# Patient Record
Sex: Female | Born: 1950 | Race: White | Hispanic: No | Smoking: Former smoker
Health system: Southern US, Community
[De-identification: ages and names within clinical notes are randomized; demographics above are authoritative.]

## PROBLEM LIST (undated history)

## (undated) DIAGNOSIS — IMO0002 Reserved for concepts with insufficient information to code with codable children: Secondary | ICD-10-CM

## (undated) DIAGNOSIS — N952 Postmenopausal atrophic vaginitis: Secondary | ICD-10-CM

## (undated) DIAGNOSIS — K7581 Nonalcoholic steatohepatitis (NASH): Secondary | ICD-10-CM

## (undated) DIAGNOSIS — I7300 Raynaud's syndrome without gangrene: Secondary | ICD-10-CM

## (undated) DIAGNOSIS — N814 Uterovaginal prolapse, unspecified: Secondary | ICD-10-CM

## (undated) DIAGNOSIS — I10 Essential (primary) hypertension: Secondary | ICD-10-CM

## (undated) DIAGNOSIS — G629 Polyneuropathy, unspecified: Secondary | ICD-10-CM

## (undated) DIAGNOSIS — R7303 Prediabetes: Secondary | ICD-10-CM

## (undated) DIAGNOSIS — F419 Anxiety disorder, unspecified: Secondary | ICD-10-CM

## (undated) DIAGNOSIS — J45909 Unspecified asthma, uncomplicated: Secondary | ICD-10-CM

## (undated) DIAGNOSIS — N816 Rectocele: Secondary | ICD-10-CM

## (undated) DIAGNOSIS — F32A Depression, unspecified: Secondary | ICD-10-CM

## (undated) DIAGNOSIS — N950 Postmenopausal bleeding: Secondary | ICD-10-CM

## (undated) DIAGNOSIS — L309 Dermatitis, unspecified: Secondary | ICD-10-CM

## (undated) DIAGNOSIS — M329 Systemic lupus erythematosus, unspecified: Secondary | ICD-10-CM

## (undated) DIAGNOSIS — N390 Urinary tract infection, site not specified: Secondary | ICD-10-CM

## (undated) DIAGNOSIS — I219 Acute myocardial infarction, unspecified: Secondary | ICD-10-CM

## (undated) DIAGNOSIS — N898 Other specified noninflammatory disorders of vagina: Secondary | ICD-10-CM

## (undated) DIAGNOSIS — I2510 Atherosclerotic heart disease of native coronary artery without angina pectoris: Secondary | ICD-10-CM

## (undated) DIAGNOSIS — E785 Hyperlipidemia, unspecified: Secondary | ICD-10-CM

## (undated) DIAGNOSIS — R350 Frequency of micturition: Secondary | ICD-10-CM

## (undated) DIAGNOSIS — M359 Systemic involvement of connective tissue, unspecified: Secondary | ICD-10-CM

## (undated) DIAGNOSIS — N3289 Other specified disorders of bladder: Secondary | ICD-10-CM

## (undated) DIAGNOSIS — M1990 Unspecified osteoarthritis, unspecified site: Secondary | ICD-10-CM

## (undated) DIAGNOSIS — K219 Gastro-esophageal reflux disease without esophagitis: Secondary | ICD-10-CM

## (undated) DIAGNOSIS — N393 Stress incontinence (female) (male): Secondary | ICD-10-CM

## (undated) HISTORY — DX: Rectocele: N81.6

## (undated) HISTORY — DX: Anxiety disorder, unspecified: F41.9

## (undated) HISTORY — DX: Postmenopausal bleeding: N95.0

## (undated) HISTORY — PX: CARDIAC CATHETERIZATION: SHX172

## (undated) HISTORY — DX: Unspecified asthma, uncomplicated: J45.909

## (undated) HISTORY — DX: Hyperlipidemia, unspecified: E78.5

## (undated) HISTORY — DX: Depression, unspecified: F32.A

## (undated) HISTORY — PX: OTHER SURGICAL HISTORY: SHX169

## (undated) HISTORY — DX: Essential (primary) hypertension: I10

## (undated) HISTORY — DX: Stress incontinence (female) (male): N39.3

## (undated) HISTORY — PX: CORONARY ANGIOPLASTY: SHX604

## (undated) HISTORY — DX: Other specified disorders of bladder: N32.89

## (undated) HISTORY — DX: Reserved for concepts with insufficient information to code with codable children: IMO0002

## (undated) HISTORY — DX: Uterovaginal prolapse, unspecified: N81.4

## (undated) HISTORY — DX: Other specified noninflammatory disorders of vagina: N89.8

## (undated) HISTORY — DX: Postmenopausal atrophic vaginitis: N95.2

## (undated) HISTORY — DX: Frequency of micturition: R35.0

---
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
## 1984-02-13 HISTORY — PX: TUBAL LIGATION: SHX77

## 2004-08-28 ENCOUNTER — Emergency Department: Payer: Self-pay | Admitting: Emergency Medicine

## 2005-08-22 ENCOUNTER — Ambulatory Visit: Payer: Self-pay | Admitting: Internal Medicine

## 2006-02-14 ENCOUNTER — Ambulatory Visit: Payer: Self-pay | Admitting: Nurse Practitioner

## 2009-03-07 ENCOUNTER — Ambulatory Visit: Payer: Self-pay | Admitting: Urology

## 2009-09-21 ENCOUNTER — Ambulatory Visit: Payer: Self-pay | Admitting: Nurse Practitioner

## 2011-01-10 ENCOUNTER — Ambulatory Visit: Payer: Self-pay | Admitting: Nurse Practitioner

## 2011-09-15 ENCOUNTER — Emergency Department: Payer: Self-pay | Admitting: Emergency Medicine

## 2011-09-15 LAB — CBC
HCT: 37.1 % (ref 35.0–47.0)
MCH: 30.9 pg (ref 26.0–34.0)
MCHC: 35 g/dL (ref 32.0–36.0)
MCV: 88 fL (ref 80–100)
RDW: 14.1 % (ref 11.5–14.5)

## 2011-09-15 LAB — COMPREHENSIVE METABOLIC PANEL
Albumin: 3.5 g/dL (ref 3.4–5.0)
Alkaline Phosphatase: 74 U/L (ref 50–136)
Anion Gap: 11 (ref 7–16)
BUN: 14 mg/dL (ref 7–18)
Bilirubin,Total: 0.3 mg/dL (ref 0.2–1.0)
Calcium, Total: 8.9 mg/dL (ref 8.5–10.1)
Chloride: 108 mmol/L — ABNORMAL HIGH (ref 98–107)
Co2: 26 mmol/L (ref 21–32)
Creatinine: 0.96 mg/dL (ref 0.60–1.30)
EGFR (African American): >60
EGFR (Non-African Amer.): >60
Osmolality: 292 (ref 275–301)
Potassium: 3.5 mmol/L (ref 3.5–5.1)
Sodium: 145 mmol/L (ref 136–145)

## 2011-09-15 LAB — TROPONIN I
Troponin-I: <0.02 ng/mL
Troponin-I: <0.02 ng/mL

## 2011-09-15 LAB — LIPASE, BLOOD: Lipase: 198 U/L (ref 73–393)

## 2011-09-18 ENCOUNTER — Ambulatory Visit: Payer: Self-pay | Admitting: Cardiology

## 2011-09-18 DIAGNOSIS — I2510 Atherosclerotic heart disease of native coronary artery without angina pectoris: Secondary | ICD-10-CM

## 2011-09-18 HISTORY — PX: LEFT HEART CATH AND CORONARY ANGIOGRAPHY: CATH118249

## 2011-09-18 HISTORY — DX: Atherosclerotic heart disease of native coronary artery without angina pectoris: I25.10

## 2011-10-16 ENCOUNTER — Ambulatory Visit: Payer: Self-pay | Admitting: Cardiology

## 2011-10-16 HISTORY — PX: CORONARY STENT INTERVENTION: CATH118234

## 2011-10-16 LAB — CK TOTAL AND CKMB (NOT AT ARMC)
CK, Total: 97 U/L (ref 21–215)
CK-MB: 1.1 ng/mL (ref 0.5–3.6)

## 2011-10-17 LAB — BASIC METABOLIC PANEL
Anion Gap: 4 — ABNORMAL LOW (ref 7–16)
Calcium, Total: 8.7 mg/dL (ref 8.5–10.1)
Chloride: 110 mmol/L — ABNORMAL HIGH (ref 98–107)
Co2: 29 mmol/L (ref 21–32)
Osmolality: 284 (ref 275–301)

## 2012-03-06 ENCOUNTER — Ambulatory Visit: Payer: Self-pay | Admitting: Family Medicine

## 2013-08-31 DIAGNOSIS — I2510 Atherosclerotic heart disease of native coronary artery without angina pectoris: Secondary | ICD-10-CM | POA: Insufficient documentation

## 2013-08-31 DIAGNOSIS — M359 Systemic involvement of connective tissue, unspecified: Secondary | ICD-10-CM | POA: Insufficient documentation

## 2014-01-14 ENCOUNTER — Ambulatory Visit: Payer: Self-pay | Admitting: Obstetrics and Gynecology

## 2014-01-14 LAB — BASIC METABOLIC PANEL
Anion Gap: 8 (ref 7–16)
BUN: 11 mg/dL (ref 7–18)
CO2: 29 mmol/L (ref 21–32)
Calcium, Total: 9.3 mg/dL (ref 8.5–10.1)
Chloride: 106 mmol/L (ref 98–107)
Creatinine: 0.82 mg/dL (ref 0.60–1.30)
EGFR (Non-African Amer.): >60
Glucose: 91 mg/dL (ref 65–99)
Osmolality: 284 (ref 275–301)
POTASSIUM: 4.2 mmol/L (ref 3.5–5.1)
Sodium: 143 mmol/L (ref 136–145)

## 2014-01-14 LAB — CBC
HCT: 42.1 % (ref 35.0–47.0)
HGB: 14.1 g/dL (ref 12.0–16.0)
MCH: 30.9 pg (ref 26.0–34.0)
MCHC: 33.4 g/dL (ref 32.0–36.0)
MCV: 93 fL (ref 80–100)
Platelet: 256 10*3/uL (ref 150–440)
RBC: 4.55 10*6/uL (ref 3.80–5.20)
RDW: 13.5 % (ref 11.5–14.5)
WBC: 8.1 10*3/uL (ref 3.6–11.0)

## 2014-01-18 ENCOUNTER — Inpatient Hospital Stay: Payer: Self-pay | Admitting: Obstetrics and Gynecology

## 2014-01-18 HISTORY — PX: OTHER SURGICAL HISTORY: SHX169

## 2014-01-18 LAB — PROTIME-INR
INR: 1.1
PROTHROMBIN TIME: 13.6 s (ref 11.5–14.7)

## 2014-01-19 HISTORY — PX: TOTAL ABDOMINAL HYSTERECTOMY W/ BILATERAL SALPINGOOPHORECTOMY: SHX83

## 2014-01-19 HISTORY — PX: ABDOMINAL HYSTERECTOMY: SHX81

## 2014-01-19 LAB — BASIC METABOLIC PANEL WITH GFR
Anion Gap: 5 — ABNORMAL LOW
BUN: 11 mg/dL
Calcium, Total: 8.1 mg/dL — ABNORMAL LOW
Chloride: 105 mmol/L
Co2: 28 mmol/L
Creatinine: 0.99 mg/dL
EGFR (African American): >60
EGFR (Non-African Amer.): >60
Glucose: 110 mg/dL — ABNORMAL HIGH
Osmolality: 276
Potassium: 3.7 mmol/L
Sodium: 138 mmol/L

## 2014-01-19 LAB — CBC WITH DIFFERENTIAL/PLATELET
Basophil #: 0 x10 3/mm 3
Basophil %: 0.1 %
Eosinophil #: 0 x10 3/mm 3
Eosinophil %: 0 %
HCT: 29.1 % — ABNORMAL LOW
HGB: 9.3 g/dL — ABNORMAL LOW
Lymphocyte %: 9.1 %
Lymphs Abs: 1.6 x10 3/mm 3
MCH: 30.2 pg
MCHC: 31.9 g/dL — ABNORMAL LOW
MCV: 95 fL
Monocyte #: 1.3 "x10 3/mm " — ABNORMAL HIGH
Monocyte %: 7.3 %
Neutrophil #: 14.9 x10 3/mm 3 — ABNORMAL HIGH
Neutrophil %: 83.5 %
Platelet: 181 x10 3/mm 3
RBC: 3.08 X10 6/mm 3 — ABNORMAL LOW
RDW: 13.4 %
WBC: 17.8 x10 3/mm 3 — ABNORMAL HIGH

## 2014-01-20 LAB — HEMOGLOBIN: HGB: 9.5 g/dL — AB (ref 12.0–16.0)

## 2014-01-20 LAB — CREATININE, SERUM
Creatinine: 0.94 mg/dL (ref 0.60–1.30)
EGFR (Non-African Amer.): >60

## 2014-01-22 LAB — CREATININE, SERUM
CREATININE: 0.77 mg/dL (ref 0.60–1.30)
EGFR (African American): >60
EGFR (Non-African Amer.): >60

## 2014-01-24 LAB — CREATININE, SERUM
Creatinine: 0.91 mg/dL (ref 0.60–1.30)
EGFR (African American): >60

## 2014-01-24 LAB — VANCOMYCIN, TROUGH: Vancomycin, Trough: 11 ug/mL (ref 10–20)

## 2014-01-25 LAB — CREATININE, SERUM
Creatinine: 1 mg/dL (ref 0.60–1.30)
EGFR (African American): >60
EGFR (Non-African Amer.): 60 — ABNORMAL LOW

## 2014-01-26 LAB — CBC WITH DIFFERENTIAL/PLATELET
BANDS NEUTROPHIL: 3 %
Eosinophil: 4 %
HCT: 28.1 % — AB (ref 35.0–47.0)
HGB: 9.4 g/dL — ABNORMAL LOW (ref 12.0–16.0)
Lymphocytes: 19 %
MCH: 31.1 pg (ref 26.0–34.0)
MCHC: 33.6 g/dL (ref 32.0–36.0)
MCV: 92 fL (ref 80–100)
METAMYELOCYTE: 1 %
MONOS PCT: 2 %
Myelocyte: 1 %
Platelet: 385 10*3/uL (ref 150–440)
RBC: 3.04 10*6/uL — ABNORMAL LOW (ref 3.80–5.20)
RDW: 13.6 % (ref 11.5–14.5)
Segmented Neutrophils: 70 %
WBC: 6.4 10*3/uL (ref 3.6–11.0)

## 2014-01-26 LAB — CREATININE, SERUM
Creatinine: 1.08 mg/dL (ref 0.60–1.30)
GFR CALC NON AF AMER: 55 — AB

## 2014-01-26 LAB — VANCOMYCIN, TROUGH
VANCOMYCIN, TROUGH: 17 ug/mL (ref 10–20)
Vancomycin, Trough: 26 ug/mL (ref 10–20)
Vancomycin, Trough: 30 ug/mL (ref 10–20)

## 2014-01-27 LAB — URINALYSIS, COMPLETE
BILIRUBIN, UR: NEGATIVE
Glucose,UR: NEGATIVE mg/dL (ref 0–75)
Hyaline Cast: 2
Ketone: NEGATIVE
NITRITE: NEGATIVE
PH: 6 (ref 4.5–8.0)
RBC,UR: 245 /HPF (ref 0–5)
Specific Gravity: 1.018 (ref 1.003–1.030)
Squamous Epithelial: NONE SEEN

## 2014-01-27 LAB — CBC WITH DIFFERENTIAL/PLATELET
Bands: 11 %
Comment - H1-Com2: NORMAL
Eosinophil: 3 %
HCT: 26.4 % — ABNORMAL LOW (ref 35.0–47.0)
HGB: 8.6 g/dL — ABNORMAL LOW (ref 12.0–16.0)
Lymphocytes: 12 %
MCH: 30.3 pg (ref 26.0–34.0)
MCHC: 32.5 g/dL (ref 32.0–36.0)
MCV: 93 fL (ref 80–100)
Metamyelocyte: 5 %
Monocytes: 6 %
Myelocyte: 5 %
Platelet: 305 10*3/uL (ref 150–440)
RBC: 2.83 10*6/uL — AB (ref 3.80–5.20)
RDW: 13.1 % (ref 11.5–14.5)
SEGMENTED NEUTROPHILS: 58 %
WBC: 6.3 10*3/uL (ref 3.6–11.0)

## 2014-01-27 LAB — COMPREHENSIVE METABOLIC PANEL
ALK PHOS: 60 U/L
ALT: 31 U/L
Albumin: 2.4 g/dL — ABNORMAL LOW (ref 3.4–5.0)
Anion Gap: 5 — ABNORMAL LOW (ref 7–16)
BUN: 12 mg/dL (ref 7–18)
Bilirubin,Total: 0.2 mg/dL (ref 0.2–1.0)
CALCIUM: 8.3 mg/dL — AB (ref 8.5–10.1)
CHLORIDE: 103 mmol/L (ref 98–107)
CREATININE: 0.98 mg/dL (ref 0.60–1.30)
Co2: 29 mmol/L (ref 21–32)
EGFR (African American): >60
EGFR (Non-African Amer.): >60
GLUCOSE: 94 mg/dL (ref 65–99)
Osmolality: 273 (ref 275–301)
Potassium: 4.1 mmol/L (ref 3.5–5.1)
SGOT(AST): 36 U/L (ref 15–37)
Sodium: 137 mmol/L (ref 136–145)
Total Protein: 6.8 g/dL (ref 6.4–8.2)

## 2014-01-28 LAB — URINE CULTURE

## 2014-01-29 LAB — COMPREHENSIVE METABOLIC PANEL
ALBUMIN: 2.4 g/dL — AB (ref 3.4–5.0)
ALT: 39 U/L
Alkaline Phosphatase: 55 U/L
Anion Gap: 8 (ref 7–16)
BILIRUBIN TOTAL: 0.2 mg/dL (ref 0.2–1.0)
BUN: 14 mg/dL (ref 7–18)
CHLORIDE: 103 mmol/L (ref 98–107)
CREATININE: 1.15 mg/dL (ref 0.60–1.30)
Calcium, Total: 8.1 mg/dL — ABNORMAL LOW (ref 8.5–10.1)
Co2: 24 mmol/L (ref 21–32)
EGFR (African American): >60
EGFR (Non-African Amer.): 51 — ABNORMAL LOW
GLUCOSE: 93 mg/dL (ref 65–99)
OSMOLALITY: 270 (ref 275–301)
Potassium: 3.9 mmol/L (ref 3.5–5.1)
SGOT(AST): 52 U/L — ABNORMAL HIGH (ref 15–37)
Sodium: 135 mmol/L — ABNORMAL LOW (ref 136–145)
TOTAL PROTEIN: 6.6 g/dL (ref 6.4–8.2)

## 2014-01-29 LAB — CBC WITH DIFFERENTIAL/PLATELET
Bands: 7 %
Comment - H1-Com1: NORMAL
Comment - H1-Com2: NORMAL
Eosinophil: 1 %
HCT: 24.3 % — ABNORMAL LOW (ref 35.0–47.0)
HGB: 8 g/dL — ABNORMAL LOW (ref 12.0–16.0)
LYMPHS PCT: 18 %
MCH: 30.6 pg (ref 26.0–34.0)
MCHC: 33 g/dL (ref 32.0–36.0)
MCV: 93 fL (ref 80–100)
MYELOCYTE: 2 %
Metamyelocyte: 7 %
Monocytes: 5 %
PLATELETS: 279 10*3/uL (ref 150–440)
RBC: 2.62 10*6/uL — ABNORMAL LOW (ref 3.80–5.20)
RDW: 13.4 % (ref 11.5–14.5)
Segmented Neutrophils: 60 %
WBC: 4.1 10*3/uL (ref 3.6–11.0)

## 2014-01-29 LAB — VANCOMYCIN, TROUGH: VANCOMYCIN, TROUGH: 22 ug/mL — AB (ref 10–20)

## 2014-01-30 LAB — SEDIMENTATION RATE: Erythrocyte Sed Rate: 128 mm/hr — ABNORMAL HIGH (ref 0–30)

## 2014-01-30 LAB — CBC WITH DIFFERENTIAL/PLATELET
Basophil #: 0 10*3/uL (ref 0.0–0.1)
Basophil %: 0.5 %
EOS ABS: 0 10*3/uL (ref 0.0–0.7)
Eosinophil %: 0.3 %
HCT: 24 % — ABNORMAL LOW (ref 35.0–47.0)
HGB: 8.2 g/dL — AB (ref 12.0–16.0)
LYMPHS PCT: 12.9 %
Lymphocyte #: 0.8 10*3/uL — ABNORMAL LOW (ref 1.0–3.6)
MCH: 30.8 pg (ref 26.0–34.0)
MCHC: 34.1 g/dL (ref 32.0–36.0)
MCV: 90 fL (ref 80–100)
MONOS PCT: 3.1 %
Monocyte #: 0.2 x10 3/mm (ref 0.2–0.9)
Neutrophil #: 4.9 10*3/uL (ref 1.4–6.5)
Neutrophil %: 83.2 %
PLATELETS: 323 10*3/uL (ref 150–440)
RBC: 2.66 10*6/uL — ABNORMAL LOW (ref 3.80–5.20)
RDW: 13.7 % (ref 11.5–14.5)
WBC: 5.9 10*3/uL (ref 3.6–11.0)

## 2014-01-30 LAB — BASIC METABOLIC PANEL
Anion Gap: 9 (ref 7–16)
BUN: 18 mg/dL (ref 7–18)
CHLORIDE: 100 mmol/L (ref 98–107)
Calcium, Total: 8 mg/dL — ABNORMAL LOW (ref 8.5–10.1)
Co2: 22 mmol/L (ref 21–32)
Creatinine: 1.29 mg/dL (ref 0.60–1.30)
GFR CALC AF AMER: 54 — AB
GFR CALC NON AF AMER: 45 — AB
Glucose: 100 mg/dL — ABNORMAL HIGH (ref 65–99)
OSMOLALITY: 265 (ref 275–301)
POTASSIUM: 3.7 mmol/L (ref 3.5–5.1)
Sodium: 131 mmol/L — ABNORMAL LOW (ref 136–145)

## 2014-01-30 LAB — LACTATE DEHYDROGENASE: LDH: 466 U/L — AB (ref 81–246)

## 2014-01-31 LAB — VANCOMYCIN, TROUGH: VANCOMYCIN, TROUGH: 11 ug/mL (ref 10–20)

## 2014-01-31 LAB — CBC WITH DIFFERENTIAL/PLATELET
Bands: 22 %
COMMENT - H1-COM3: NORMAL
HCT: 25.2 % — ABNORMAL LOW (ref 35.0–47.0)
HGB: 8.5 g/dL — ABNORMAL LOW (ref 12.0–16.0)
Lymphocytes: 7 %
MCH: 30.7 pg (ref 26.0–34.0)
MCHC: 33.7 g/dL (ref 32.0–36.0)
MCV: 91 fL (ref 80–100)
Metamyelocyte: 8 %
Monocytes: 4 %
Myelocyte: 1 %
PLATELETS: 332 10*3/uL (ref 150–440)
RBC: 2.76 10*6/uL — ABNORMAL LOW (ref 3.80–5.20)
RDW: 13.8 % (ref 11.5–14.5)
Segmented Neutrophils: 58 %
WBC: 5 10*3/uL (ref 3.6–11.0)

## 2014-01-31 LAB — INFLUENZA A,B,H1N1 - PCR (ARMC)
H1N1 flu by pcr: NOT DETECTED
Influenza A By PCR: NEGATIVE
Influenza B By PCR: NEGATIVE

## 2014-01-31 LAB — CULTURE, BLOOD (SINGLE)

## 2014-01-31 LAB — CREATININE, SERUM
CREATININE: 1.2 mg/dL (ref 0.60–1.30)
Creatinine: 0.78 mg/dL (ref 0.60–1.30)
EGFR (African American): >60
EGFR (Non-African Amer.): >60
GFR CALC AF AMER: 59 — AB
GFR CALC NON AF AMER: 48 — AB

## 2014-02-01 LAB — CULTURE, BLOOD (SINGLE)

## 2014-02-01 LAB — CK: CK, TOTAL: 79 U/L (ref 26–192)

## 2014-02-02 LAB — CBC WITH DIFFERENTIAL/PLATELET
BANDS NEUTROPHIL: 4 %
Basophil: 2 %
COMMENT - H1-COM1: NORMAL
COMMENT - H1-COM2: NORMAL
Eosinophil: 3 %
HCT: 23.7 % — AB (ref 35.0–47.0)
HGB: 7.7 g/dL — AB (ref 12.0–16.0)
Lymphocytes: 22 %
MCH: 29.8 pg (ref 26.0–34.0)
MCHC: 32.3 g/dL (ref 32.0–36.0)
MCV: 92 fL (ref 80–100)
METAMYELOCYTE: 3 %
Monocytes: 6 %
Myelocyte: 2 %
Platelet: 405 10*3/uL (ref 150–440)
RBC: 2.57 10*6/uL — ABNORMAL LOW (ref 3.80–5.20)
RDW: 13.8 % (ref 11.5–14.5)
Segmented Neutrophils: 58 %
WBC: 5.5 10*3/uL (ref 3.6–11.0)

## 2014-02-02 LAB — CLOSTRIDIUM DIFFICILE(ARMC)

## 2014-02-02 LAB — RETICULOCYTES
Absolute Retic Count: 0.0566 10*6/uL (ref 0.019–0.186)
RETICULOCYTE: 2.22 % (ref 0.4–3.1)

## 2014-02-02 LAB — BASIC METABOLIC PANEL
Anion Gap: 6 — ABNORMAL LOW (ref 7–16)
BUN: 11 mg/dL (ref 7–18)
Calcium, Total: 8.6 mg/dL (ref 8.5–10.1)
Chloride: 110 mmol/L — ABNORMAL HIGH (ref 98–107)
Co2: 26 mmol/L (ref 21–32)
Creatinine: 0.89 mg/dL (ref 0.60–1.30)
EGFR (Non-African Amer.): >60
Glucose: 86 mg/dL (ref 65–99)
Osmolality: 282 (ref 275–301)
Potassium: 4.3 mmol/L (ref 3.5–5.1)
Sodium: 142 mmol/L (ref 136–145)

## 2014-02-02 LAB — FERRITIN: Ferritin (ARMC): 1554 ng/mL — ABNORMAL HIGH (ref 8–388)

## 2014-02-02 LAB — OCCULT BLOOD X 1 CARD TO LAB, STOOL: Occult Blood, Feces: NEGATIVE

## 2014-02-03 LAB — CBC WITH DIFFERENTIAL/PLATELET
Bands: 4 %
Comment - H1-Com2: NORMAL
Eosinophil: 3 %
HCT: 25.8 % — ABNORMAL LOW (ref 35.0–47.0)
HGB: 8.4 g/dL — ABNORMAL LOW (ref 12.0–16.0)
Lymphocytes: 20 %
MCH: 30.1 pg (ref 26.0–34.0)
MCHC: 32.7 g/dL (ref 32.0–36.0)
MCV: 92 fL (ref 80–100)
Metamyelocyte: 6 %
Monocytes: 11 %
Myelocyte: 2 %
Platelet: 493 10*3/uL — ABNORMAL HIGH (ref 150–440)
RBC: 2.8 10*6/uL — ABNORMAL LOW (ref 3.80–5.20)
RDW: 13.9 % (ref 11.5–14.5)
Segmented Neutrophils: 54 %
WBC: 9.6 10*3/uL (ref 3.6–11.0)

## 2014-02-03 LAB — CULTURE, BLOOD (SINGLE)

## 2014-03-26 ENCOUNTER — Ambulatory Visit: Payer: Self-pay | Admitting: Urology

## 2014-06-01 ENCOUNTER — Ambulatory Visit
Admit: 2014-06-01 | Disposition: A | Discharge status: Home / Self Care | Payer: Self-pay | Attending: Family Medicine | Admitting: Family Medicine

## 2014-06-01 NOTE — Discharge Summary (Signed)
PATIENT NAME:  [name], [name] MR#:  [id number] DATE OF BIRTH:  13-Aug-1950  DATE OF ADMISSION:  10/16/2011 DATE OF DISCHARGE:  10/17/2011  PRIMARY CARE PHYSICIAN:  Dr. Kary Kos CARDIOLOGIST:  Dr. Ubaldo Glassing   FINAL DIAGNOSES: 1. Coronary artery disease  2. Connective tissue disease. 3. Raynaud's.  DISCHARGE MEDICATIONS:  1. Aspirin 325 mg daily.  2. Clopidogrel 75 mg daily.  3. Effexor 37.5 mg daily.  4. Multivitamin 1 daily.  5. Estrace vaginal cream 0.1 mg/g.  6. Vitamin D3 2000 international units daily.  7. Fish oil caps 500 mg daily.  8. Glucosamine 750 mg daily.  9. Prednisone 5 mg daily.  10. Amlodipine 2.5 mg daily.  11. Hydroxychloroquine 200 mg daily.  12. Detrol 2 mg b.i.d.  13. Venlafaxine 2 tabs daily.  PROCEDURES: Percutaneous coronary intervention with coronary stent on 10/16/2011.   HISTORY OF PRESENT ILLNESS: Please see admission History and Physical.   HOSPITAL COURSE: The patient underwent elective PCI on 10/16/2011. The patient received a 2.25 x 12-mm Xience coronary stent in the mid left anterior descending coronary artery with an excellent angiographic result. Postprocedural CPK and MB were 97 and 1.1, respectively.   On the morning of 10/17/2011 the patient was ambulating without difficulty and was discharged home in stable condition. She is scheduled to follow up with Dr. Ubaldo Glassing in one week.     ____________________________ Isaias Cowman, MD ap:bjt D: 10/17/2011 08:32:55 ET T: 10/18/2011 14:05:13 ET JOB#: [id number]  cc: Isaias Cowman, MD, <Dictator> Isaias Cowman MD ELECTRONICALLY SIGNED 11/04/2011 13:52

## 2014-06-05 NOTE — Consult Note (Signed)
Chief Complaint:  Subjective/Chief Complaint Spiked fever to 103 shortly after removing abd JP drain and nephrostomy tube.  Continues to spike tepts up to 104.6.  Normotensive but borderline tachycardia associated wtih fevers, currently afebrile.  Aztrezonam added to Vanc per ID, blood cultures and urine culture pending.  No flank pain.   VITAL SIGNS/ANCILLARY NOTES: **Vital Signs.:   16-Dec-15 07:33  Vital Signs Type Routine  Temperature Temperature (F) 98.9  Celsius 37.1  Temperature Source oral  Pulse Pulse 79  Respirations Respirations 18  Systolic BP Systolic BP 98  Diastolic BP (mmHg) Diastolic BP (mmHg) 58  Mean BP 71  Pulse Ox % Pulse Ox % 96  Pulse Ox Activity Level  At rest  Oxygen Delivery Room Air/ 21 %  *Intake and Output.:   Daily 16-Dec-15 07:00  Grand Totals Intake:  775 Output:  3500    Net:  -[id number] 24 Hr.:  -2725  IV (Primary)      In:  775  Urine ml     Out:  3500  Length of Stay Totals Intake:  13866 Output:  31885    Net:  -[id number]   Brief Assessment:  GEN well developed, well nourished   Cardiac Regular   Respiratory normal resp effort   Gastrointestinal Normal  soft, NT, ND. JP site and nephrostomy site c/d/i   Gastrointestinal details normal Soft  Nontender  No masses palpable  No rebound tenderness   EXTR negative cyanosis/clubbing   Additional Physical Exam Foley catheter in place draining clear yellow urine   Lab Results: Routine Micro:  15-Dec-15 10:41   Micro Text Report BLOOD CULTURE   COMMENT                   NO GROWTH IN 8-12 HOURS   ANTIBIOTIC                       Specimen Source l-arm  Culture Comment NO GROWTH IN 8-12 HOURS  Result(s) reported on 26 Jan 2014 at 06:00PM.    10:44   Micro Text Report BLOOD CULTURE   COMMENT                   NO GROWTH IN 8-12 HOURS   ANTIBIOTIC                       Specimen Source l-hand  Culture Comment NO GROWTH IN 8-12 HOURS  Result(s) reported on 26 Jan 2014 at 06:00PM.  Routine  Chem:  15-Dec-15 10:41   Result Comment PLATELETS - SLIGHT PLATELET CLUMPING IN SPECIMEN. ACTUAL  - NUMERICAL COUNT MAY BE SOMEWHAT HIGHER THAN  - THE REPORTED VALUE.  Result(s) reported on 26 Jan 2014 at 12:01PM.  Routine Hem:  15-Dec-15 10:41   WBC (CBC) 6.4  RBC (CBC)  3.04  Hemoglobin (CBC)  9.4  Hematocrit (CBC)  28.1  Platelet Count (CBC) 385  MCV 92  MCH 31.1  MCHC 33.6  RDW 13.6  Bands 3  Segmented Neutrophils 70  Lymphocytes 19  Monocytes 2  Eosinophil 4  Metamyelocyte 1  Myelocyte 1  Diff Comment 2 ANISOCYTOSIS  Diff Comment 3 HYPOCHROMIA  Diff Comment 4 PLTS VARIED IN SIZE  Result(s) reported on 26 Jan 2014 at 12:01PM.   Assessment/Plan:  Invasive Device Daily Assessment of Necessity:  Does the patient currently have any of the following indwelling devices? foley   Indwelling Urinary Catheter continued, requirement due to  Reason to continue Indwelling Urinary Catheter bladder injury   Assessment/Plan:  Assessment 64 year old female postop day 9 status post abdominal hysterectomy with intraoperative bladder injury near the level of the trigone.  Antegrade right double-J ureteral stent placed with distal ureteral perforation, unable to extract in antegrade fashion.  She is now POD2 s/p right ureteroscopy with extraction of malpositioned double-J, replacement.  Febrile yesterday after nephrostomy/ JP removed.  ID following, continues to spike fevers.   Plan -f/u cultures/ ID recs -Right double-J ureteral stent remains in good position -Foley catheter in place -Will arrange for cystogram and Foley catheter removal next week in urology clinic -Plan to maintain right ureteral stent for 4-6 weeks postop   Electronic Signatures: Sherlynn Stalls (MD)  (Signed 16-Dec-15 10:01)  Authored: Chief Complaint, VITAL SIGNS/ANCILLARY NOTES, Brief Assessment, Lab Results, Assessment/Plan   Last Updated: 16-Dec-15 10:01 by Sherlynn Stalls (MD)

## 2014-06-05 NOTE — Op Note (Signed)
PATIENT NAME:  [name], [name] MR#:  [id number] DATE OF BIRTH:  03/17/1950  DATE OF PROCEDURE:  01/18/2014  PREOPERATIVE DIAGNOSES: 1.  Leiomyoma uteri.  2.  Postmenopausal bleeding.   POSTOPERATIVE DIAGNOSES:  1.  Leiomyoma uteri.  2.  Postmenopausal bleeding.  3.  Pelvic adhesions.  4.  Incidental cystotomy.   OPERATIVE PROCEDURES:  1.  Total abdominal hysterectomy, bilateral salpingo-oophorectomy.  2.  Repair of cystotomy; cystoscopy with attempt at ureteral catheter placement.   ANESTHESIA: General endotracheal.   SURGEON: Alanda Slim. Kyrillos Adams, MD  FIRST ASSISTANT: Anika S. Marcelline Mates, MD  INTRAOPERATIVE CONSULTANT: Sherlynn Stalls, MD, urology.   INDICATIONS: The patient is a 64 year old white female, para 1,  0-0-1, who presents for definitive surgery regarding symptomatic fibroid uterus. The patient had episode of postmenopausal bleeding with subsequent endometrial biopsy being benign. Preoperative ultrasound demonstrated a multifibroid uterus.   FINDINGS AT SURGERY: 1.  Multifibroid uterus.  2.  Mild scarring at the bladder flap.  3.  Incidental cystotomy identified intraoperatively at the vaginal cuff.  4.  Grossly normal tubes and ovaries bilaterally.  5.  Bladder intact with sterile milk application verifying integrity of repair.  6.  Ureteral orifices demonstrated methylene blue efflux on cystoscopy. However, cannulation could not be completed through a cystoscope.   DESCRIPTION OF PROCEDURE: The patient was brought to the operating room, where she was placed in the supine position. General endotracheal anesthesia was induced without difficulty. A ChloraPrep and Betadine abdominal, perineal, intravaginal prep and drape was performed in standard fashion. A Foley catheter was placed and was draining clear yellow urine. The previous Pfannenstiel incision scar was excised. The subcutaneous tissues were incised down to the fascia. The fascia was incised transversely with  extension with Mayo scissors. Moderate scarring was encountered. The fascia was then dissected off of the rectus muscle through sharp and blunt dissection both superiorly and inferiorly. The midline raphe was identified, separated, and the peritoneum was entered. The intra-abdominal findings were noted. An O'Connor-O'Sullivan retractor was placed to facilitate exposure. The bowel was packed off with 5 wet laps. The uterine fundus was grasped with a double-tooth tenaculum. The round ligaments were doubly clamped, cut, and stick tied using 0 Vicryl suture. The anterior and posterior leafs of the broad ligament were opened. The infundibulopelvic ligament was isolated and undermined, and the ligaments were doubly clamped and cut. Care was to avoid the ureter. The proximal pedicle was doubly ligated using 0 Vicryl suture. The first stitch was a free tie with the second stitch being a suture ligature. The distal aspect of the ligament was tied off with a free tie which was then connected to the double-tooth tenaculum. This was done bilaterally. The bladder flap was created through sharp dissection. Mild to moderate scarring was encountered centrally in the bladder flap. The cardinal broad ligament complexes were then skeletonized to expose the uterine vessels. The uterine arteries were doubly clamped, cut and then stick tied using 0 Vicryl suture. The remainder of the cardinal broad ligament complexes were then clamped, cut, and stick tied in routine fashion down to the level of the cervicovaginal junction. At this point, curved Heaney clamps were placed at the angles and the specimen was then removed from the operative field. Please note that initially a supracervical excision was performed with the remainder of the stump of the cervix being worked on in a much easier fashion due to the bulkiness of the fibroids. The angles of the vagina were closed using a Richardson stitch  of 0 Vicryl. The intervening cuff was closed  with figure-of-eight sutures of 0 Vicryl. Upon removing the final Kocher clamp which was along the left vaginal margin, an expression of urine was noted which suggested cystotomy. Exploration of the vaginal cuff then did reveal a cystotomy. The intervening vaginal cuff closure sutures had to be removed. The bladder mucosa was identified. It was very finely attached to the anterior vaginal mucosa with minimal tissue separating the vagina and bladder. Intraoperative consultation was made to urology, with Dr. Hollice Espy responding. Please see her note for details of the bladder closure then. Basically, the bladder was closed in 2 layers using 3-0 Vicryl suture in a simple running manner followed by a 2-0 Vicryl suture in an imbricating closure. Sterile milk was placed in the bladder and leaking was noted. Further oversewing of the bladder had to be performed in order to optimize integrity of the bladder. Following additional closure sutures, the bladder did demonstrate an intact state with 150 mL of sterile milk. Following completion of the cystotomy closure, the remainder of the vaginal mucosa was closed using 2-0 Vicryl sutures in a figure-of-eight interrupted manner. This was followed by copious irrigation of the pelvis. Closure of the abdomen then ensued. All instruments, retractors, laps were removed. A Jackson-Pratt #15 drain was placed intra-abdominally in standard fashion. The fascia was closed with 0 Maxon in a simple running manner. The subcutaneous tissues were reapproximated using 2-0 Vicryl simple interrupted sutures. The skin was closed with a running subcuticular stitch of 4-0 Vicryl. Dermabond glue was placed over the incision. The drain was attached to the abdominal wall with 3-0 nylon.   Following completion of the abdominal procedure, cystoscopy was performed. The bladder was filled with 200 mL of lactated Ringer. The 30-degree cystoscope was used, followed by the 70-degree cystoscope to assess  the closure and assess for integrity of the ureteral orifices. Methylene blue was infused intravenously. Jets of urine were seen coming from what appeared to be the ureteral orifices on the left and right aspects of the closure. However, the cystotomy repair did involve the trigone. Dr. Erlene Quan was called in to try to place ureteral stents. Please see her note for details. The stents could not be placed intraoperatively and then the decision was made to possibly use interventional radiology to pass percutaneous stents. The patient was then awakened, extubated and taken to the recovery room.   ESTIMATED BLOOD LOSS: Approximately 250 mL.   INTRAVENOUS FLUIDS: Not quantified.   URINE OUTPUT: Not able to be quantified at the time of this dictation.  COUNTS: All instruments, needle, and sponge counts were verified as correct.  ANTIBIOTIC PROPHYLAXIS: Intraoperatively, the patient did receive Flagyl antibiotic prophylaxis with a second dose being given intraoperatively due to the length of the procedure. In addition, she did receive vancomycin 1 g IV for gram-positive coverage.  URINE OUTPUT: Please see Dr. Caryl Pina Brandon's note for the details of the cystotomy closure as well as her cystoscopy assessment.   ____________________________ Alanda Slim. Simran Bomkamp, MD mad:ST D: 01/18/2014 17:49:59 ET T: 01/18/2014 22:37:41 ET JOB#: [id number]  cc: Hassell Done A. Necole Minassian, MD, <Dictator> [facility] Sherlynn Stalls, MD Alanda Slim Kelia Gibbon MD ELECTRONICALLY SIGNED 01/29/2014 10:59

## 2014-06-05 NOTE — Consult Note (Signed)
Brief Consult Note: Patient was seen by consultant.   Consult note dictated.   Comments: 1 hx of Sjogrens-lupus (Raynauds, prior inflammatory arthritis) but no evidence of flare 2. elevated esr/crp most likely related to metamyelocytes/haptoglogulin/LDH/fever. could she have perinephric abscess? less likely PE or Drug fever as a cause.  Electronic Signatures: Leeanne Mannan., Eugene Gavia (MD)  (Signed 21-Dec-15 19:03)  Authored: Brief Consult Note   Last Updated: 21-Dec-15 19:03 by Leeanne Mannan., Eugene Gavia (MD)

## 2014-06-05 NOTE — Consult Note (Signed)
Chief Complaint:  Subjective/Chief Complaint no issues or flank pain overnight with nephrostomy tube capped.  No fevers or chills.   Excellent urine output via Foley catheter.   VITAL SIGNS/ANCILLARY NOTES: **Vital Signs.:   15-Dec-15 04:32  Vital Signs Type Routine  Temperature Temperature (F) 98.2  Celsius 36.7  Pulse Pulse 77  Respirations Respirations 18  Systolic BP Systolic BP 568  Diastolic BP (mmHg) Diastolic BP (mmHg) 61  Mean BP 74  Pulse Ox % Pulse Ox % 97  Pulse Ox Activity Level  At rest  Oxygen Delivery Room Air/ 21 %   Brief Assessment:  GEN well developed, well nourished   Cardiac Regular   Respiratory normal resp effort   Gastrointestinal Normal  soft, NT, ND.  JP drain scant, nephrostomy tube capped   Gastrointestinal details normal Soft  Nontender  No masses palpable  No rebound tenderness   EXTR negative cyanosis/clubbing   Additional Physical Exam Foley catheter in place draining clear yellow urine   Lab Results: TDMs:  15-Dec-15 05:30   Vancomycin, Trough LAB  26  Routine Chem:  15-Dec-15 05:30   Creatinine (comp) 1.08  eGFR (African American) >60  eGFR (Non-African American)  55 (eGFR values <84m/min/1.73 m2 may be an indication of chronic kidney disease (CKD). Calculated eGFR, using the MRDR Study equation, is useful in  patients with stable renal function. The eGFR calculation will not be reliable in acutely ill patients when serum creatinine is changing rapidly. It is not useful in patients on dialysis. The eGFR calculation may not be applicable to patients at the low and high extremes of body sizes, pregnant women, and vegetarians.)  Result Comment VANCOMYCIN - RESULTS VERIFIED BY REPEAT TESTING.  - NOTIFIED OF CRITICAL VALUE  - C/ MATT MCBANE _0  01-26-14..Marland KitchenJO  - READ-BACK PROCESS PERFORMED.  Result(s) reported on 26 Jan 2014 at 06:30AM.   Assessment/Plan:  Invasive Device Daily Assessment of Necessity:  Does the patient  currently have any of the following indwelling devices? foley   Indwelling Urinary Catheter continued, requirement due to   Reason to continue Indwelling Urinary Catheter bladder injury   Assessment/Plan:  Assessment 64year old female postop day 8 status post abdominal hysterectomy with intraoperative bladder injury near the level of the trigone.  Antegrade right double-J ureteral stent placed with distal ureteral perforation, unable to extract in antegrade fashion.  She returned yesterday to the operating room for right ureteroscopy with extraction of malpositioned double-J, replacement.  Nephrostomy Overnight with no flank pain, excellent urine output from Foley catheter.  Right nephrostomy tube and JP drain removed this morning on rounds.   Plan -Right nephrostomy and JP drain removed -Right double-J ureteral stent remains in good position -Foley catheter in place -okay for discharge from GU perspective -Will arrange for cystogram and Foley catheter removal next week in urology clinic -Plan to maintain right ureteral stent for 4-6 weeks postop -All questions were answered today   Electronic Signatures: BSherlynn Stalls(MD)  (Signed 15-Dec-15 08:59)  Authored: Chief Complaint, VITAL SIGNS/ANCILLARY NOTES, Brief Assessment, Lab Results, Assessment/Plan   Last Updated: 15-Dec-15 08:59 by BSherlynn Stalls(MD)

## 2014-06-05 NOTE — Consult Note (Signed)
PATIENT NAME:  [name], [name] MR#:  [id number] DATE OF BIRTH:  September 11, 1950  DATE OF CONSULTATION:  02/01/2014  REFERRING PHYSICIAN:   CONSULTING PHYSICIAN:  Meda Klinefelter., MD  REASON FOR CONSULTATION: Elevated sedimentation rate.   HISTORY OF PRESENT ILLNESS: A 64 year old white female. She has a history of connective tissue disease. She has had positive ANA and Sjogren's antibodies. She had prior inflammatory arthritis. She had onset after aq pregnancy in 1986. She has been on Plaquenil with no major recurrence. She gets occasional Raynaud's. Prior echocardiogram in 2013 showed mild elevation of right ventricular systolic pressure. She had recent recurrent bladder infections. She had pelvic pain and was found to have fibroids. She had an abdominal hysterectomy. She developed bladder infection with a component of hydronephrosis, had to have a stent placed. She said she has had 4 procedures since then.   She has had recurrent fever. White count has been normal, but she has had elevated bands and metamyelocytes. Creatinine most recently was 1.2. Abdominal CT showed some perinephric inflammation thought to be hematoma or seroma. She has not had significant abdominal pain. Catheter has been out. She had elevated laboratories with LDH increased, haptoglobin increased, CRP 72, sedimentation rate 128. She has had night time fevers up to 102, but last night she did not have a fever. Hemoglobin has dropped to 8.5. She gets some diarrhea, but no blood in her stool. She has not had any abdominal pain.   She has not had any recent Raynaud's. No photosensitive skin rash. Does have some cough. CT shows infiltrate right lower lobe versus atelectasis. She has not had significant dry eyes. Does have dry mouth which she relates to medicines. There has been no alopecia. No oral ulcers   PAST MEDICAL HISTORY:  Connective tissue disease, eczema, coronary disease.   PAST SURGICAL HISTORY:  Carpal tunnel release,  tonsillectomy, tubal ligation, C-section.   FAMILY HISTORY: Negative for connective tissue disease.   SOCIAL HISTORY: No cigarettes or alcohol.   PHYSICAL EXAMINATION:  GENERAL: Pleasant female in no acute distress.  VITAL SIGNS:  Temperature 98, blood pressure 96/56, pulse 76, oxygen 97 on room air.  HEENT: Sclerae clear. Oropharynx clear.  NECK: No thyromegaly.  LUNGS:  She has crackles both bases, which did not clear with cough.  HEART:  No significant murmur.  ABDOMEN:  Mild right CVA tenderness. Left CVA nontender. Abdomen nontender, has bowel sounds.  EXTREMITIES:  Good distal pulses. No significant edema. Calves are nontender. Thighs are nontender.  MUSCULOSKELETAL: Good range of motion neck and shoulders. Hands without synovitis or telangiectasias.  No discoid lesions on the face. Knees without effusions. Hips move well. Symmetric reflexes and power.   IMPRESSION:  1.  Elevated sedimentation rate and CRP. Most likely associated with elevated metamyelocytes, fever, elevated LDH and haptoglobin, says inflammatory response. Raises the question of whether she still has a nidus of infection in her perinephric area. Less likely to have pulmonary embolism with lack of tachycardia or hypoxemia. Consider drug fever, but would not explain her other inflammatory markers.  2.  History of connective tissue disease which does not seem to be active. Sedimentation rate and CRP probably reflect other process.  3.  Anemia.  4.  Recent hysterectomy complicated by bladder injury and stent requirement for hydronephrosis.   RECOMMENDATIONS:  No need for further evaluation for connective tissue disease. No indication for empiric treatment with steroids or other immunosuppressants.     ____________________________ Meda Klinefelter., MD gwk:bu  D: 02/01/2014 19:13:00 ET T: 02/01/2014 19:36:06 ET JOB#: [id number]  cc: Hassell Done A. DeFrancesco, MD Irven Easterly. Kary Kos, MD Cheral Marker. Ola Spurr,  MD Meda Klinefelter., MD, <Dictator>    Ovidio Hanger MD ELECTRONICALLY SIGNED 02/02/2014 17:10

## 2014-06-05 NOTE — Consult Note (Signed)
Chief Complaint:  Subjective/Chief Complaint No issues this AM other than hot flashes.  UOP from Foley 300 ml overnight, 500 ml from right nephroureteral stent.  Not able to place left sided tube.  Drain output 150 ml.   VITAL SIGNS/ANCILLARY NOTES: **Vital Signs.:   08-Dec-15 11:01  Vital Signs Type Routine  Temperature Temperature (F) 98.3  Celsius 36.8  Temperature Source oral  Pulse Pulse 85  Respirations Respirations 18  Systolic BP Systolic BP 99  Diastolic BP (mmHg) Diastolic BP (mmHg) 60  Mean BP 73  Pulse Ox % Pulse Ox % 99  Pulse Ox Activity Level  At rest  Oxygen Delivery 1L  *Intake and Output.:   Shift 08-Dec-15 07:00  IV (Primary)      In:  1014  Total Intake per OR/Specials/[facility]      In:  2500  Urine ml     Out:  300  JP Drain ml     Out:  130  EBL ml     Out:  250  Total Output per OR/Specials/[facility] ml     Out:  125  Other Output ml     Out:  500  Length of Stay Totals Intake:  3514 Output:  1305    Net:  2209    Daily 07:00  Grand Totals Intake:  3514 Output:  1305    Net:  2209 24 Hr.:  2209  IV (Primary)      In:  1014  Total Intake per OR/Specials/[facility]      In:  2500  Urine ml     Out:  300  JP Drain ml     Out:  130  EBL ml     Out:  250  Total Output per OR/Specials/[facility] ml     Out:  125  Other Output ml     Out:  500  Length of Stay Totals Intake:  3514 Output:  1305    Net:  2209   Brief Assessment:  GEN well developed, well nourished, no acute distress   Cardiac -- LE edema   Respiratory clear BS   Gastrointestinal Normal   Gastrointestinal details normal Soft  Appropriately tender, wound c/d/i.  Drain with   EXTR negative cyanosis/clubbing   Additional Physical Exam right nephrourral tube in place draining light pink urine..  Foley catheter draining clear yellow urine.  JP with serous sanguinous drainage.   Lab Results: Routine Chem:  08-Dec-15 06:29   Glucose, Serum  110  BUN 11  Creatinine  (comp) 0.99  Sodium, Serum 138  Potassium, Serum 3.7  Chloride, Serum 105  CO2, Serum 28  Calcium (Total), Serum  8.1  Anion Gap  5  Osmolality (calc) 276  eGFR (African American) >60  eGFR (Non-African American) >60 (eGFR values <51m/min/1.73 m2 may be an indication of chronic kidney disease (CKD). Calculated eGFR, using the MRDR Study equation, is useful in  patients with stable renal function. The eGFR calculation will not be reliable in acutely ill patients when serum creatinine is changing rapidly. It is not useful in patients on dialysis. The eGFR calculation may not be applicable to patients at the low and high extremes of body sizes, pregnant women, and vegetarians.)  Routine Hem:  08-Dec-15 06:29   WBC (CBC)  17.8  RBC (CBC)  3.08  Hemoglobin (CBC)  9.3  Hematocrit (CBC)  29.1  Platelet Count (CBC) 181  MCV 95  MCH 30.2  MCHC  31.9  RDW 13.4  Neutrophil %  83.5  Lymphocyte % 9.1  Monocyte % 7.3  Eosinophil % 0.0  Basophil % 0.1  Neutrophil #  14.9  Lymphocyte # 1.6  Monocyte #  1.3  Eosinophil # 0.0  Basophil # 0.0 (Result(s) reported on 19 Jan 2014 at 07:16AM.)   Radiology Results: Vascular:    07-Dec-15 20:26, Tube Placement - Nephrostomy  Tube Placement - Nephrostomy   REASON FOR EXAM:    Insert RM OR  COMMENTS:       PROCEDURE: VAS - NEPHROSTOMY TUBE PLACEMENT  - Jan 18 2014  8:26PM     CLINICAL DATA:  64 year old female, status post abdominal  hysterectomy with bladder injury.    She has been referred for bilateral percutaneous nephrostomy tube  after failure of retrograde ureteral stent placement in the  operating room.    Indication is for stenting the ureteral orifice to assure patency,  as well as diverting urine from the bladder injury.  EXAM:  1. ULTRASOUND GUIDANCE FOR PUNCTURE OF THE BILATERAL RENAL  COLLECTING SYSTEM.  2. BILATERAL PERCUTANEOUS NEPHROSTOMY TUBE PLACEMENT ATTEMPT.  3. RIGHT SIDED ANTEGRADE URETERAL STENT  PLACEMENT.    COMPARISON:  None.    ANESTHESIA/SEDATION:  GENERAL ENDOTRACHEAL TUBE ANESTHESIA PROVIDED BY ANESTHESIA TEAM.    CONTRAST:  One hundred sixty ml Omnipaque 300 IV    MEDICATIONS:  Vancomycin. Antibiotic was administered in an appropriate time frame  prior to skin puncture. The vancomycin was administered in the  perioperative time frame prior to the attempt at percutaneous  nephrostomy.    FLUOROSCOPY TIME:  Forty-two minutes.    PROCEDURE:  The procedure, risks, benefits, and alternatives were explained to  the patient family. The patient was a recovering from general  anesthesia from of operation on the same day, and the os the consent  form was completed with the assistance of the family member.  Questions regarding the procedure were encouraged and answered. The  patient's family understands and consents to the procedure.    The bilateral flank region was prepped with Betadine in a sterile  fashion, and a sterile drape was applied covering the operative  field. A sterile gown and sterile gloves were used for the  procedure. Local anesthesia was provided with 1% Lidocaine.    Initial attempt to access the right-sided collecting system was with  ultrasound and fluoroscopy. With difficulty again establish the  needle tip within the collecting system, an IV contrast dose of 80  cc of Omnipaque was administered for a pyelogram. The excretion of  the contrast was observed under fluoroscopy, and a direct stick  technique to the collecting system was performed with a [place] needle under fluoroscopy. Once we establish position of the  needle tip within the collecting system and confirmed with a small  amount of injection of contrast, air was instilled into the  collecting system to identify a posterior superior calyx. This calyx  was then punctured with a second stick using a [place] needle  under fluoroscopy.    An Accustick was then used to gain  access in the collecting system  and ureter. A 0.035 Amplatz wire was advanced. The Accustick sheath  was then removed. A 4 [place] Kumpe the catheter was then advanced  over the Amplatz wire to the urinary bladder. A measurement was then  completed to estimate the length of the ureteral stent. The estimate  was 22 cm. The Amplatz wire was then replaced through the Kumpe the  catheter and the Kumpe the catheter removed.    A 7 French sheath was then placed over the Amplatz wire. A coaxial,  safety wire (0.035 Bentson wire) was then placed in a coaxial manner  adjacent to the first Amplatz wire. The sheath was removed.    Antegrade ureteral stent placement was then achieved onthe Amplatz  wire, forming the loop distally within the urinary bladder and the  loop proximally within the collecting system.    We then placed a 12 French percutaneous nephrostomy tube over the  Bentson wire, forming the loop within the collecting system. We  confirmed the position of the catheter by injecting a small amount  of contrast.    We then turned our attention to the left kidney.    Similar techniques were performed in an attempt to access the left  collecting system. This included ultrasound guidance, fluoroscopic  guidance, and infusion of a second IV contrast bolus of 80 cc of  Omnipaque for pyelogram. Access into the left collecting system was  not achieved.  Before concluding the procedure, there was a person to person  conversation with Dr. Enzo Bi about potential options. Because  there was previous confirmation of patency of the distal left ureter  at the ureteral orifice in the operating room, we decided to forego  further attempts to access the will left collecting system, with the  possibility of a second attempt at a later time.    The patient tolerated the procedure well and remained  hemodynamically stable throughout.    No complications were encountered and no significant blood  loss was  encountered.    By the completion of the case, the urine draining from the right  collecting system was beginning to clear with a pink tinge.  The catheter was secured at the skin with a Prolene retention suture  and Stat-Lock device. A gravity bag was placed.    COMPLICATIONS:  None.    FINDINGS:  Decompressed bilateral collecting system with no evidence of hydro  or caliectasis.    After placement of the percutaneous nephrostomy into the right  collecting system, limited antegrade ureterogram demonstrates  patency of the distal right ureter.    Antegrade right-sided ureteral stent via percutaneous access with  placement of a 22 cm double-J stent.    Placement of a percutaneous nephrostomy into the right collecting  system with a 12 French pigtail catheter. Contrast confirms the  pigtail within the collecting system of the right kidney, with  incompletely formed loop secondary to the decompressed system.     IMPRESSION:  Status post ultrasound and fluoroscopic guided right percutaneous  nephrostomy tube utilizing IV pyelogram and double stick technique.  An antegrade 22 cm double-J ureteral stent was placed as well as a  12 French pigtail percutaneous nephrostomy catheter.    Unsuccessful placement of left percutaneous nephrostomy new, given  that the collecting system is decompressed with no evidence of  caliectasis or hydronephrosis.    PLAN:  The patient's right percutaneous nephrostomy should be followed for  clearing of the urine.    Options were directly discussed with the patient's physician Dr.  Enzo Bi. We could potentially attempt again at left collecting  system access at a later date if there is further need to stent the  left ureter. Alternatively, urology may find it useful to exchange  the right-sided ureteral stent, and atthat time attempt a  retrograde placement of the left ureteral stent.    Once we have confirmed  that the right  percutaneous nephrostomy tube  urine has cleared, and that urine no longer needs to be diverted  from the urinary bladder, the tube could be capped. This will be a  trial of the stent patency for a future removal of the right  percutaneous nephrostomy tube.    Signed,    Dulcy Fanny. Earleen Newport, DO    [facility]    [facility]      Electronically Signed    By: Corrie Mckusick D.O.    On: 01/19/2014 07:49         Verified By: Johny Shears, M.D.,   Assessment/Plan:  Invasive Device Daily Assessment of Necessity:  Does the patient currently have any of the following indwelling devices? foley   Indwelling Urinary Catheter continued, requirement due to   Assessment/Plan:  Assessment 64 year old postop day 1 status post abdominal hysterectomy, intraoperative bladder injury near the level of the trigone, unsuccessful retrograde ureteral stent placement and unable to visualize UO's cystoscopically, however efflux of methylene blue was identified.  She was taken by interventional radiology for placement of a right nephroureteral stent, however, unable to place a left-sided tube.  She is making urine via the Foley catheter is reassuring that her left side is at least partially draining this morning.  Creatinine 0.99. - recommend renal ultrasound this afternoon to evaluate for left hydronephrosis -Leave Foley catheter in place ??2 weeks with a cystogram prior to removal -Monitor JP output, once minimal will check drain creatinine -Will likely internalization of right nephroureteral stent in the next few days prior to discharge -I will continue to follow this patient closely   Electronic Signatures: Sherlynn Stalls (MD)  (Signed 08-Dec-15 11:29)  Authored: Chief Complaint, VITAL SIGNS/ANCILLARY NOTES, Brief Assessment, Lab Results, Radiology Results, Assessment/Plan   Last Updated: 08-Dec-15 11:29 by Sherlynn Stalls (MD)

## 2014-06-05 NOTE — Consult Note (Signed)
Chief Complaint:  Subjective/Chief Complaint Taken to the OR for purpose of internalization of double-J stent (  from imaging, it appeared  that a nephroureteral stent had been placed).  Upon cystoscopy, distal coil of the stent could not be seen within the bladder  concerning for malposition.  Nephrostomy clamped at the time of procedure the patient developed flank pain, renal ultrasound indicative of mild right hydroureter therefore nephrostomy was opened overnight.  No nausea or vomiting this morning.  No further flank pain, fevers or chills.  Low-grade temp to 100.2 overnight.   VITAL SIGNS/ANCILLARY NOTES: **Vital Signs.:   11-Dec-15 08:12  Vital Signs Type Routine  Temperature Temperature (F) 99.5  Celsius 37.5  Pulse Pulse 88  Respirations Respirations 16  Systolic BP Systolic BP 941  Diastolic BP (mmHg) Diastolic BP (mmHg) 69  Mean BP 92  Pulse Ox % Pulse Ox % 100  Pulse Ox Activity Level  At rest  Oxygen Delivery Room Air/ 21 %  *Intake and Output.:   Daily 11-Dec-15 07:00  Grand Totals Intake:  4134 Output:  3525    Net:  609 24 Hr.:  609  Oral Intake      In:  360  IV (Primary)      In:  3524  IV (Secondary)      In:  250  Urine ml     Out:  2265  JP Drain ml     Out:  60  Other Output ml     Out:  1200  Length of Stay Totals Intake:  11741 Output:  13390    Net:  -1649   Brief Assessment:  GEN well developed, well nourished, no acute distress   Cardiac Regular  -- LE edema   Respiratory normal resp effort  clear BS  no use of accessory muscles   Gastrointestinal Normal   Gastrointestinal details normal Soft  Nontender  Nondistended  Incision healing well, scant serous drainage from JP, Foley draining clear yellow urine, right nephrostomy light pink   EXTR negative cyanosis/clubbing   Lab Results: LabObservation:  07-Dec-15 20:26   OBSERVATION PACS Image [id number]  11-Dec-15 12:37   OBSERVATION Reason for Test ureteral stent  attempted retrieval  Routine Chem:  11-Dec-15 09:37   Creatinine (comp) 0.77  eGFR (African American) >60  eGFR (Non-African American) >60 (eGFR values <32m/min/1.73 m2 may be an indication of chronic kidney disease (CKD). Calculated eGFR, using the MRDR Study equation, is useful in  patients with stable renal function. The eGFR calculation will not be reliable in acutely ill patients when serum creatinine is changing rapidly. It is not useful in patients on dialysis. The eGFR calculation may not be applicable to patients at the low and high extremes of body sizes, pregnant women, and vegetarians.)   Assessment/Plan:  Invasive Device Daily Assessment of Necessity:  Does the patient currently have any of the following indwelling devices? foley   Indwelling Urinary Catheter continued, requirement due to   Reason to continue Indwelling Urinary Catheter bladder injury   Assessment/Plan:  Assessment 64year old POD4 s/p abdominal hysterectomy, intraoperative bladder injury near the level of the trigone, unsuccessful retrograde ureteral stent placement and unable to visualize UO's cystoscopically, however efflux of methylene blue was identified.  She was taken by interventional radiology for placement of a right nephroureteral stent (actually nephrostomy and antegrade JJ, overlapping on imaging), however, unable to place a left-sided tube.  She returned to the OR on postoperative day 2 for the purpose of internalization of  the stent, however, the distal coil of the stent was  not located in the bladder concerning for malposition.  Not able to tolerate right nephrostomy tube clamping overnight with mild right hydronephrsosis noted/ no left hydronephrosis.  Urine output and renal function has remained stable with minimal drain output which is reassuring.   Plan -case was discussed with the patient and her fianc??, Dr. Cherry, and Dr. Henn (interventional radiology) in detail early this  morning -we discussed with proceeding with removal of malpositioned  ureteral stent  and replacement of correctly positioned antegrade  ureteral stent  into the bladder in interventional radiology -I was called by Dr. Henn during the procedure at which time he was able to place a wire/ open ended ureteral catheter from the kidney down  into the bladder through the UVJ, however, was unable to snare and remove the right malpositioned  double-J ureteral stent.  Her nephrostomy was replaced.  Based on  review of imaging during the procedure, I do not suspect a right  distal ureteral perforation, rather I suspect that the malpositioned right distal coil traverses the UVJ but then likely perforates through the bladder repair itself (not visible cystoscopically) due to heaped up mucosa from the repair. -Following the procedure, I discussed options with the patient's fianc?? and Dr. Henn.  We discussed returning to the OR for a second attempt at cystoscopic/ ureteroscopic removal of the malpositioned stent using the open-ended ureteral catheter which is in good position as a guide.  I feel that this would be best performed in a few days to allow for some of the  bladder mucosa edema to resolve  for better visualization  of the UVJ/ UO.  We will plan to attempt for this on  Monday morning. -Nothing by mouth at midnight on Sunday night, abx on call to OR -recommend leaving all catheters and drains in place and open over the weekend to decompress the system   Electronic Signatures: Brandon, Ashley J (MD)  (Signed 11-Dec-15 13:50)  Authored: Chief Complaint, VITAL SIGNS/ANCILLARY NOTES, Brief Assessment, Lab Results, Assessment/Plan   Last Updated: 11-Dec-15 13:50 by Brandon, Ashley J (MD) 

## 2014-06-05 NOTE — Consult Note (Signed)
Chief Complaint:  Subjective/Chief Complaint RUS negative for left hydronephrosis.  UOP per Foley approximately equivalent from right nephroureteral stent.  JP output minimal.  Cr stable.  Tolerating regular diet.  Hemodynamically stable.   VITAL SIGNS/ANCILLARY NOTES: **Vital Signs.:   09-Dec-15 07:15  Vital Signs Type Routine  Temperature Temperature (F) 99.2  Celsius 37.3  Temperature Source oral  Pulse Pulse 97  Respirations Respirations 13  Systolic BP Systolic BP 824  Diastolic BP (mmHg) Diastolic BP (mmHg) 39  Mean BP 61  Pulse Ox % Pulse Ox % 97  Pulse Ox Activity Level  At rest  Oxygen Delivery Room Air/ 21 %  *Intake and Output.:   Daily 09-Dec-15 07:00  Grand Totals Intake:  3233 Output:  2353    Net:  -827 24 Hr.:  -827  Oral Intake      In:  240  IV (Primary)      In:  2193  IV (Secondary)      In:  800  Urine ml     Out:  2100  JP Drain ml     Out:  70  Other Output ml     Out:  1890  Length of Stay Totals Intake:  6747 Output:  5365    Net:  6144   Brief Assessment:  GEN well developed, well nourished, no acute distress   Cardiac Regular  -- LE edema   Respiratory normal resp effort  clear BS  no use of accessory muscles   Gastrointestinal Normal   Gastrointestinal details normal Soft  Nontender  Nondistended  Incision healing well, scant serous drainage from JP, Foley draining clear yellow urine, right nephrostomy light pink   EXTR negative cyanosis/clubbing   Lab Results: Routine Chem:  09-Dec-15 06:53   Creatinine (comp) 0.94  eGFR (African American) >60  eGFR (Non-African American) >60 (eGFR values <70m/min/1.73 m2 may be an indication of chronic kidney disease (CKD). Calculated eGFR, using the MRDR Study equation, is useful in  patients with stable renal function. The eGFR calculation will not be reliable in acutely ill patients when serum creatinine is changing rapidly. It is not useful in patients on dialysis. The eGFR calculation  may not be applicable to patients at the low and high extremes of body sizes, pregnant women, and vegetarians.)  Routine Hem:  09-Dec-15 06:53   Hemoglobin (CBC)  9.5 (Result(s) reported on 20 Jan 2014 at 07:34AM.)   Assessment/Plan:  Invasive Device Daily Assessment of Necessity:  Does the patient currently have any of the following indwelling devices? foley   Indwelling Urinary Catheter continued, requirement due to   Reason to continue Indwelling Urinary Catheter bladder injury   Assessment/Plan:  Assessment 64year old POD2 s/p abdominal hysterectomy, intraoperative bladder injury near the level of the trigone, unsuccessful retrograde ureteral stent placement and unable to visualize UO's cystoscopically, however efflux of methylene blue was identified.  She was taken by interventional radiology for placement of a right nephroureteral stent, however, unable to place a left-sided tube.  UOP excellent from both Foley/ right nephrostomy with minimal drain output.  Creatinine 0.94 this AM.  No evidence of hydronephrosis on renal ultrasound yesterday afternoon.   Plan -send drain creatinine today, d/c drain if consistent with serum -leave Foley in place x 2 weeks with cystogram prior to removal -no indication for left nephroureteral stent at this time as there does not appear to be obstruction despite nonvisualization of UO -discussed internization of left nephroureteral stent to indwelling JJ for patient comfort  prior to discharge vs. home with right nephroureteral stent vs removal  -plan for OR tomorrow (MAC) for internalization of stent -NPO at MN -prefer abx continue though procedure tomorrow   Electronic Signatures: Sherlynn Stalls (MD)  (Signed 09-Dec-15 11:38)  Authored: Chief Complaint, VITAL SIGNS/ANCILLARY NOTES, Brief Assessment, Lab Results, Assessment/Plan   Last Updated: 09-Dec-15 11:38 by Sherlynn Stalls (MD)

## 2014-06-05 NOTE — Consult Note (Signed)
Chief Complaint:  Subjective/Chief Complaint afebrile for 24 hours.  Cystogram performed yesterday, no evidence of leak with bladder filled to 310 mL's.  Reflux of right ureteral stent seen without any leak from distal ureter at the site of perforation.   VITAL SIGNS/ANCILLARY NOTES: **Vital Signs.:   22-Dec-15 11:59  Vital Signs Type Routine  Celsius 37.1  Temperature Source oral  Pulse Pulse 81  Respirations Respirations 18  Systolic BP Systolic BP 209  Diastolic BP (mmHg) Diastolic BP (mmHg) 72  Mean BP 87  BP Source  if not from Vital Sign Device non-invasive  Pulse Ox % Pulse Ox % 98  *Intake and Output.:   Daily 22-Dec-15 07:00  Grand Totals Intake:  1169 Output:  2625    Net:  -1456 24 Hr.:  -1456  IV (Primary)      In:  1169  Urine ml     Out:  2625  Length of Stay Totals Intake:  18763 Output:  47096    Net:  -[id number]   Brief Assessment:  GEN well developed, well nourished   Cardiac Regular   Respiratory normal resp effort   Gastrointestinal Normal  soft, NT, ND.   Gastrointestinal details normal Soft  Nontender  No masses palpable  No rebound tenderness   EXTR negative cyanosis/clubbing   Lab Results: Routine Chem:  22-Dec-15 05:09   Glucose, Serum 86  BUN 11  Creatinine (comp) 0.89  Sodium, Serum 142  Potassium, Serum 4.3  Chloride, Serum  110  CO2, Serum 26  Calcium (Total), Serum 8.6  Anion Gap  6  Osmolality (calc) 282  eGFR (African American) >60  eGFR (Non-African American) >60 (eGFR values <66m/min/1.73 m2 may be an indication of chronic kidney disease (CKD). Calculated eGFR, using the MRDR Study equation, is useful in  patients with stable renal function. The eGFR calculation will not be reliable in acutely ill patients when serum creatinine is changing rapidly. It is not useful in patients on dialysis. The eGFR calculation may not be applicable to patients at the low and high extremes of body sizes, pregnant women, and vegetarians.)   Routine Hem:  22-Dec-15 05:09   WBC (CBC) 5.5  RBC (CBC)  2.57  Hemoglobin (CBC)  7.7  Hematocrit (CBC)  23.7  Platelet Count (CBC) 405 (Result(s) reported on 02 Feb 2014 at 08:45AM.)  MCV 92  MCH 29.8  MCHC 32.3  RDW 13.8  Bands 4  Segmented Neutrophils 58  Lymphocytes 22  Monocytes 6  Eosinophil 3  Basophil 2  Metamyelocyte 3  Myelocyte 2  Diff Comment 1 RBCs APPEAR NORMAL  Diff Comment 2 NORMAL PLT MORPHOLGY  Result(s) reported on 02 Feb 2014 at 08:45AM.   Assessment/Plan:  Invasive Device Daily Assessment of Necessity:  Does the patient currently have any of the following indwelling devices? foley   Indwelling Urinary Catheter continued, requirement due to   Reason to continue Indwelling Urinary Catheter bladder injury   Assessment/Plan:  Assessment 64year old female two weeks post abdominal hysterectomy with intraoperative bladder injury near the level of the trigone.  Antegrade right double-J ureteral stent placed with distal ureteral perforation, unable to extract in antegrade fashion, eventual right ureteroscopy with extraction of malpositioned double-J, replacement.  She has remained hospitalized due to persistent recurrent fevers  but has been afebrile ??24 hours.  CT imaging did show a small perinephric collection consistent with a small hematoma which does appear to be resolving.  This hematoma does not appear infected and is not large  enough to drain.  Cystogram yesterday negative for leak, catheter removed and voiding well.  Creatinine is stable this morning.   Plan -antibiotics per ID -No indication for drainage of perinephric hematoma -plan for outpatient urology follow-up for cystoscopy, right ureteral stent removal in approximately 3-4 weeks   Electronic Signatures: Sherlynn Stalls (MD)  (Signed 22-Dec-15 13:38)  Authored: Chief Complaint, VITAL SIGNS/ANCILLARY NOTES, Brief Assessment, Lab Results, Assessment/Plan   Last Updated: 22-Dec-15 13:38 by  Sherlynn Stalls (MD)

## 2014-06-07 LAB — SURGICAL PATHOLOGY

## 2014-06-09 NOTE — Op Note (Signed)
PATIENT NAME:  [name], [name] MR#:  [id number] DATE OF BIRTH:  [birth year]  DATE OF PROCEDURE:  01/21/2014  PREOPERATIVE DIAGNOSES: Intraoperative bladder injury, fibroids, concern for possible ureteral obstruction.   POSTOPERATIVE DIAGNOSES: Intraoperative bladder injury, fibroids, concern for possible ureteral obstruction.  PROCEDURES PERFORMED: Cystoscopy, right antegrade nephrostogram.   ATTENDING SURGEON: Sherlynn Stalls, MD   ANESTHESIA: General anesthesia.   ESTIMATED BLOOD LOSS: Minimal.   DRAINS: No new drains.  COMPLICATIONS: Unable to identify and remove right double-J ureteral stent.   INDICATION: This is a patient of Dr. Enzo Bi who underwent open abdominal hysterectomy with an intraoperative bladder injury at the level of the trigone. Cystoscopy after bladder closure showed efflux of methylene blue from the bilateral ureteral orifices; however, the trigone and UOs themselves could not be identified nor could retrograde ureteral stents be placed at the time of the procedure. Given the concern for a possible partial ureteral obstruction and postoperative edema, the decision was made to go ahead and attempt to proceed with bilateral nephroureteral stent placement to achieve a stent across the UVJ bilaterally to keep the distal ureter patent and prevent any ureteral obstruction or renal failure. She was taken to interventional radiology where she underwent placement of a right nephroureteral stent; however, the left was unable to be placed. Postoperatively, she has had excellent output from her right nephroureteral stent and from the Foley with stable renal function and no evidence of hydronephrosis. She returns today to the operating room for internalization of right nephroureteral stent. Risks and benefits of the procedure were explained in detail to the patient who agreed to proceed as planned.   PROCEDURE: The patient was correctly identified in the preoperative holding area and  informed consent was confirmed. She was brought to the operating suite and placed on the table in the supine position. At this time, universal timeout protocol was performed. All team members were identified. Venodyne boots were placed. She head already been on vancomycin and Flagyl since the time of her surgery, therefore no additional antibiotics were given. She was then placed under general anesthesia, repositioned in the dorsal lithotomy position and prepped and draped in standard surgical fashion. Her Foley catheter was then removed and she was reprepped. At this point in time, a 27 French rigid cystoscope was advanced per urethra into the bladder. The mucosal defect from the previous cystotomy repair was identified at the level of the trigone. It appeared relatively unchanged from 2 days ago with, perhaps, slightly less edema. I was still unable to identify either ureteral orifice, nor could the distal coil from the nephroureteral stent be visualized which was unexpected. Fluoroscopy confirmed that the stent appeared to be in good condition with a coil noted at the level of the bladder, but this was certainly not within the lumen of the bladder under direct visualization. At this point in time, it was felt the distal coil either  had possibly perforated the distal ureter and was located within the pelvis or potentially did traverse the UVJ but was located behind the defect with one portion through and through the defect with the remainder of the distal coil in the pelvis. At this point in time, an antegrade nephrostogram was performed and copious amount of contrast was then seen effluxing from the right aspect of the intraluminal repair site. There was minimal to no contrast extravasating outside of the bladder although this was difficult to assess, as I was not able to oblique the patient. At this point in time,  it was deemed not possible to remove this stent in a retrograde fashion. Her Foley catheter was  replaced with a 16 [place] and 10 mL in the balloon. Her nephroureteral stent was left in place but capped to assess whether the right kidney drained effectively around the stent overnight with plans to possibly remove if the kidney drained. The patient was then repositioned in the supine position, reversed from anesthesia, and taken to the PACU in stable condition. Postoperatively, the case was discussed with Dr. Marcelline Mates as well as the patient and her fiance.    ____________________________ Sherlynn Stalls, MD ajb:bm D: 01/22/2014 19:02:01 ET T: 01/23/2014 00:08:22 ET JOB#: [id number]  cc: Sherlynn Stalls, MD, <Dictator> Sherlynn Stalls MD ELECTRONICALLY SIGNED 02/24/2014 15:42

## 2014-06-09 NOTE — Consult Note (Signed)
PATIENT NAME:  [name], [name] MR#:  [id number] DATE OF BIRTH:  1950/11/22  DATE OF CONSULTATION:  01/26/2014  REFERRING PHYSICIAN:  Alanda Slim. DeFrancesco, MD  CONSULTING PHYSICIAN:  Cheral Marker. Ola Spurr, MD  REASON FOR CONSULTATION: Fever to 103 and multiple drug allergies.   HISTORY OF PRESENT ILLNESS: This is a very pleasant 64 year old female who is postop day 7 from a total abdominal hysterectomy and BSO complicated by repair of cystotomy as well as placement of nephrostomy tube and placement and removal of a urethral stent on the right. She was improving clinically and had been on vancomycin for some low-grade temperatures. However, this morning after removal of the JP drain in her left lower quadrant and a nephrostomy tube in her right kidney, she developed high spiking fevers to 103 and some mild chills. She has since defervesced. She has multiple drug allergies. Aztreonam has been added. Blood cultures have been done.   The patient denies any dysuria, although she has a Foley catheter in place and will go home with that. She denies any marked abdominal pain, nausea, vomiting, or diarrhea. She has had a mild cough for the last several days. She has had no calf pain or tenderness or swelling. She has had no sore throat or lesions. She has had an infected tooth pulled several weeks prior to admission.   PAST MEDICAL HISTORY: 1. Lupus.  2. Hypertension.  3. Depression.   PAST SURGICAL HISTORY: As above.   SOCIAL HISTORY: She is engaged. She lives at home. Does not smoke or drink.   FAMILY HISTORY: Noncontributory.   REVIEW OF SYSTEMS:   Eleven systems reviewed and negative except as per HPI.   ALLERGIES:  She is listed as having multiple allergies TO ANTIBIOTICS although she has not very clear on their reactions. She does state that PENICILLIN CAUSES HIVES, CIPROFLOXACIN AND LEVOFLOXACIN CAUSES JITTERINESS, DOXYCYCLINE CAUSED ITCHINESS, CLINDAMYCIN CAUSES HEADACHE. SHE IS ALSO LISTED AS  BEING ALLERGIC TO " "MYCINS", CEFTIN, AMOXICILLIN AND OMNICEF.   PHYSICAL EXAMINATION: VITAL SIGNS: Temperature max today is 103.1, currently 98.3, pulse 86, blood pressure 95/65, respirations 18, saturation 97% on room air. Since admission, she had had some low-grade temperatures of 100.1 on December 12, 100.6 on December 10, but otherwise has been afebrile.  GENERAL: She is pleasant, interactive, in no acute distress.  HEENT: Pupils equal, round, and reactive to light and accommodation. Extraocular movements are intact. Sclerae are anicteric. Oropharynx clear. No thrush. Dentition is normal.  NECK: Supple. No anterior cervical, posterior cervical or supraclavicular lymphadenopathy.  HEART: Regular.  LUNGS: Clear bilaterally.  ABDOMEN: Soft, nontender. She has in her left lower quadrant, a JP drain site that is just a small hole with minimal surrounding erythema. She has a healing hysterectomy incision that has no erythema, tenderness or drainage. On her right flank, she has a site of a prior nephrostomy tube, which does have some soaking urine on the bandage, but has no tenderness.  EXTREMITIES: No clubbing, cyanosis or edema. Negative Homans sign, no calf tenderness.  NEUROLOGIC: She is alert and oriented x3. Grossly nonfocal neuro exam.   DATA: White blood count is 6.4, hemoglobin 9.4, platelets 385,000. Renal function today showed a creatinine of 1.08 up from 0.94. Blood cultures x2 are pending. Urinalysis is pending.   IMAGING: Nephrostomy loopogram done December 11, showed a right nephrostogram with the distal tip of the right urethral stent may be malpositioned. There is a right nephroureteral catheter in place. Ultrasound of the kidneys December 10  showed mild right hydro.   IMPRESSION: A 64 year old female with a history of lupus on Plaquenil as well as multiple drug allergies who has had a complicated postoperative course following an abdominal hysterectomy and bilateral  salpingo-oophorectomy. This involves a cystotomy that was repaired as well as placement of urethral stents and nephrostomy tubes. Clinically, she was doing well but  spiked a high fever to 103 after her nephrostomy tube and JP drain were pulled this morning. White blood count is normal. She has defervesced. Aztreonam was added to the vancomycin. Blood cultures, urinalysis and urine cultures are pending.   RECOMMENDATIONS: 1. Continue vancomycin and aztreonam overnight.  2. Further recommendations based on results of cultures, UA and urine cultures. Her incision sites appear normal with no evidence of infection. Her abdomen is benign. There is no evidence of DVT.   Thank you for the consult. I will be glad to follow with you.    ____________________________ Cheral Marker. Ola Spurr, MD dpf:jp D: 01/26/2014 14:55:00 ET T: 01/26/2014 15:20:27 ET JOB#: [id number]  cc: Cheral Marker. Ola Spurr, MD, <Dictator> Riyad Keena Ola Spurr MD ELECTRONICALLY SIGNED 02/14/2014 21:26

## 2014-06-09 NOTE — Op Note (Signed)
PATIENT NAME:  [name], ARCHULETTA MR#:  [id number] DATE OF BIRTH:  [birth year]  DATE OF PROCEDURE:  01/25/2014  PREOPERATIVE DIAGNOSIS: Malpositioned right ureteral stent.  POSTOPERATIVE DIAGNOSIS: Malpositioned right ureteral stent, right ureteral perforation.   PROCEDURES PERFORMED: Cystoscopy, right ureteroscopy, extraction of malpositioned right ureteral stent, replacement of right double-J ureteral stent, in good position.   ATTENDING SURGEON: Sherlynn Stalls, M.D.   ANESTHESIA: General.  COMPLICATIONS: None.   SPECIMENS: None.   INDICATION: This is a 64 year old female who underwent abdominal hysterectomy and found to have a  bladder injury at the level of the trigone. At the end of the case, cystoscopy revealed that the repair was very close to the trigone. Due to concern for possible partial ureteral obstruction, she underwent right antegrade ureteral stent placement with nephrostomy tube. She returns to the OR at which time it was determined that the right distal coil was not within the bladder, concerning for possible distal ureteral perforation versus the stent behind the repair. She returned to interventional radiology on Friday, 01/22/2014, for attempted antegrade narrowing of the malpositioned right ureteral stent, which was unsuccessful. He was able to place a wire and then a 4-French open-ended ureteral catheter down to the bladder in good position. She returns today to the operating room for attempted ureteroscopy and extraction of the mal-positioned right ureteral double-J stent. Risks and benefits of the procedure were explained in detail. The patient agreed to proceed as planned.   DESCRIPTION OF PROCEDURE: The patient was correctly identified in the preoperative holding area and informed consent was confirmed. She was brought to the operating suite and placed on the table in the supine position. At this time, universal timeout protocol was performed. All team members were  identified. Venodyne boots were already in place. She was then placed under general anesthesia with mask LMA airway and repositioned in the dorsal lithotomy position and prepped and draped in standard surgical fashion. A rigid cystoscope was then introduced per urethra into the bladder and the defect was surveilled. This appeared to be healing nicely with significantly less edema. The 4-French open-ended ureteral catheter could be seen emanating from the UO, which was now some distance away from the repair, which is very reassuring. This was grasped using stent graspers and brought to the level of the urethral meatus. This was then cannulated using a sensor wire up to the level of the kidney. The 4-French open-ended ureteral catheter was then extracted through the flank by an assistant leaving only the wire in place. I then attempted to use a rigid ureteroscope through the UL alongside the wire, but it was unsuccessful. I attempted to place a second wire alongside with some difficulty. I then used a dual-lumen introducer just within the UO, which went without difficulty, and a second wire was able to be advanced up to the level of the kidney. This wire was then back-loaded over the rigid ureteroscope, which was then able to be passed into the distal ureter and advanced up the ureter alongside the safety wire. The stent malposition was then able to be identified and a distal ureteral perforation was noted with the distal coil of this stent not able to be seen. I attempted to use a Segura basket to extract this without success. I then tried an Aredia grasper and luckily was able to get it into one of the holes of the stent for good traction. I then extracted this stent through the UO without difficulty. The safety wire remained in place.  A 6 x 26 French double-J ureteral stent was then advanced over this wire up to the level of the kidney. The wire was partially withdrawn under fluoroscopic guidance and a coil was  noted within the renal pelvis. The wire was then fully withdrawn and coil was noted within the bladder. The string was not left on the stent. I then reintroduced the cystoscope to ensure that an adequate coil was noted within the bladder and it appeared in good position. A 16-French Foley catheter was then replaced using 10 mL of sterile water. The patient was then repositioned in the supine position and taken to the PACU in stable condition. Her nephrostomy tube was clamped prior to being taken to the PACU.   ____________________________ Sherlynn Stalls, MD ajb:sb D: 01/25/2014 08:57:00 ET T: 01/25/2014 09:09:16 ET JOB#: [id number]  cc: Sherlynn Stalls, MD, <Dictator> Sherlynn Stalls MD ELECTRONICALLY SIGNED 02/24/2014 15:43

## 2014-06-09 NOTE — Op Note (Signed)
PATIENT NAME:  [name], [name] MR#:  [id number] DATE OF BIRTH:  Sep 10, 1950  DATE OF PROCEDURE:  01/18/2014  PREOPERATIVE DIAGNOSIS:  Intraoperative bladder injury.   POSTOPERATIVE DIAGNOSIS:  Intraoperative bladder injury.   PROCEDURE PERFORMED:  Complex repair of intraoperative cystotomy, cystoscopy, attempted bilateral retrograde placement of ureteral stents (not successful).   ANESTHESIA: General anesthesia.   CONSULTING SURGEON:  Dr. Erlene Quan.   PRIMARY SURGEON:   Dr. Enzo Bi.    ASSISTANT SURGEON:  Dr. Marcelline Mates.    INDICATION: This is a 64 year old female who is undergoing open total abdominal hysterectomy for postmenopausal bleeding and uterine fibroids. Intraoperatively she was found to have significant adhesions of her bladder to her anterior vaginal wall.  During this dissection a transverse cystotomy was made on the posterior bladder wall somewhat near the level of the trigone extending across a large portion of the bladder. I was called in to assist with repair of this cystotomy.  Once scrubbed the cystotomy was identified. At this point in the procedure the uterus was out and the vaginal cuff was remaining which had not yet been closed. There was a very ill-defined adherent delicate plane between the anterior vaginal wall and the posterior wall of the bladder. This was dissected off a little more to further define these two structures. At this point in time the cystotomy was carefully repaired in 2 layers first using a running 3-0 Vicryl suture and a second layer using a 2-0 Vicryl suture. The first layer was mucosal and the second layer was seromuscular. At this time the bladder was tested for leaks using sterile milk and there was still a small cystotomy noted at the right margin of the repair. Additional layers with figure-of-eight sutures were performed until there was no further leak identified. The vaginal cuff was then closed by Dr. Enzo Bi. A JP drain was placed overlying this  repair to assess for any evidence of ongoing leak. The remainder of the procedure was then completed.  I asked Dr. Enzo Bi and Dr. Marcelline Mates to scope the patient at the end of the procedure to identify the UOs given the proximity of this repair to the trigone. I was called back into the room at which time the repair could be seen at the level of the trigone and appeared that the UOs were effluxing methylene blue, however could not be easily seen as they were drawn up into the repair itself. I attempted for approximately 45 minutes using various techniques including fluoroscopy to cannulate these UOs, but was ultimately unsuccessful. Given the concern for at least partial bilateral ureteral obstruction the decision was made to bring the patient down to interventional radiology for antegrade nephroureteral stent placement in the event that there was some degree of partial obstruction either from the repair itself or edema at the level of the repair in the bladder. I will follow this patient closely and monitor her urine output.    ____________________________ Sherlynn Stalls, MD ajb:bu D: 01/20/2014 11:51:09 ET T: 01/20/2014 15:50:05 ET JOB#: [id number]  cc: Sherlynn Stalls, MD, <Dictator> Sherlynn Stalls MD ELECTRONICALLY SIGNED 02/24/2014 15:42

## 2014-07-07 DIAGNOSIS — N390 Urinary tract infection, site not specified: Secondary | ICD-10-CM | POA: Insufficient documentation

## 2014-07-07 DIAGNOSIS — I7300 Raynaud's syndrome without gangrene: Secondary | ICD-10-CM | POA: Insufficient documentation

## 2014-08-02 ENCOUNTER — Other Ambulatory Visit: Payer: Self-pay | Admitting: Family Medicine

## 2014-08-02 DIAGNOSIS — N1339 Other hydronephrosis: Secondary | ICD-10-CM

## 2014-09-08 ENCOUNTER — Ambulatory Visit: Payer: BC Managed Care – PPO | Admitting: Obstetrics and Gynecology

## 2014-11-15 ENCOUNTER — Encounter: Payer: Self-pay | Admitting: Emergency Medicine

## 2014-11-15 ENCOUNTER — Emergency Department
Admission: EM | Admit: 2014-11-15 | Discharge: 2014-11-15 | Disposition: A | Discharge status: Home / Self Care | Payer: BC Managed Care – PPO | Attending: Emergency Medicine | Admitting: Emergency Medicine

## 2014-11-15 DIAGNOSIS — Y92009 Unspecified place in unspecified non-institutional (private) residence as the place of occurrence of the external cause: Secondary | ICD-10-CM | POA: Diagnosis not present

## 2014-11-15 DIAGNOSIS — Z880 Allergy status to penicillin: Secondary | ICD-10-CM | POA: Diagnosis not present

## 2014-11-15 DIAGNOSIS — Y288XXA Contact with other sharp object, undetermined intent, initial encounter: Secondary | ICD-10-CM | POA: Insufficient documentation

## 2014-11-15 DIAGNOSIS — Z23 Encounter for immunization: Secondary | ICD-10-CM | POA: Insufficient documentation

## 2014-11-15 DIAGNOSIS — I10 Essential (primary) hypertension: Secondary | ICD-10-CM | POA: Diagnosis not present

## 2014-11-15 DIAGNOSIS — S81811A Laceration without foreign body, right lower leg, initial encounter: Secondary | ICD-10-CM | POA: Insufficient documentation

## 2014-11-15 DIAGNOSIS — Z79899 Other long term (current) drug therapy: Secondary | ICD-10-CM | POA: Insufficient documentation

## 2014-11-15 DIAGNOSIS — T148XXA Other injury of unspecified body region, initial encounter: Secondary | ICD-10-CM

## 2014-11-15 DIAGNOSIS — Y9389 Activity, other specified: Secondary | ICD-10-CM | POA: Insufficient documentation

## 2014-11-15 DIAGNOSIS — Y998 Other external cause status: Secondary | ICD-10-CM | POA: Diagnosis not present

## 2014-11-15 DIAGNOSIS — Z87891 Personal history of nicotine dependence: Secondary | ICD-10-CM | POA: Insufficient documentation

## 2014-11-15 MED ORDER — LIDOCAINE-EPINEPHRINE (PF) 1 %-1:200000 IJ SOLN
30.0000 mL | Freq: Once | INTRAMUSCULAR | Status: AC
  Administered 2014-11-15: 30 mL

## 2014-11-15 MED ORDER — CYCLOBENZAPRINE HCL 10 MG PO TABS: 10.0000 mg | ORAL_TABLET | Freq: Three times a day (TID) | ORAL | Status: DC | PRN

## 2014-11-15 MED ORDER — TRAMADOL HCL 50 MG PO TABS
50.0000 mg | ORAL_TABLET | Freq: Four times a day (QID) | ORAL | Status: DC | PRN
Start: 2014-11-15 — End: 2016-06-29

## 2014-11-15 MED ORDER — TETANUS-DIPHTHERIA TOXOIDS TD 5-2 LFU IM INJ: 0.5000 mL | INJECTION | Freq: Once | INTRAMUSCULAR | Status: DC

## 2014-11-15 MED ORDER — TRAMADOL HCL 50 MG PO TABS
50.0000 mg | ORAL_TABLET | Freq: Once | ORAL | Status: AC
  Administered 2014-11-15: 50 mg via ORAL
  Filled 2014-11-15: qty 1

## 2014-11-15 MED ORDER — LIDOCAINE-EPINEPHRINE (PF) 1 %-1:200000 IJ SOLN
INTRAMUSCULAR | Status: AC
  Filled 2014-11-15: qty 30

## 2014-11-15 MED ORDER — TETANUS-DIPHTH-ACELL PERTUSSIS 5-2.5-18.5 LF-MCG/0.5 IM SUSP
INTRAMUSCULAR | Status: AC
  Administered 2014-11-15: 0.5 mL via INTRAMUSCULAR
  Filled 2014-11-15: qty 0.5

## 2014-11-15 MED ORDER — BACITRACIN ZINC 500 UNIT/GM EX OINT
1.0000 "application " | TOPICAL_OINTMENT | Freq: Two times a day (BID) | CUTANEOUS | Status: DC
  Administered 2014-11-15: 1 via TOPICAL
  Filled 2014-11-15: qty 0.9

## 2014-11-15 NOTE — ED Notes (Signed)
Pt to ed with c/o laceration to right lower leg.   Pt states she was cleaning an old house and cut her leg on a piece of plastic.

## 2014-11-15 NOTE — ED Provider Notes (Signed)
[facility] Emergency Department Provider Note ____________________________________________  Time seen: Approximately 10:22 PM  I have reviewed the triage vital signs and the nursing notes.   HISTORY  Chief Complaint Laceration   HPI [name] is a 64 y.o. female who presents to the emergency department for evaluation of laceration to her right lateral lower leg. She was cleaning out her stepmother's house, lifted a trash bag that had a sharp object sticking out of the side and sliced her leg. Bleeding is well controlled.   Past Medical History  Diagnosis Date  . Hypertension   . Anxiety   . Unstable bladder   . Vaginal atrophy   . Rectocele   . Urination frequency   . SUI (stress urinary incontinence, female)   . 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 Vaginal itching   . PMB (postmenopausal bleeding)   . Cystocele     There are no active problems to display for this patient.   Past Surgical History  Procedure Laterality Date  . Abdominal hysterectomy  01/19/2014    [place]  . Cystotomy repair  01/18/2014  . Cesarean section  1986  . Tubal ligation  1986  . Tonsillectomy  1969    Current Outpatient Rx  Name  Route  Sig  Dispense  Refill  . amlodipine-benazepril (LOTREL) 2.5-10 MG per capsule   Oral   Take 1 capsule by mouth daily.         . clopidogrel (PLAVIX) 75 MG tablet   Oral   Take 75 mg by mouth daily.         . cyclobenzaprine (FLEXERIL) 10 MG tablet   Oral   Take 1 tablet (10 mg total) by mouth 3 (three) times daily as needed for muscle spasms.   30 tablet   0   . EPINEPHrine 1 MG/ML SOLN   Injection   Inject as directed.         Marland Kitchen estradiol (ESTRACE) 0.1 MG/GM vaginal cream   Vaginal   Place 1 Applicatorful vaginally 3 (three) times a week.         . hydroxychloroquine (PLAQUENIL) 200 MG tablet   Oral   Take by mouth daily.         Marland Kitchen METOPROLOL TARTRATE PO   Oral   Take 25 mg by  mouth.         . oxybutynin (DITROPAN) 5 MG tablet   Oral   Take 5 mg by mouth 3 (three) times daily.         . traMADol (ULTRAM) 50 MG tablet   Oral   Take 1 tablet (50 mg total) by mouth every 6 (six) hours as needed.   9 tablet   0   . venlafaxine (EFFEXOR) 75 MG tablet   Oral   Take 75 mg by mouth 2 (two) times daily.           Allergies Amoxicillin; Biaxin; Cefuroxime; Ciprofloxacin; Clindamycin/lincomycin; Doxycycline; Levaquin; Motrin; and Sulfa antibiotics  History reviewed. No pertinent family history.  Social History Social History  Substance Use Topics  . Smoking status: Former Smoker    Quit date: 02/13/1983  . Smokeless tobacco: None  . Alcohol Use: Yes    Review of Systems   Constitutional: No fever/chills Eyes: No visual changes. ENT: No congestion or rhinorrhea Cardiovascular: Denies chest pain. Respiratory: Denies shortness of breath. Gastrointestinal: No abdominal pain.  No nausea, no vomiting.  No diarrhea.  No constipation. Genitourinary: Negative for dysuria. Musculoskeletal: Negative for back pain. Skin: Laceration to the right lower extremity Neurological: Negative for headaches, focal weakness or numbness.  10-point ROS otherwise negative.  ____________________________________________   PHYSICAL EXAM:  VITAL SIGNS: ED Triage Vitals  Enc Vitals Group     BP 11/15/14 0914 131/83 mmHg     Pulse Rate 11/15/14 0914 102     Resp 11/15/14 0914 20     Temp 11/15/14 0914 97.7 F (36.5 C)     Temp Source 11/15/14 0914 Oral     SpO2 11/15/14 0914 97 %     Weight 11/15/14 0914 148 lb (67.132 kg)     Height 11/15/14 0914 5\' 2"  (1.575 m)     Head Cir --      Peak Flow --      Pain Score 11/15/14 0914 4     Pain Loc --      Pain Edu? --      Excl. in [place]? --     Constitutional: Alert and oriented. Well appearing and in no acute distress. Eyes: Conjunctivae are normal. PERRL. EOMI. Head: Atraumatic. Nose: No  congestion/rhinnorhea. Mouth/Throat: Mucous membranes are moist.  Oropharynx non-erythematous. No oral lesions. Neck: No stridor. Cardiovascular: Normal rate, regular rhythm.  Good peripheral circulation. Respiratory: Normal respiratory effort.  No retractions. Lungs CTAB. Gastrointestinal: Soft and nontender. No distention. No abdominal bruits.  Musculoskeletal: No lower extremity tenderness nor edema.  No joint effusions. Neurologic:  Normal speech and language. No gross focal neurologic deficits are appreciated. Speech is normal. No gait instability. Skin:  Multilayer laceration to the right lower extremity; Negative for petechiae.  Psychiatric: Mood and affect are normal. Speech and behavior are normal.  ____________________________________________   LABS (all labs ordered are listed, but only abnormal results are displayed)  Labs Reviewed - No data to display ____________________________________________  EKG   ____________________________________________  RADIOLOGY  Not indicated ____________________________________________   PROCEDURES  Procedure(s) performed:   LACERATION REPAIR Performed by: Sherrie George Authorized by: Sherrie George Consent: Verbal consent obtained. Risks and benefits: risks, benefits and alternatives were discussed Consent given by: patient Patient identity confirmed: provided demographic data Prepped and Draped in normal sterile fashion Wound explored  Laceration Location: Right lateral lower extremity  Laceration Length: 6cm  No Foreign Bodies seen or palpated  Anesthesia: local infiltration  Local anesthetic: lidocaine 1% with epinephrine  Anesthetic total: 6 ml  Irrigation method: syringe  Amount of cleaning: Copious  Skin closure: 4-0 Vicryl, 4-0 Nylon  Number of sutures: 12  Technique:  Multi layer closure: Muscle laceration fairly superficial, however reapproximated with 4-0 vicryl--3 sutures Fascia layer closed with  4-0 vicryl-3 sutures Subcutaneous layer closed with a running 4-0 vicryl Dermal layer closed with 4-0 nylon in horizontal mattress sutures-- 5 sutures  Patient tolerance: Patient tolerated the procedure well with no immediate complications.  ____________________________________________   INITIAL IMPRESSION / ASSESSMENT AND PLAN / ED COURSE  Pertinent labs & imaging results that were available during my care of the patient were reviewed by me and considered in my medical decision making (see chart for details).  Patient was advised to elevate the leg for the next 24 hours. She was advised to follow up with orthopedics. Wound care was discussed. She was advised to have the sutures removed in 10 days. She was advised to return to the emergency room for symptoms that change or worsen if she is unable to see her primary care provider or the orthopedic  specialist. ____________________________________________   FINAL CLINICAL IMPRESSION(S) / ED DIAGNOSES  Final diagnoses:  Laceration of lower extremity, right, initial encounter  Laceration of muscle       Victorino Dike, FNP 11/15/14 1558  Daymon Larsen, MD 11/15/14 [phone]

## 2015-04-27 ENCOUNTER — Other Ambulatory Visit: Payer: Self-pay | Admitting: Family Medicine

## 2015-04-27 DIAGNOSIS — Z1231 Encounter for screening mammogram for malignant neoplasm of breast: Secondary | ICD-10-CM

## 2015-06-02 ENCOUNTER — Ambulatory Visit: Payer: BC Managed Care – PPO

## 2015-06-08 ENCOUNTER — Ambulatory Visit
Admission: RE | Admit: 2015-06-08 | Discharge: 2015-06-08 | Disposition: A | Discharge status: Home / Self Care | Payer: BC Managed Care – PPO | Source: Ambulatory Visit | Attending: Family Medicine | Admitting: Family Medicine

## 2015-06-08 DIAGNOSIS — Z1231 Encounter for screening mammogram for malignant neoplasm of breast: Secondary | ICD-10-CM | POA: Diagnosis present

## 2015-12-14 DIAGNOSIS — I2119 ST elevation (STEMI) myocardial infarction involving other coronary artery of inferior wall: Secondary | ICD-10-CM

## 2015-12-14 HISTORY — DX: ST elevation (STEMI) myocardial infarction involving other coronary artery of inferior wall: I21.19

## 2015-12-14 HISTORY — PX: CORONARY ANGIOPLASTY WITH STENT PLACEMENT: SHX49

## 2016-01-03 DIAGNOSIS — I213 ST elevation (STEMI) myocardial infarction of unspecified site: Secondary | ICD-10-CM | POA: Insufficient documentation

## 2016-01-09 ENCOUNTER — Telehealth: Payer: Self-pay | Admitting: *Deleted

## 2016-01-23 DIAGNOSIS — I252 Old myocardial infarction: Secondary | ICD-10-CM | POA: Insufficient documentation

## 2016-02-13 HISTORY — PX: JOINT REPLACEMENT: SHX530

## 2016-02-27 ENCOUNTER — Encounter: Payer: Medicare Other | Attending: Cardiology | Admitting: *Deleted

## 2016-02-27 VITALS — Ht 63.2 in | Wt 154.5 lb

## 2016-02-27 DIAGNOSIS — I213 ST elevation (STEMI) myocardial infarction of unspecified site: Secondary | ICD-10-CM | POA: Diagnosis not present

## 2016-02-27 DIAGNOSIS — Z9861 Coronary angioplasty status: Secondary | ICD-10-CM | POA: Insufficient documentation

## 2016-02-27 DIAGNOSIS — Z48812 Encounter for surgical aftercare following surgery on the circulatory system: Secondary | ICD-10-CM | POA: Insufficient documentation

## 2016-02-27 NOTE — Patient Instructions (Signed)
Patient Instructions  Patient Details  Name: [name] MRN: [id number] Date of Birth: Apr 14, 1950 Referring Provider:  Teodoro Spray, MD  Below are the personal goals you chose as well as exercise and nutrition goals. Our goal is to help you keep on track towards obtaining and maintaining your goals. We will be discussing your progress on these goals with you throughout the program.  Initial Exercise Prescription:     Initial Exercise Prescription - 02/27/16 1600      Date of Initial Exercise RX and Referring Provider   Date 02/27/16   Referring Provider Bartholome Bill MD     Treadmill   MPH 2.6   Grade 0.5   Minutes 15   METs 3.17     NuStep   Level 3   Minutes 15   METs 2     Recumbant Elliptical   Level 2   RPM 50   Minutes 15   METs 2     Prescription Details   Frequency (times per week) 3   Duration Progress to 45 minutes of aerobic exercise without signs/symptoms of physical distress     Intensity   THRR 40-80% of Max Heartrate 100-137   Ratings of Perceived Exertion 11-15   Perceived Dyspnea 0-4     Progression   Progression Continue to progress workloads to maintain intensity without signs/symptoms of physical distress.     Resistance Training   Training Prescription Yes   Weight 3 lbs   Reps 10-15      Exercise Goals: Frequency: Be able to perform aerobic exercise three times per week working toward 3-5 days per week.  Intensity: Work with a perceived exertion of 11 (fairly light) - 15 (hard) as tolerated. Follow your new exercise prescription and watch for changes in prescription as you progress with the program. Changes will be reviewed with you when they are made.  Duration: You should be able to do 30 minutes of continuous aerobic exercise in addition to a 5 minute warm-up and a 5 minute cool-down routine.  Nutrition Goals: Your personal nutrition goals will be established when you do your nutrition analysis with the dietician.  The  following are nutrition guidelines to follow: Cholesterol < 200mg /day Sodium < 1500mg /day Fiber: Women over 50 yrs - 21 grams per day  Personal Goals:     Personal Goals and Risk Factors at Admission - 02/27/16 1332      Core Components/Risk Factors/Patient Goals on Admission    Weight Management Obesity;Weight Loss;Yes   Intervention Weight Management: Develop a combined nutrition and exercise program designed to reach desired caloric intake, while maintaining appropriate intake of nutrient and fiber, sodium and fats, and appropriate energy expenditure required for the weight goal.;Weight Management: Provide education and appropriate resources to help participant work on and attain dietary goals.;Weight Management/Obesity: Establish reasonable short term and long term weight goals.;Obesity: Provide education and appropriate resources to help participant work on and attain dietary goals.   Admit Weight 154 lb 1.6 oz (69.9 kg)   Goal Weight: Short Term 150 lb (68 kg)   Goal Weight: Long Term 130 lb (59 kg)   Expected Outcomes Short Term: Continue to assess and modify interventions until short term weight is achieved;Long Term: Adherence to nutrition and physical activity/exercise program aimed toward attainment of established weight goal;Weight Loss: Understanding of general recommendations for a balanced deficit meal plan, which promotes 1-2 lb weight loss per week and includes a negative energy balance of [id number] kcal/d  Sedentary Yes   Intervention Provide advice, education, support and counseling about physical activity/exercise needs.;Develop an individualized exercise prescription for aerobic and resistive training based on initial evaluation findings, risk stratification, comorbidities and participant's personal goals.   Expected Outcomes Achievement of increased cardiorespiratory fitness and enhanced flexibility, muscular endurance and strength shown through measurements of functional  capacity and personal statement of participant.   Increase Strength and Stamina Yes   Intervention Provide advice, education, support and counseling about physical activity/exercise needs.;Develop an individualized exercise prescription for aerobic and resistive training based on initial evaluation findings, risk stratification, comorbidities and participant's personal goals.   Expected Outcomes Achievement of increased cardiorespiratory fitness and enhanced flexibility, muscular endurance and strength shown through measurements of functional capacity and personal statement of participant.   Hypertension Yes   Intervention Provide education on lifestyle modifcations including regular physical activity/exercise, weight management, moderate sodium restriction and increased consumption of fresh fruit, vegetables, and low fat dairy, alcohol moderation, and smoking cessation.;Monitor prescription use compliance.   Expected Outcomes Short Term: Continued assessment and intervention until BP is < 140/40mm HG in hypertensive participants. < 130/50mm HG in hypertensive participants with diabetes, heart failure or chronic kidney disease.;Long Term: Maintenance of blood pressure at goal levels.   Lipids Yes   Intervention Provide education and support for participant on nutrition & aerobic/resistive exercise along with prescribed medications to achieve LDL 70mg , HDL >40mg .   Expected Outcomes Short Term: Participant states understanding of desired cholesterol values and is compliant with medications prescribed. Participant is following exercise prescription and nutrition guidelines.;Long Term: Cholesterol controlled with medications as prescribed, with individualized exercise RX and with personalized nutrition plan. Value goals: LDL < 70mg , HDL > 40 mg.   Stress Yes  Current stress is being resolved soon.  Has non-family person living with her and it has become stressful   Intervention Offer individual and/or  small group education and counseling on adjustment to heart disease, stress management and health-related lifestyle change. Teach and support self-help strategies.;Refer participants experiencing significant psychosocial distress to appropriate mental health specialists for further evaluation and treatment. When possible, include family members and significant others in education/counseling sessions.   Expected Outcomes Short Term: Participant demonstrates changes in health-related behavior, relaxation and other stress management skills, ability to obtain effective social support, and compliance with psychotropic medications if prescribed.;Long Term: Emotional wellbeing is indicated by absence of clinically significant psychosocial distress or social isolation.      Tobacco Use Initial Evaluation: History  Smoking Status  . Former Smoker  . Quit date: 02/13/1983  Smokeless Tobacco  . Not on file    Copy of goals given to participant.

## 2016-02-27 NOTE — Progress Notes (Signed)
Cardiac Individual Treatment Plan  Patient Details  Name: [name] MRN: [id number] Date of Birth: Dec 05, 1950 Referring Provider:   Flowsheet Row Cardiac Rehab from 02/27/2016 in [facility] Cardiac and Pulmonary Rehab  Referring Provider  Bartholome Bill MD      Initial Encounter Date:  Flowsheet Row Cardiac Rehab from 02/27/2016 in [facility] Cardiac and Pulmonary Rehab  Date  02/27/16  Referring Provider  Bartholome Bill MD      Visit Diagnosis: ST elevation myocardial infarction (STEMI), unspecified artery ([place])  S/P PTCA (percutaneous transluminal coronary angioplasty)  Patient's Home Medications on Admission:  Current Outpatient Prescriptions:  .  amlodipine-benazepril (LOTREL) 2.5-10 MG per capsule, Take 1 capsule by mouth daily., Disp: , Rfl:  .  clopidogrel (PLAVIX) 75 MG tablet, Take 75 mg by mouth daily., Disp: , Rfl:  .  cyclobenzaprine (FLEXERIL) 10 MG tablet, Take 1 tablet (10 mg total) by mouth 3 (three) times daily as needed for muscle spasms., Disp: 30 tablet, Rfl: 0 .  EPINEPHrine 1 MG/ML SOLN, Inject as directed., Disp: , Rfl:  .  estradiol (ESTRACE) 0.1 MG/GM vaginal cream, Place 1 Applicatorful vaginally 3 (three) times a week., Disp: , Rfl:  .  hydroxychloroquine (PLAQUENIL) 200 MG tablet, Take by mouth daily., Disp: , Rfl:  .  METOPROLOL TARTRATE PO, Take 25 mg by mouth., Disp: , Rfl:  .  oxybutynin (DITROPAN) 5 MG tablet, Take 5 mg by mouth 3 (three) times daily., Disp: , Rfl:  .  traMADol (ULTRAM) 50 MG tablet, Take 1 tablet (50 mg total) by mouth every 6 (six) hours as needed., Disp: 9 tablet, Rfl: 0 .  venlafaxine (EFFEXOR) 75 MG tablet, Take 75 mg by mouth 2 (two) times daily., Disp: , Rfl:   Past Medical History: Past Medical History:  Diagnosis Date  . Anxiety   . Cystocele   . Hypertension   . PMB (postmenopausal bleeding)   . Rectocele   . SUI (stress urinary incontinence, female)   . Unstable bladder   . Urination frequency   . 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 Vaginal atrophy   . Vaginal itching     Tobacco Use: History  Smoking Status  . Former Smoker  . Quit date: 02/13/1983  Smokeless Tobacco  . Not on file    Labs: Recent Review Flowsheet Data    There is no flowsheet data to display.       Exercise Target Goals: Date: 02/27/16  Exercise Program Goal: Individual exercise prescription set with THRR, safety & activity barriers. Participant demonstrates ability to understand and report RPE using BORG scale, to self-measure pulse accurately, and to acknowledge the importance of the exercise prescription.  Exercise Prescription Goal: Starting with aerobic activity 30 plus minutes a day, 3 days per week for initial exercise prescription. Provide home exercise prescription and guidelines that participant acknowledges understanding prior to discharge.  Activity Barriers & Risk Stratification:     Activity Barriers & Cardiac Risk Stratification - 02/27/16 1342      Activity Barriers & Cardiac Risk Stratification   Activity Barriers None      6 Minute Walk:     6 Minute Walk    Row Name 02/27/16 1623         6 Minute Walk   Phase Initial     Distance 1530 feet     Walk Time 6 minutes     # of Rest Breaks 0     MPH 2.9  METS 3.67     RPE 10     VO2 Peak 12.8     Symptoms No     Resting HR 7 bpm     Resting BP 126/54     Max Ex. HR 113 bpm     Max Ex. BP 134/70     2 Minute Post BP 126/64        Initial Exercise Prescription:     Initial Exercise Prescription - 02/27/16 1600      Date of Initial Exercise RX and Referring Provider   Date 02/27/16   Referring Provider Bartholome Bill MD     Treadmill   MPH 2.6   Grade 0.5   Minutes 15   METs 3.17     NuStep   Level 3   Minutes 15   METs 2     Recumbant Elliptical   Level 2   RPM 50   Minutes 15   METs 2     Prescription Details   Frequency (times per week) 3   Duration Progress to 45 minutes of aerobic exercise  without signs/symptoms of physical distress     Intensity   THRR 40-80% of Max Heartrate 100-137   Ratings of Perceived Exertion 11-15   Perceived Dyspnea 0-4     Progression   Progression Continue to progress workloads to maintain intensity without signs/symptoms of physical distress.     Resistance Training   Training Prescription Yes   Weight 3 lbs   Reps 10-15      Perform Capillary Blood Glucose checks as needed.  Exercise Prescription Changes:     Exercise Prescription Changes    Row Name 02/27/16 1500             Exercise Review   Progression -  walk test results         Response to Exercise   Blood Pressure (Admit) 126/54       Blood Pressure (Exercise) 134/70       Blood Pressure (Exit) 126/64       Heart Rate (Admit) 64 bpm       Heart Rate (Exercise) 113 bpm       Heart Rate (Exit) 69 bpm       Oxygen Saturation (Admit) 98 %       Oxygen Saturation (Exercise) 100 %       Rating of Perceived Exertion (Exercise) 10          Exercise Comments:   Discharge Exercise Prescription (Final Exercise Prescription Changes):     Exercise Prescription Changes - 02/27/16 1500      Exercise Review   Progression --  walk test results     Response to Exercise   Blood Pressure (Admit) 126/54   Blood Pressure (Exercise) 134/70   Blood Pressure (Exit) 126/64   Heart Rate (Admit) 64 bpm   Heart Rate (Exercise) 113 bpm   Heart Rate (Exit) 69 bpm   Oxygen Saturation (Admit) 98 %   Oxygen Saturation (Exercise) 100 %   Rating of Perceived Exertion (Exercise) 10      Nutrition:  Target Goals: Understanding of nutrition guidelines, daily intake of sodium 1500mg , cholesterol 200mg , calories 30% from fat and 7% or less from saturated fats, daily to have 5 or more servings of fruits and vegetables.  Biometrics:     Pre Biometrics - 02/27/16 1624      Pre Biometrics   Height 5' 3.2" (1.605 m)   Weight 154  lb 8 oz (70.1 kg)   Waist Circumference 33 inches    Hip Circumference 40 inches   Waist to Hip Ratio 0.82 %   BMI (Calculated) 27.3   Single Leg Stand 28.51 seconds       Nutrition Therapy Plan and Nutrition Goals:     Nutrition Therapy & Goals - 02/27/16 1332      Intervention Plan   Intervention Prescribe, educate and counsel regarding individualized specific dietary modifications aiming towards targeted core components such as weight, hypertension, lipid management, diabetes, heart failure and other comorbidities.   Expected Outcomes Short Term Goal: Understand basic principles of dietary content, such as calories, fat, sodium, cholesterol and nutrients.;Short Term Goal: A plan has been developed with personal nutrition goals set during dietitian appointment.;Long Term Goal: Adherence to prescribed nutrition plan.      Nutrition Discharge: Rate Your Plate Scores:     Nutrition Assessments - 02/27/16 1332      MEDFICTS Scores   Pre Score 28      Nutrition Goals Re-Evaluation:   Psychosocial: Target Goals: Acknowledge presence or absence of depression, maximize coping skills, provide positive support system. Participant is able to verbalize types and ability to use techniques and skills needed for reducing stress and depression.  Initial Review & Psychosocial Screening:     Initial Psych Review & Screening - 02/27/16 1344      Initial Review   Current issues with Current Sleep Concerns  unable to get to sleep.   Sleep pattern to bed at 2 am sleeps until 10 am      Quality of Life Scores:     Quality of Life - 02/27/16 1340      Quality of Life Scores   Health/Function Pre 19.33 %   Socioeconomic Pre 24 %   Psych/Spiritual Pre 30 %   Family Pre 28.8 %   GLOBAL Pre 24 %      PHQ-9: Recent Review Flowsheet Data    Depression screen [facility] 2/9 02/27/2016   Decreased Interest 2   Down, Depressed, Hopeless 0   PHQ - 2 Score 2   Altered sleeping 3   Tired, decreased energy 1   Change in appetite 2    Feeling bad or failure about yourself  1   Trouble concentrating 2   Moving slowly or fidgety/restless 0   Suicidal thoughts 0   PHQ-9 Score 11   Difficult doing work/chores Somewhat difficult      Psychosocial Evaluation and Intervention:   Psychosocial Re-Evaluation:   Vocational Rehabilitation: Provide vocational rehab assistance to qualifying candidates.   Vocational Rehab Evaluation & Intervention:     Vocational Rehab - 02/27/16 1343      Initial Vocational Rehab Evaluation & Intervention   Assessment shows need for Vocational Rehabilitation No      Education: Education Goals: Education classes will be provided on a weekly basis, covering required topics. Participant will state understanding/return demonstration of topics presented.  Learning Barriers/Preferences:     Learning Barriers/Preferences - 02/27/16 1342      Learning Barriers/Preferences   Learning Barriers --  Attention deficit   Learning Preferences Pictoral;Skilled Demonstration;Written Material;None      Education Topics: General Nutrition Guidelines/Fats and Fiber: -Group instruction provided by verbal, written material, models and posters to present the general guidelines for heart healthy nutrition. Gives an explanation and review of dietary fats and fiber.   Controlling Sodium/Reading Food Labels: -Group verbal and written material supporting the discussion of sodium use  in heart healthy nutrition. Review and explanation with models, verbal and written materials for utilization of the food label.   Exercise Physiology & Risk Factors: - Group verbal and written instruction with models to review the exercise physiology of the cardiovascular system and associated critical values. Details cardiovascular disease risk factors and the goals associated with each risk factor.   Aerobic Exercise & Resistance Training: - Gives group verbal and written discussion on the health impact of inactivity.  On the components of aerobic and resistive training programs and the benefits of this training and how to safely progress through these programs.   Flexibility, Balance, General Exercise Guidelines: - Provides group verbal and written instruction on the benefits of flexibility and balance training programs. Provides general exercise guidelines with specific guidelines to those with heart or lung disease. Demonstration and skill practice provided.   Stress Management: - Provides group verbal and written instruction about the health risks of elevated stress, cause of high stress, and healthy ways to reduce stress.   Depression: - Provides group verbal and written instruction on the correlation between heart/lung disease and depressed mood, treatment options, and the stigmas associated with seeking treatment.   Anatomy & Physiology of the Heart: - Group verbal and written instruction and models provide basic cardiac anatomy and physiology, with the coronary electrical and arterial systems. Review of: AMI, Angina, Valve disease, Heart Failure, Cardiac Arrhythmia, Pacemakers, and the ICD.   Cardiac Procedures: - Group verbal and written instruction and models to describe the testing methods done to diagnose heart disease. Reviews the outcomes of the test results. Describes the treatment choices: Medical Management, Angioplasty, or Coronary Bypass Surgery.   Cardiac Medications: - Group verbal and written instruction to review commonly prescribed medications for heart disease. Reviews the medication, class of the drug, and side effects. Includes the steps to properly store meds and maintain the prescription regimen.   Go Sex-Intimacy & Heart Disease, Get SMART - Goal Setting: - Group verbal and written instruction through game format to discuss heart disease and the return to sexual intimacy. Provides group verbal and written material to discuss and apply goal setting through the application of  the S.M.A.R.T. Method.   Other Matters of the Heart: - Provides group verbal, written materials and models to describe Heart Failure, Angina, Valve Disease, and Diabetes in the realm of heart disease. Includes description of the disease process and treatment options available to the cardiac patient.   Exercise & Equipment Safety: - Individual verbal instruction and demonstration of equipment use and safety with use of the equipment. Flowsheet Row Cardiac Rehab from 02/27/2016 in [facility] Cardiac and Pulmonary Rehab  Date  02/27/16  Educator  SB  Instruction Review Code  2- meets goals/outcomes      Infection Prevention: - Provides verbal and written material to individual with discussion of infection control including proper hand washing and proper equipment cleaning during exercise session. Flowsheet Row Cardiac Rehab from 02/27/2016 in [facility] Cardiac and Pulmonary Rehab  Date  02/27/16  Educator  SB  Instruction Review Code  2- meets goals/outcomes      Falls Prevention: - Provides verbal and written material to individual with discussion of falls prevention and safety. Flowsheet Row Cardiac Rehab from 02/27/2016 in [facility] Cardiac and Pulmonary Rehab  Date  02/27/16  Educator  Sb  Instruction Review Code  2- meets goals/outcomes      Diabetes: - Individual verbal and written instruction to review signs/symptoms of diabetes, desired ranges of glucose  level fasting, after meals and with exercise. Advice that pre and post exercise glucose checks will be done for 3 sessions at entry of program.    Knowledge Questionnaire Score:     Knowledge Questionnaire Score - 02/27/16 1343      Knowledge Questionnaire Score   Pre Score 20/28  Correct responses reviewed with vebalization of understanding      Core Components/Risk Factors/Patient Goals at Admission:     Personal Goals and Risk Factors at Admission - 02/27/16 1332      Core Components/Risk Factors/Patient Goals on Admission     Weight Management Obesity;Weight Loss;Yes   Intervention Weight Management: Develop a combined nutrition and exercise program designed to reach desired caloric intake, while maintaining appropriate intake of nutrient and fiber, sodium and fats, and appropriate energy expenditure required for the weight goal.;Weight Management: Provide education and appropriate resources to help participant work on and attain dietary goals.;Weight Management/Obesity: Establish reasonable short term and long term weight goals.;Obesity: Provide education and appropriate resources to help participant work on and attain dietary goals.   Admit Weight 154 lb 1.6 oz (69.9 kg)   Goal Weight: Short Term 150 lb (68 kg)   Goal Weight: Long Term 130 lb (59 kg)   Expected Outcomes Short Term: Continue to assess and modify interventions until short term weight is achieved;Long Term: Adherence to nutrition and physical activity/exercise program aimed toward attainment of established weight goal;Weight Loss: Understanding of general recommendations for a balanced deficit meal plan, which promotes 1-2 lb weight loss per week and includes a negative energy balance of [phone] kcal/d   Sedentary Yes   Intervention Provide advice, education, support and counseling about physical activity/exercise needs.;Develop an individualized exercise prescription for aerobic and resistive training based on initial evaluation findings, risk stratification, comorbidities and participant's personal goals.   Expected Outcomes Achievement of increased cardiorespiratory fitness and enhanced flexibility, muscular endurance and strength shown through measurements of functional capacity and personal statement of participant.   Increase Strength and Stamina Yes   Intervention Provide advice, education, support and counseling about physical activity/exercise needs.;Develop an individualized exercise prescription for aerobic and resistive training based on initial  evaluation findings, risk stratification, comorbidities and participant's personal goals.   Expected Outcomes Achievement of increased cardiorespiratory fitness and enhanced flexibility, muscular endurance and strength shown through measurements of functional capacity and personal statement of participant.   Hypertension Yes   Intervention Provide education on lifestyle modifcations including regular physical activity/exercise, weight management, moderate sodium restriction and increased consumption of fresh fruit, vegetables, and low fat dairy, alcohol moderation, and smoking cessation.;Monitor prescription use compliance.   Expected Outcomes Short Term: Continued assessment and intervention until BP is < 140/55mm HG in hypertensive participants. < 130/4mm HG in hypertensive participants with diabetes, heart failure or chronic kidney disease.;Long Term: Maintenance of blood pressure at goal levels.   Lipids Yes   Intervention Provide education and support for participant on nutrition & aerobic/resistive exercise along with prescribed medications to achieve LDL 70mg , HDL >40mg .   Expected Outcomes Short Term: Participant states understanding of desired cholesterol values and is compliant with medications prescribed. Participant is following exercise prescription and nutrition guidelines.;Long Term: Cholesterol controlled with medications as prescribed, with individualized exercise RX and with personalized nutrition plan. Value goals: LDL < 70mg , HDL > 40 mg.   Stress Yes  Current stress is being resolved soon.  Has non-family person living with her and it has become stressful   Intervention Offer individual and/or small group  education and counseling on adjustment to heart disease, stress management and health-related lifestyle change. Teach and support self-help strategies.;Refer participants experiencing significant psychosocial distress to appropriate mental health specialists for further evaluation  and treatment. When possible, include family members and significant others in education/counseling sessions.   Expected Outcomes Short Term: Participant demonstrates changes in health-related behavior, relaxation and other stress management skills, ability to obtain effective social support, and compliance with psychotropic medications if prescribed.;Long Term: Emotional wellbeing is indicated by absence of clinically significant psychosocial distress or social isolation.      Core Components/Risk Factors/Patient Goals Review:    Core Components/Risk Factors/Patient Goals at Discharge (Final Review):    ITP Comments:     ITP Comments    Row Name 02/27/16 1328           ITP Comments Medical Review completed. Initial ITP created.  Diagnosis documentation can be found in CARE EVERYWHERE 01/03/2016 admission [facility]          Comments: Initial ITP

## 2016-03-06 ENCOUNTER — Encounter: Payer: Medicare Other | Admitting: Respiratory Therapy

## 2016-03-06 DIAGNOSIS — Z48812 Encounter for surgical aftercare following surgery on the circulatory system: Secondary | ICD-10-CM | POA: Diagnosis not present

## 2016-03-06 DIAGNOSIS — Z9861 Coronary angioplasty status: Secondary | ICD-10-CM

## 2016-03-06 DIAGNOSIS — I213 ST elevation (STEMI) myocardial infarction of unspecified site: Secondary | ICD-10-CM

## 2016-03-06 NOTE — Progress Notes (Signed)
Daily Session Note  Patient Details  Name: [name] MRN: [id number] Date of Birth: 1950/07/13 Referring Provider:   Flowsheet Row Cardiac Rehab from 02/27/2016 in [facility] Cardiac and Pulmonary Rehab  Referring Provider  Bartholome Bill MD      Encounter Date: 03/06/2016  Check In:     Session Check In - 03/06/16 0845      Check-In   Location [facility]-Cardiac & Pulmonary Rehab   Staff Present Alberteen Sam, MA, ACSM RCEP, Exercise Physiologist;Laureen Owens Shark, BS, RRT, Respiratory Therapist;Susanne Bice, RN, BSN, CCRP   Supervising physician immediately available to respond to emergencies See telemetry face sheet for immediately available ER MD   Medication changes reported     No   Fall or balance concerns reported    No   Warm-up and Cool-down Performed on first and last piece of equipment   Resistance Training Performed Yes     Pain Assessment   Currently in Pain? No/denies   Multiple Pain Sites No         Goals Met:  Proper associated with RPD/PD & O2 Sat Independence with exercise equipment Exercise tolerated well No report of cardiac concerns or symptoms Strength training completed today  Goals Unmet:  Not Applicable  Comments: Pt able to follow exercise prescription today without complaint.  Will continue to monitor for progression.   Dr. Emily Filbert is Medical Director for [facility].

## 2016-03-06 NOTE — Progress Notes (Signed)
Daily Session Note  Patient Details  Name: [name] MRN: [id number] Date of Birth: Sep 23, 1950 Referring Provider:   Flowsheet Row Cardiac Rehab from 02/27/2016 in [facility] Cardiac and Pulmonary Rehab  Referring Provider  Bartholome Bill MD      Encounter Date: 03/06/2016  Check In:     Session Check In - 03/06/16 0845      Check-In   Location [facility]-Cardiac & Pulmonary Rehab   Staff Present Alberteen Sam, MA, ACSM RCEP, Exercise Physiologist;Laureen Owens Shark, BS, RRT, Respiratory Therapist;Susanne Bice, RN, BSN, CCRP   Supervising physician immediately available to respond to emergencies See telemetry face sheet for immediately available ER MD   Medication changes reported     No   Fall or balance concerns reported    No   Warm-up and Cool-down Performed on first and last piece of equipment   Resistance Training Performed Yes     Pain Assessment   Currently in Pain? No/denies   Multiple Pain Sites No         Goals Met:  Exercise tolerated well Personal goals reviewed No report of cardiac concerns or symptoms Strength training completed today  Goals Unmet:  Not Applicable  Comments: First full day of exercise!  Patient was oriented to gym and equipment including functions, settings, policies, and procedures.  Patient's individual exercise prescription and treatment plan were reviewed.  All starting workloads were established based on the results of the 6 minute walk test done at initial orientation visit.  The plan for exercise progression was also introduced and progression will be customized based on patient's performance and goals.    Dr. Emily Filbert is Medical Director for [facility].

## 2016-03-07 ENCOUNTER — Ambulatory Visit: Payer: Self-pay | Attending: Cardiology | Admitting: Physical Therapy

## 2016-03-07 DIAGNOSIS — M25551 Pain in right hip: Secondary | ICD-10-CM | POA: Insufficient documentation

## 2016-03-07 DIAGNOSIS — Z48812 Encounter for surgical aftercare following surgery on the circulatory system: Secondary | ICD-10-CM | POA: Diagnosis not present

## 2016-03-07 DIAGNOSIS — I213 ST elevation (STEMI) myocardial infarction of unspecified site: Secondary | ICD-10-CM

## 2016-03-07 DIAGNOSIS — Z9861 Coronary angioplasty status: Secondary | ICD-10-CM

## 2016-03-07 NOTE — Therapy (Signed)
[place] MAIN [facility] SERVICES [address] [place], [place], [address] Phone: [phone]   Fax:  [phone]  Patient Details  Name: [name] MRN: [id number] Date of Birth: 1950/11/15 Referring Provider:  No ref. provider found  Encounter Date: 03/07/2016 PT/OT/SLP Screening Form   Time: in 11:33     Time out 12:00   Complaint : right knee pain, right groin pain and right buttocks pain Past Medical Hx:  Lupus, heart problems high BP, 2 stents,bladder surgery, neuropathy B feet Injury Date: right knee pain that began one month ago after she fell  Pain Scale: right knee pain  6 / 10 with movement, no pain when no movement Patient's phone number: [phone]  Hx (this occurrence):This patient is 17 and is retired.  One month ago she fell while walking in the woods with a fall and the knee pain came back. She originally began with right knee pain a year ago after yoga. She feels like her knee will give out. It will get a shooting pain and then it gives out.      Assessment:  Patient has right hip pain that grabs her after standing up and beginning to take a step. She has 4/5 strength right hip . She has no tenderness to knee with palpation and + right hip scour, + right hip FABER, and decreased right hip ROM with pain in hip flex and IR and decreased hip IR and hip flex.  Right knee palpation with no tenderness, no strength deficits for right knee, negative right Apley test . Patient reports numbness BLE feet  Patient ambulates with no AD and has occasional right hip pain that makes her limp and RLE gives away due to the pain and then it goes away.      Recommendations:  PT evaluation and treat for right hip pain   Comments:    [x]  Patient would benefit from an MD referral [x]  Patient would benefit from a full PT/OT/ SLP evaluation and treatment. []  No intervention recommended at this time.  Alanson Puls, PT,  DPT [place], Minette Headland S 03/07/2016, 11:36 AM  [place] MAIN [facility] SERVICES [address] [place], [place], [address] Phone: [phone]   Fax:  [phone]

## 2016-03-07 NOTE — Progress Notes (Signed)
Daily Session Note  Patient Details  Name: [name] MRN: [id number] Date of Birth: 1950/03/02 Referring Provider:   Flowsheet Row Cardiac Rehab from 02/27/2016 in [facility]  Referring Provider  Bartholome Bill MD      Encounter Date: 03/07/2016  Check In:     Session Check In - 03/07/16 0827      Check-In   Location [facility]-Cardiac & Pulmonary Rehab   Staff Present Alberteen Sam, MA, ACSM RCEP, Exercise Physiologist;Susanne Bice, RN, BSN, Lance Sell, BA, ACSM CEP, Exercise Physiologist   Supervising physician immediately available to respond to emergencies See telemetry face sheet for immediately available ER MD   Medication changes reported     No   Fall or balance concerns reported    No   Warm-up and Cool-down Performed on first and last piece of equipment   Resistance Training Performed Yes   VAD Patient? No     Pain Assessment   Currently in Pain? No/denies   Multiple Pain Sites No           Exercise Prescription Changes - 03/06/16 1300      Exercise Review   Progression --  First Full Day of Exercise     Response to Exercise   Blood Pressure (Admit) 124/70   Blood Pressure (Exercise) 126/70   Blood Pressure (Exit) 120/60   Heart Rate (Admit) 83 bpm   Heart Rate (Exercise) 127 bpm   Heart Rate (Exit) 70 bpm   Rating of Perceived Exertion (Exercise) 14   Symptoms none   Duration Progress to 45 minutes of aerobic exercise without signs/symptoms of physical distress   Intensity THRR unchanged     Progression   Progression Continue to progress workloads to maintain intensity without signs/symptoms of physical distress.   Average METs 2.84     Resistance Training   Training Prescription Yes   Weight 3 lbs   Reps 10-15     Interval Training   Interval Training No     Treadmill   MPH 2.6   Grade 0.5   Minutes 15   METs 3.17     NuStep   Level 3   Minutes 15   METs 2.5      Goals Met:  Independence with exercise  equipment Exercise tolerated well No report of cardiac concerns or symptoms Strength training completed today  Goals Unmet:  Not Applicable  Comments: Pt able to follow exercise prescription today without complaint.  Will continue to monitor for progression.    Dr. Emily Filbert is Medical Director for [facility] and [facility].

## 2016-03-08 ENCOUNTER — Encounter: Payer: Medicare Other | Admitting: *Deleted

## 2016-03-08 DIAGNOSIS — Z48812 Encounter for surgical aftercare following surgery on the circulatory system: Secondary | ICD-10-CM | POA: Diagnosis not present

## 2016-03-08 DIAGNOSIS — Z9861 Coronary angioplasty status: Secondary | ICD-10-CM

## 2016-03-08 DIAGNOSIS — I213 ST elevation (STEMI) myocardial infarction of unspecified site: Secondary | ICD-10-CM

## 2016-03-08 NOTE — Progress Notes (Signed)
Daily Session Note  Patient Details  Name: [name] MRN: [id number] Date of Birth: 06/15/1950 Referring Provider:   Flowsheet Row Cardiac Rehab from 02/27/2016 in [facility] Cardiac and Pulmonary Rehab  Referring Provider  Fath, Kenneth MD      Encounter Date: 03/08/2016  Check In:     Session Check In - 03/08/16 0834      Check-In   Location [facility]-Cardiac & Pul[facility] Re[facility]   Staff Present Jessica Hawkins, MA, ACSM RCEP, Exercise Physiologist;Patricia Surles RN BSN;Amanda Sommer, BA, ACSM CEP, Exercise Physiologist   Supervising physician immediately available to respond to emergencies See telemetry face sheet for immediately available ER MD   Medication changes reported     No   Fall or balance concerns reported    No   Warm-up and Cool-down Performed on first and last piece of equipment   Resistance Training Performed Yes   VAD Patient? No     Pain Assessment   Currently in Pain? No/denies   Multiple Pain Sites No         Goals Met:  Independence with exercise equipment Exercise tolerated well No report of cardiac concerns or symptoms Strength training completed today  Goals Unmet:  Not Applicable  Comments: Pt able to follow exercise prescription today without complaint.  Will continue to monitor for progression.    Dr. Mark Miller is Medical Director for [facility] and [facility]. 

## 2016-03-13 ENCOUNTER — Encounter: Payer: Medicare Other | Admitting: Respiratory Therapy

## 2016-03-13 DIAGNOSIS — Z48812 Encounter for surgical aftercare following surgery on the circulatory system: Secondary | ICD-10-CM | POA: Diagnosis not present

## 2016-03-13 DIAGNOSIS — Z9861 Coronary angioplasty status: Secondary | ICD-10-CM

## 2016-03-13 DIAGNOSIS — I213 ST elevation (STEMI) myocardial infarction of unspecified site: Secondary | ICD-10-CM

## 2016-03-13 NOTE — Progress Notes (Signed)
Daily Session Note  Patient Details  Name: [name] MRN: [id number] Date of Birth: 1950-05-13 Referring Provider:   Flowsheet Row Cardiac Rehab from 02/27/2016 in [facility] Cardiac and Pulmonary Rehab  Referring Provider  Bartholome Bill MD      Encounter Date: 03/13/2016  Check In:     Session Check In - 03/13/16 0839      Check-In   Location [facility]-Cardiac & Pulmonary Rehab   Staff Present Alberteen Sam, MA, ACSM RCEP, Exercise Physiologist;Susanne Bice, RN, BSN, CCRP;Laureen Owens Shark, BS, RRT, Respiratory Therapist   Supervising physician immediately available to respond to emergencies See telemetry face sheet for immediately available ER MD   Medication changes reported     No   Fall or balance concerns reported    No   Warm-up and Cool-down Performed on first and last piece of equipment   Resistance Training Performed Yes   VAD Patient? No     Pain Assessment   Currently in Pain? No/denies   Multiple Pain Sites No         Goals Met:  Independence with exercise equipment Exercise tolerated well No report of cardiac concerns or symptoms Strength training completed today  Goals Unmet:  Not Applicable  Comments: Pt able to follow exercise prescription today without complaint.  Will continue to monitor for progression.    Dr. Emily Filbert is Medical Director for [facility] and [facility].

## 2016-03-14 ENCOUNTER — Encounter: Payer: Self-pay | Admitting: *Deleted

## 2016-03-14 DIAGNOSIS — Z9861 Coronary angioplasty status: Secondary | ICD-10-CM

## 2016-03-14 DIAGNOSIS — I213 ST elevation (STEMI) myocardial infarction of unspecified site: Secondary | ICD-10-CM

## 2016-03-14 DIAGNOSIS — Z48812 Encounter for surgical aftercare following surgery on the circulatory system: Secondary | ICD-10-CM | POA: Diagnosis not present

## 2016-03-14 NOTE — Progress Notes (Signed)
Cardiac Individual Treatment Plan  Patient Details  Name: [name] MRN: [id number] Date of Birth: 1950-07-11 Referring Provider:   Flowsheet Row Cardiac Rehab from 02/27/2016 in [facility] Cardiac and Pulmonary Rehab  Referring Provider  Bartholome Bill MD      Initial Encounter Date:  Flowsheet Row Cardiac Rehab from 02/27/2016 in [facility] Cardiac and Pulmonary Rehab  Date  02/27/16  Referring Provider  Bartholome Bill MD      Visit Diagnosis: ST elevation myocardial infarction (STEMI), unspecified artery ([place])  S/P PTCA (percutaneous transluminal coronary angioplasty)  Patient's Home Medications on Admission:  Current Outpatient Prescriptions:  .  amlodipine-benazepril (LOTREL) 2.5-10 MG per capsule, Take 1 capsule by mouth daily., Disp: , Rfl:  .  clopidogrel (PLAVIX) 75 MG tablet, Take 75 mg by mouth daily., Disp: , Rfl:  .  cyclobenzaprine (FLEXERIL) 10 MG tablet, Take 1 tablet (10 mg total) by mouth 3 (three) times daily as needed for muscle spasms., Disp: 30 tablet, Rfl: 0 .  EPINEPHrine 1 MG/ML SOLN, Inject as directed., Disp: , Rfl:  .  estradiol (ESTRACE) 0.1 MG/GM vaginal cream, Place 1 Applicatorful vaginally 3 (three) times a week., Disp: , Rfl:  .  hydroxychloroquine (PLAQUENIL) 200 MG tablet, Take by mouth daily., Disp: , Rfl:  .  METOPROLOL TARTRATE PO, Take 25 mg by mouth., Disp: , Rfl:  .  oxybutynin (DITROPAN) 5 MG tablet, Take 5 mg by mouth 3 (three) times daily., Disp: , Rfl:  .  traMADol (ULTRAM) 50 MG tablet, Take 1 tablet (50 mg total) by mouth every 6 (six) hours as needed., Disp: 9 tablet, Rfl: 0 .  venlafaxine (EFFEXOR) 75 MG tablet, Take 75 mg by mouth 2 (two) times daily., Disp: , Rfl:   Past Medical History: Past Medical History:  Diagnosis Date  . Anxiety   . Cystocele   . Hypertension   . PMB (postmenopausal bleeding)   . Rectocele   . SUI (stress urinary incontinence, female)   . Unstable bladder   . Urination frequency   . 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 Vaginal atrophy   . Vaginal itching     Tobacco Use: History  Smoking Status  . Former Smoker  . Quit date: 02/13/1983  Smokeless Tobacco  . Not on file    Labs: Recent Review Flowsheet Data    There is no flowsheet data to display.       Exercise Target Goals:    Exercise Program Goal: Individual exercise prescription set with THRR, safety & activity barriers. Participant demonstrates ability to understand and report RPE using BORG scale, to self-measure pulse accurately, and to acknowledge the importance of the exercise prescription.  Exercise Prescription Goal: Starting with aerobic activity 30 plus minutes a day, 3 days per week for initial exercise prescription. Provide home exercise prescription and guidelines that participant acknowledges understanding prior to discharge.  Activity Barriers & Risk Stratification:     Activity Barriers & Cardiac Risk Stratification - 02/27/16 1342      Activity Barriers & Cardiac Risk Stratification   Activity Barriers None      6 Minute Walk:     6 Minute Walk    Row Name 02/27/16 1623         6 Minute Walk   Phase Initial     Distance 1530 feet     Walk Time 6 minutes     # of Rest Breaks 0     MPH 2.9  METS 3.67     RPE 10     VO2 Peak 12.8     Symptoms No     Resting HR 7 bpm     Resting BP 126/54     Max Ex. HR 113 bpm     Max Ex. BP 134/70     2 Minute Post BP 126/64        Initial Exercise Prescription:     Initial Exercise Prescription - 02/27/16 1600      Date of Initial Exercise RX and Referring Provider   Date 02/27/16   Referring Provider Bartholome Bill MD     Treadmill   MPH 2.6   Grade 0.5   Minutes 15   METs 3.17     NuStep   Level 3   Minutes 15   METs 2     Recumbant Elliptical   Level 2   RPM 50   Minutes 15   METs 2     Prescription Details   Frequency (times per week) 3   Duration Progress to 45 minutes of aerobic exercise without  signs/symptoms of physical distress     Intensity   THRR 40-80% of Max Heartrate 100-137   Ratings of Perceived Exertion 11-15   Perceived Dyspnea 0-4     Progression   Progression Continue to progress workloads to maintain intensity without signs/symptoms of physical distress.     Resistance Training   Training Prescription Yes   Weight 3 lbs   Reps 10-15      Perform Capillary Blood Glucose checks as needed.  Exercise Prescription Changes:     Exercise Prescription Changes    Row Name 02/27/16 1500 03/06/16 1300           Exercise Review   Progression -  walk test results -  First Full Day of Exercise        Response to Exercise   Blood Pressure (Admit) 126/54 124/70      Blood Pressure (Exercise) 134/70 126/70      Blood Pressure (Exit) 126/64 120/60      Heart Rate (Admit) 64 bpm 83 bpm      Heart Rate (Exercise) 113 bpm 127 bpm      Heart Rate (Exit) 69 bpm 70 bpm      Oxygen Saturation (Admit) 98 %  -      Oxygen Saturation (Exercise) 100 %  -      Rating of Perceived Exertion (Exercise) 10 14      Symptoms  - none      Duration  - Progress to 45 minutes of aerobic exercise without signs/symptoms of physical distress      Intensity  - THRR unchanged        Progression   Progression  - Continue to progress workloads to maintain intensity without signs/symptoms of physical distress.      Average METs  - 2.84        Resistance Training   Training Prescription  - Yes      Weight  - 3 lbs      Reps  - 10-15        Interval Training   Interval Training  - No        Treadmill   MPH  - 2.6      Grade  - 0.5      Minutes  - 15      METs  - 3.17  NuStep   Level  - 3      Minutes  - 15      METs  - 2.5         Exercise Comments:     Exercise Comments    Row Name 03/06/16 1029           Exercise Comments  First full day of exercise!  Patient was oriented to gym and equipment including functions, settings, policies, and procedures.   Patient's individual exercise prescription and treatment plan were reviewed.  All starting workloads were established based on the results of the 6 minute walk test done at initial orientation visit.  The plan for exercise progression was also introduced and progression will be customized based on patient's performance and goals.          Discharge Exercise Prescription (Final Exercise Prescription Changes):     Exercise Prescription Changes - 03/06/16 1300      Exercise Review   Progression --  First Full Day of Exercise     Response to Exercise   Blood Pressure (Admit) 124/70   Blood Pressure (Exercise) 126/70   Blood Pressure (Exit) 120/60   Heart Rate (Admit) 83 bpm   Heart Rate (Exercise) 127 bpm   Heart Rate (Exit) 70 bpm   Rating of Perceived Exertion (Exercise) 14   Symptoms none   Duration Progress to 45 minutes of aerobic exercise without signs/symptoms of physical distress   Intensity THRR unchanged     Progression   Progression Continue to progress workloads to maintain intensity without signs/symptoms of physical distress.   Average METs 2.84     Resistance Training   Training Prescription Yes   Weight 3 lbs   Reps 10-15     Interval Training   Interval Training No     Treadmill   MPH 2.6   Grade 0.5   Minutes 15   METs 3.17     NuStep   Level 3   Minutes 15   METs 2.5      Nutrition:  Target Goals: Understanding of nutrition guidelines, daily intake of sodium <1594m, cholesterol <2016m calories 30% from fat and 7% or less from saturated fats, daily to have 5 or more servings of fruits and vegetables.  Biometrics:     Pre Biometrics - 02/27/16 1624      Pre Biometrics   Height 5' 3.2" (1.605 m)   Weight 154 lb 8 oz (70.1 kg)   Waist Circumference 33 inches   Hip Circumference 40 inches   Waist to Hip Ratio 0.82 %   BMI (Calculated) 27.3   Single Leg Stand 28.51 seconds       Nutrition Therapy Plan and Nutrition Goals:      Nutrition Therapy & Goals - 02/27/16 1332      Intervention Plan   Intervention Prescribe, educate and counsel regarding individualized specific dietary modifications aiming towards targeted core components such as weight, hypertension, lipid management, diabetes, heart failure and other comorbidities.   Expected Outcomes Short Term Goal: Understand basic principles of dietary content, such as calories, fat, sodium, cholesterol and nutrients.;Short Term Goal: A plan has been developed with personal nutrition goals set during dietitian appointment.;Long Term Goal: Adherence to prescribed nutrition plan.      Nutrition Discharge: Rate Your Plate Scores:     Nutrition Assessments - 02/27/16 1332      MEDFICTS Scores   Pre Score 28      Nutrition Goals Re-Evaluation:  Psychosocial: Target Goals: Acknowledge presence or absence of depression, maximize coping skills, provide positive support system. Participant is able to verbalize types and ability to use techniques and skills needed for reducing stress and depression.  Initial Review & Psychosocial Screening:     Initial Psych Review & Screening - 02/27/16 1344      Initial Review   Current issues with Current Sleep Concerns  unable to get to sleep.   Sleep pattern to bed at 2 am sleeps until 10 am      Quality of Life Scores:     Quality of Life - 02/27/16 1340      Quality of Life Scores   Health/Function Pre 19.33 %   Socioeconomic Pre 24 %   Psych/Spiritual Pre 30 %   Family Pre 28.8 %   GLOBAL Pre 24 %      PHQ-9: Recent Review Flowsheet Data    Depression screen [facility] 2/9 02/27/2016   Decreased Interest 2   Down, Depressed, Hopeless 0   PHQ - 2 Score 2   Altered sleeping 3   Tired, decreased energy 1   Change in appetite 2   Feeling bad or failure about yourself  1   Trouble concentrating 2   Moving slowly or fidgety/restless 0   Suicidal thoughts 0   PHQ-9 Score 11   Difficult doing work/chores  Somewhat difficult      Psychosocial Evaluation and Intervention:     Psychosocial Evaluation - 03/06/16 0937      Psychosocial Evaluation & Interventions   Interventions Encouraged to exercise with the program and follow exercise prescription;Relaxation education;Stress management education   Comments Counselor met with Ms. C today for initial psychosocial evaluation.  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  She has a strong support system with a spouse; adult son and active participation in her local church community.  Ms. C has multiple health issues including Lupus, neuropathy and knee problems in addition to her cardiac health concerns.  She reports having sleep problems both getting to sleep and even sleeping too much at times.  She states her appetite is good as well.  She admits to a history of anxiety and is on Effexor daily for this.  Ms. C states she is typically in a positive mood and other than her health she has minimal stress at this time.  Her goals for this program are to increase her stamina and strength and possibly lose some weight.  Staff will need to monitor Ms. C's sleep patterns to see if they improve once she gets on a better schedule with consistent exercise and diet.  She may need further evaluation if this does not improve.       Continued Psychosocial Services Needed Yes  Follow up on sleep - to see if improves with exercise and improved schedule and diet.      Psychosocial Re-Evaluation:   Vocational Rehabilitation: Provide vocational rehab assistance to qualifying candidates.   Vocational Rehab Evaluation & Intervention:     Vocational Rehab - 02/27/16 1343      Initial Vocational Rehab Evaluation & Intervention   Assessment shows need for Vocational Rehabilitation No      Education: Education Goals: Education classes will be provided on a weekly basis, covering required topics. Participant will  state understanding/return demonstration of topics presented.  Learning Barriers/Preferences:     Learning Barriers/Preferences - 02/27/16 1342  Learning Barriers/Preferences   Learning Barriers --  Attention deficit   Learning Preferences Pictoral;Skilled Demonstration;Written Material;None      Education Topics: General Nutrition Guidelines/Fats and Fiber: -Group instruction provided by verbal, written material, models and posters to present the general guidelines for heart healthy nutrition. Gives an explanation and review of dietary fats and fiber.   Controlling Sodium/Reading Food Labels: -Group verbal and written material supporting the discussion of sodium use in heart healthy nutrition. Review and explanation with models, verbal and written materials for utilization of the food label.   Exercise Physiology & Risk Factors: - Group verbal and written instruction with models to review the exercise physiology of the cardiovascular system and associated critical values. Details cardiovascular disease risk factors and the goals associated with each risk factor. Flowsheet Row Cardiac Rehab from 03/13/2016 in [facility] Cardiac and Pulmonary Rehab  Date  03/06/16  Educator  LB  Instruction Review Code  2- meets goals/outcomes      Aerobic Exercise & Resistance Training: - Gives group verbal and written discussion on the health impact of inactivity. On the components of aerobic and resistive training programs and the benefits of this training and how to safely progress through these programs. Flowsheet Row Cardiac Rehab from 03/13/2016 in [facility] Cardiac and Pulmonary Rehab  Date  03/07/16  Educator  [facility]  Instruction Review Code  2- meets goals/outcomes      Flexibility, Balance, General Exercise Guidelines: - Provides group verbal and written instruction on the benefits of flexibility and balance training programs. Provides general exercise guidelines with specific guidelines to  those with heart or lung disease. Demonstration and skill practice provided. Flowsheet Row Cardiac Rehab from 03/13/2016 in [facility] Cardiac and Pulmonary Rehab  Date  03/13/16  Educator  [facility]  Instruction Review Code  2- meets goals/outcomes      Stress Management: - Provides group verbal and written instruction about the health risks of elevated stress, cause of high stress, and healthy ways to reduce stress.   Depression: - Provides group verbal and written instruction on the correlation between heart/lung disease and depressed mood, treatment options, and the stigmas associated with seeking treatment.   Anatomy & Physiology of the Heart: - Group verbal and written instruction and models provide basic cardiac anatomy and physiology, with the coronary electrical and arterial systems. Review of: AMI, Angina, Valve disease, Heart Failure, Cardiac Arrhythmia, Pacemakers, and the ICD.   Cardiac Procedures: - Group verbal and written instruction and models to describe the testing methods done to diagnose heart disease. Reviews the outcomes of the test results. Describes the treatment choices: Medical Management, Angioplasty, or Coronary Bypass Surgery.   Cardiac Medications: - Group verbal and written instruction to review commonly prescribed medications for heart disease. Reviews the medication, class of the drug, and side effects. Includes the steps to properly store meds and maintain the prescription regimen.   Go Sex-Intimacy & Heart Disease, Get SMART - Goal Setting: - Group verbal and written instruction through game format to discuss heart disease and the return to sexual intimacy. Provides group verbal and written material to discuss and apply goal setting through the application of the S.M.A.R.T. Method.   Other Matters of the Heart: - Provides group verbal, written materials and models to describe Heart Failure, Angina, Valve Disease, and Diabetes in the realm of heart disease.  Includes description of the disease process and treatment options available to the cardiac patient.   Exercise & Equipment Safety: - Individual verbal instruction and demonstration  of equipment use and safety with use of the equipment. Flowsheet Row Cardiac Rehab from 03/13/2016 in [facility] Cardiac and Pulmonary Rehab  Date  02/27/16  Educator  SB  Instruction Review Code  2- meets goals/outcomes      Infection Prevention: - Provides verbal and written material to individual with discussion of infection control including proper hand washing and proper equipment cleaning during exercise session. Flowsheet Row Cardiac Rehab from 03/13/2016 in [facility] Cardiac and Pulmonary Rehab  Date  02/27/16  Educator  SB  Instruction Review Code  2- meets goals/outcomes      Falls Prevention: - Provides verbal and written material to individual with discussion of falls prevention and safety. Flowsheet Row Cardiac Rehab from 03/13/2016 in [facility] Cardiac and Pulmonary Rehab  Date  02/27/16  Educator  Sb  Instruction Review Code  2- meets goals/outcomes      Diabetes: - Individual verbal and written instruction to review signs/symptoms of diabetes, desired ranges of glucose level fasting, after meals and with exercise. Advice that pre and post exercise glucose checks will be done for 3 sessions at entry of program.    Knowledge Questionnaire Score:     Knowledge Questionnaire Score - 02/27/16 1343      Knowledge Questionnaire Score   Pre Score 20/28  Correct responses reviewed with vebalization of understanding      Core Components/Risk Factors/Patient Goals at Admission:     Personal Goals and Risk Factors at Admission - 02/27/16 1332      Core Components/Risk Factors/Patient Goals on Admission    Weight Management Obesity;Weight Loss;Yes   Intervention Weight Management: Develop a combined nutrition and exercise program designed to reach desired caloric intake, while maintaining appropriate  intake of nutrient and fiber, sodium and fats, and appropriate energy expenditure required for the weight goal.;Weight Management: Provide education and appropriate resources to help participant work on and attain dietary goals.;Weight Management/Obesity: Establish reasonable short term and long term weight goals.;Obesity: Provide education and appropriate resources to help participant work on and attain dietary goals.   Admit Weight 154 lb 1.6 oz (69.9 kg)   Goal Weight: Short Term 150 lb (68 kg)   Goal Weight: Long Term 130 lb (59 kg)   Expected Outcomes Short Term: Continue to assess and modify interventions until short term weight is achieved;Long Term: Adherence to nutrition and physical activity/exercise program aimed toward attainment of established weight goal;Weight Loss: Understanding of general recommendations for a balanced deficit meal plan, which promotes 1-2 lb weight loss per week and includes a negative energy balance of [phone] kcal/d   Sedentary Yes   Intervention Provide advice, education, support and counseling about physical activity/exercise needs.;Develop an individualized exercise prescription for aerobic and resistive training based on initial evaluation findings, risk stratification, comorbidities and participant's personal goals.   Expected Outcomes Achievement of increased cardiorespiratory fitness and enhanced flexibility, muscular endurance and strength shown through measurements of functional capacity and personal statement of participant.   Increase Strength and Stamina Yes   Intervention Provide advice, education, support and counseling about physical activity/exercise needs.;Develop an individualized exercise prescription for aerobic and resistive training based on initial evaluation findings, risk stratification, comorbidities and participant's personal goals.   Expected Outcomes Achievement of increased cardiorespiratory fitness and enhanced flexibility, muscular  endurance and strength shown through measurements of functional capacity and personal statement of participant.   Hypertension Yes   Intervention Provide education on lifestyle modifcations including regular physical activity/exercise, weight management, moderate sodium restriction and increased consumption  of fresh fruit, vegetables, and low fat dairy, alcohol moderation, and smoking cessation.;Monitor prescription use compliance.   Expected Outcomes Short Term: Continued assessment and intervention until BP is < 140/62m HG in hypertensive participants. < 130/856mHG in hypertensive participants with diabetes, heart failure or chronic kidney disease.;Long Term: Maintenance of blood pressure at goal levels.   Lipids Yes   Intervention Provide education and support for participant on nutrition & aerobic/resistive exercise along with prescribed medications to achieve LDL <7065mHDL >49m37m Expected Outcomes Short Term: Participant states understanding of desired cholesterol values and is compliant with medications prescribed. Participant is following exercise prescription and nutrition guidelines.;Long Term: Cholesterol controlled with medications as prescribed, with individualized exercise RX and with personalized nutrition plan. Value goals: LDL < 70mg13mL > 40 mg.   Stress Yes  Current stress is being resolved soon.  Has non-family person living with her and it has become stressful   Intervention Offer individual and/or small group education and counseling on adjustment to heart disease, stress management and health-related lifestyle change. Teach and support self-help strategies.;Refer participants experiencing significant psychosocial distress to appropriate mental health specialists for further evaluation and treatment. When possible, include family members and significant others in education/counseling sessions.   Expected Outcomes Short Term: Participant demonstrates changes in health-related  behavior, relaxation and other stress management skills, ability to obtain effective social support, and compliance with psychotropic medications if prescribed.;Long Term: Emotional wellbeing is indicated by absence of clinically significant psychosocial distress or social isolation.      Core Components/Risk Factors/Patient Goals Review:    Core Components/Risk Factors/Patient Goals at Discharge (Final Review):    ITP Comments:     ITP Comments    Row Name 02/27/16 1328 03/14/16 0555         ITP Comments Medical Review completed. Initial ITP created.  Diagnosis documentation can be found in C[facility] EVERYWHERE [facility] Continue with ITP unless directed changes per Medical Director review.         Comments:

## 2016-03-14 NOTE — Progress Notes (Signed)
Cardiac Individual Treatment Plan  Patient Details  Name: [name] MRN: [id number] Date of Birth: January 03, 1951 Referring Provider:   Flowsheet Row Cardiac Rehab from 02/27/2016 in [facility] Cardiac and Pulmonary Rehab  Referring Provider  Bartholome Bill MD      Initial Encounter Date:  Flowsheet Row Cardiac Rehab from 02/27/2016 in [facility] Cardiac and Pulmonary Rehab  Date  02/27/16  Referring Provider  Bartholome Bill MD      Visit Diagnosis: ST elevation myocardial infarction (STEMI), unspecified artery ([place])  S/P PTCA (percutaneous transluminal coronary angioplasty)  Patient's Home Medications on Admission:  Current Outpatient Prescriptions:  .  amlodipine-benazepril (LOTREL) 2.5-10 MG per capsule, Take 1 capsule by mouth daily., Disp: , Rfl:  .  clopidogrel (PLAVIX) 75 MG tablet, Take 75 mg by mouth daily., Disp: , Rfl:  .  cyclobenzaprine (FLEXERIL) 10 MG tablet, Take 1 tablet (10 mg total) by mouth 3 (three) times daily as needed for muscle spasms., Disp: 30 tablet, Rfl: 0 .  EPINEPHrine 1 MG/ML SOLN, Inject as directed., Disp: , Rfl:  .  estradiol (ESTRACE) 0.1 MG/GM vaginal cream, Place 1 Applicatorful vaginally 3 (three) times a week., Disp: , Rfl:  .  hydroxychloroquine (PLAQUENIL) 200 MG tablet, Take by mouth daily., Disp: , Rfl:  .  METOPROLOL TARTRATE PO, Take 25 mg by mouth., Disp: , Rfl:  .  oxybutynin (DITROPAN) 5 MG tablet, Take 5 mg by mouth 3 (three) times daily., Disp: , Rfl:  .  traMADol (ULTRAM) 50 MG tablet, Take 1 tablet (50 mg total) by mouth every 6 (six) hours as needed., Disp: 9 tablet, Rfl: 0 .  venlafaxine (EFFEXOR) 75 MG tablet, Take 75 mg by mouth 2 (two) times daily., Disp: , Rfl:   Past Medical History: Past Medical History:  Diagnosis Date  . Anxiety   . Cystocele   . Hypertension   . PMB (postmenopausal bleeding)   . Rectocele   . SUI (stress urinary incontinence, female)   . Unstable bladder   . Urination frequency   . 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 Vaginal atrophy   . Vaginal itching     Tobacco Use: History  Smoking Status  . Former Smoker  . Quit date: 02/13/1983  Smokeless Tobacco  . Not on file    Labs: Recent Review Flowsheet Data    There is no flowsheet data to display.       Exercise Target Goals:    Exercise Program Goal: Individual exercise prescription set with THRR, safety & activity barriers. Participant demonstrates ability to understand and report RPE using BORG scale, to self-measure pulse accurately, and to acknowledge the importance of the exercise prescription.  Exercise Prescription Goal: Starting with aerobic activity 30 plus minutes a day, 3 days per week for initial exercise prescription. Provide home exercise prescription and guidelines that participant acknowledges understanding prior to discharge.  Activity Barriers & Risk Stratification:     Activity Barriers & Cardiac Risk Stratification - 02/27/16 1342      Activity Barriers & Cardiac Risk Stratification   Activity Barriers None      6 Minute Walk:     6 Minute Walk    Row Name 02/27/16 1623         6 Minute Walk   Phase Initial     Distance 1530 feet     Walk Time 6 minutes     # of Rest Breaks 0     MPH 2.9  METS 3.67     RPE 10     VO2 Peak 12.8     Symptoms No     Resting HR 7 bpm     Resting BP 126/54     Max Ex. HR 113 bpm     Max Ex. BP 134/70     2 Minute Post BP 126/64        Initial Exercise Prescription:     Initial Exercise Prescription - 02/27/16 1600      Date of Initial Exercise RX and Referring Provider   Date 02/27/16   Referring Provider Bartholome Bill MD     Treadmill   MPH 2.6   Grade 0.5   Minutes 15   METs 3.17     NuStep   Level 3   Minutes 15   METs 2     Recumbant Elliptical   Level 2   RPM 50   Minutes 15   METs 2     Prescription Details   Frequency (times per week) 3   Duration Progress to 45 minutes of aerobic exercise without  signs/symptoms of physical distress     Intensity   THRR 40-80% of Max Heartrate 100-137   Ratings of Perceived Exertion 11-15   Perceived Dyspnea 0-4     Progression   Progression Continue to progress workloads to maintain intensity without signs/symptoms of physical distress.     Resistance Training   Training Prescription Yes   Weight 3 lbs   Reps 10-15      Perform Capillary Blood Glucose checks as needed.  Exercise Prescription Changes:     Exercise Prescription Changes    Row Name 02/27/16 1500 03/06/16 1300           Exercise Review   Progression -  walk test results -  First Full Day of Exercise        Response to Exercise   Blood Pressure (Admit) 126/54 124/70      Blood Pressure (Exercise) 134/70 126/70      Blood Pressure (Exit) 126/64 120/60      Heart Rate (Admit) 64 bpm 83 bpm      Heart Rate (Exercise) 113 bpm 127 bpm      Heart Rate (Exit) 69 bpm 70 bpm      Oxygen Saturation (Admit) 98 %  -      Oxygen Saturation (Exercise) 100 %  -      Rating of Perceived Exertion (Exercise) 10 14      Symptoms  - none      Duration  - Progress to 45 minutes of aerobic exercise without signs/symptoms of physical distress      Intensity  - THRR unchanged        Progression   Progression  - Continue to progress workloads to maintain intensity without signs/symptoms of physical distress.      Average METs  - 2.84        Resistance Training   Training Prescription  - Yes      Weight  - 3 lbs      Reps  - 10-15        Interval Training   Interval Training  - No        Treadmill   MPH  - 2.6      Grade  - 0.5      Minutes  - 15      METs  - 3.17  NuStep   Level  - 3      Minutes  - 15      METs  - 2.5         Exercise Comments:     Exercise Comments    Row Name 03/06/16 1029           Exercise Comments  First full day of exercise!  Patient was oriented to gym and equipment including functions, settings, policies, and procedures.   Patient's individual exercise prescription and treatment plan were reviewed.  All starting workloads were established based on the results of the 6 minute walk test done at initial orientation visit.  The plan for exercise progression was also introduced and progression will be customized based on patient's performance and goals.          Discharge Exercise Prescription (Final Exercise Prescription Changes):     Exercise Prescription Changes - 03/06/16 1300      Exercise Review   Progression --  First Full Day of Exercise     Response to Exercise   Blood Pressure (Admit) 124/70   Blood Pressure (Exercise) 126/70   Blood Pressure (Exit) 120/60   Heart Rate (Admit) 83 bpm   Heart Rate (Exercise) 127 bpm   Heart Rate (Exit) 70 bpm   Rating of Perceived Exertion (Exercise) 14   Symptoms none   Duration Progress to 45 minutes of aerobic exercise without signs/symptoms of physical distress   Intensity THRR unchanged     Progression   Progression Continue to progress workloads to maintain intensity without signs/symptoms of physical distress.   Average METs 2.84     Resistance Training   Training Prescription Yes   Weight 3 lbs   Reps 10-15     Interval Training   Interval Training No     Treadmill   MPH 2.6   Grade 0.5   Minutes 15   METs 3.17     NuStep   Level 3   Minutes 15   METs 2.5      Nutrition:  Target Goals: Understanding of nutrition guidelines, daily intake of sodium <1561m, cholesterol <2028m calories 30% from fat and 7% or less from saturated fats, daily to have 5 or more servings of fruits and vegetables.  Biometrics:     Pre Biometrics - 02/27/16 1624      Pre Biometrics   Height 5' 3.2" (1.605 m)   Weight 154 lb 8 oz (70.1 kg)   Waist Circumference 33 inches   Hip Circumference 40 inches   Waist to Hip Ratio 0.82 %   BMI (Calculated) 27.3   Single Leg Stand 28.51 seconds       Nutrition Therapy Plan and Nutrition Goals:      Nutrition Therapy & Goals - 02/27/16 1332      Intervention Plan   Intervention Prescribe, educate and counsel regarding individualized specific dietary modifications aiming towards targeted core components such as weight, hypertension, lipid management, diabetes, heart failure and other comorbidities.   Expected Outcomes Short Term Goal: Understand basic principles of dietary content, such as calories, fat, sodium, cholesterol and nutrients.;Short Term Goal: A plan has been developed with personal nutrition goals set during dietitian appointment.;Long Term Goal: Adherence to prescribed nutrition plan.      Nutrition Discharge: Rate Your Plate Scores:     Nutrition Assessments - 02/27/16 1332      MEDFICTS Scores   Pre Score 28      Nutrition Goals Re-Evaluation:  Psychosocial: Target Goals: Acknowledge presence or absence of depression, maximize coping skills, provide positive support system. Participant is able to verbalize types and ability to use techniques and skills needed for reducing stress and depression.  Initial Review & Psychosocial Screening:     Initial Psych Review & Screening - 02/27/16 1344      Initial Review   Current issues with Current Sleep Concerns  unable to get to sleep.   Sleep pattern to bed at 2 am sleeps until 10 am      Quality of Life Scores:     Quality of Life - 02/27/16 1340      Quality of Life Scores   Health/Function Pre 19.33 %   Socioeconomic Pre 24 %   Psych/Spiritual Pre 30 %   Family Pre 28.8 %   GLOBAL Pre 24 %      PHQ-9: Recent Review Flowsheet Data    Depression screen [facility] 2/9 02/27/2016   Decreased Interest 2   Down, Depressed, Hopeless 0   PHQ - 2 Score 2   Altered sleeping 3   Tired, decreased energy 1   Change in appetite 2   Feeling bad or failure about yourself  1   Trouble concentrating 2   Moving slowly or fidgety/restless 0   Suicidal thoughts 0   PHQ-9 Score 11   Difficult doing work/chores  Somewhat difficult      Psychosocial Evaluation and Intervention:     Psychosocial Evaluation - 03/06/16 0937      Psychosocial Evaluation & Interventions   Interventions Encouraged to exercise with the program and follow exercise prescription;Relaxation education;Stress management education   Comments Counselor met with [name] today for initial psychosocial evaluation.  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  She has a strong support system with a spouse; adult son and active participation in her local church community.  [name] has multiple health issues including Lupus, neuropathy and knee problems in addition to her cardiac health concerns.  She reports having sleep problems both getting to sleep and even sleeping too much at times.  She states her appetite is good as well.  She admits to a history of anxiety and is on Effexor daily for this.  [name] states she is typically in a positive mood and other than her health she has minimal stress at this time.  Her goals for this program are to increase her stamina and strength and possibly lose some weight.  Staff will need to monitor [name]'s sleep patterns to see if they improve once she gets on a better schedule with consistent exercise and diet.  She may need further evaluation if this does not improve.       Continued Psychosocial Services Needed Yes  Follow up on sleep - to see if improves with exercise and improved schedule and diet.      Psychosocial Re-Evaluation:   Vocational Rehabilitation: Provide vocational rehab assistance to qualifying candidates.   Vocational Rehab Evaluation & Intervention:     Vocational Rehab - 02/27/16 1343      Initial Vocational Rehab Evaluation & Intervention   Assessment shows need for Vocational Rehabilitation No      Education: Education Goals: Education classes will be provided on a weekly basis, covering required topics. Participant will  state understanding/return demonstration of topics presented.  Learning Barriers/Preferences:     Learning Barriers/Preferences - 02/27/16 1342  Learning Barriers/Preferences   Learning Barriers --  Attention deficit   Learning Preferences Pictoral;Skilled Demonstration;Written Material;None      Education Topics: General Nutrition Guidelines/Fats and Fiber: -Group instruction provided by verbal, written material, models and posters to present the general guidelines for heart healthy nutrition. Gives an explanation and review of dietary fats and fiber.   Controlling Sodium/Reading Food Labels: -Group verbal and written material supporting the discussion of sodium use in heart healthy nutrition. Review and explanation with models, verbal and written materials for utilization of the food label.   Exercise Physiology & Risk Factors: - Group verbal and written instruction with models to review the exercise physiology of the cardiovascular system and associated critical values. Details cardiovascular disease risk factors and the goals associated with each risk factor. Flowsheet Row Cardiac Rehab from 03/13/2016 in [facility] Cardiac and Pulmonary Rehab  Date  03/06/16  Educator  LB  Instruction Review Code  2- meets goals/outcomes      Aerobic Exercise & Resistance Training: - Gives group verbal and written discussion on the health impact of inactivity. On the components of aerobic and resistive training programs and the benefits of this training and how to safely progress through these programs. Flowsheet Row Cardiac Rehab from 03/13/2016 in [facility] Cardiac and Pulmonary Rehab  Date  03/07/16  Educator  [facility]  Instruction Review Code  2- meets goals/outcomes      Flexibility, Balance, General Exercise Guidelines: - Provides group verbal and written instruction on the benefits of flexibility and balance training programs. Provides general exercise guidelines with specific guidelines to  those with heart or lung disease. Demonstration and skill practice provided. Flowsheet Row Cardiac Rehab from 03/13/2016 in [facility] Cardiac and Pulmonary Rehab  Date  03/13/16  Educator  [facility]  Instruction Review Code  2- meets goals/outcomes      Stress Management: - Provides group verbal and written instruction about the health risks of elevated stress, cause of high stress, and healthy ways to reduce stress.   Depression: - Provides group verbal and written instruction on the correlation between heart/lung disease and depressed mood, treatment options, and the stigmas associated with seeking treatment.   Anatomy & Physiology of the Heart: - Group verbal and written instruction and models provide basic cardiac anatomy and physiology, with the coronary electrical and arterial systems. Review of: AMI, Angina, Valve disease, Heart Failure, Cardiac Arrhythmia, Pacemakers, and the ICD.   Cardiac Procedures: - Group verbal and written instruction and models to describe the testing methods done to diagnose heart disease. Reviews the outcomes of the test results. Describes the treatment choices: Medical Management, Angioplasty, or Coronary Bypass Surgery.   Cardiac Medications: - Group verbal and written instruction to review commonly prescribed medications for heart disease. Reviews the medication, class of the drug, and side effects. Includes the steps to properly store meds and maintain the prescription regimen.   Go Sex-Intimacy & Heart Disease, Get SMART - Goal Setting: - Group verbal and written instruction through game format to discuss heart disease and the return to sexual intimacy. Provides group verbal and written material to discuss and apply goal setting through the application of the S.M.A.R.T. Method.   Other Matters of the Heart: - Provides group verbal, written materials and models to describe Heart Failure, Angina, Valve Disease, and Diabetes in the realm of heart disease.  Includes description of the disease process and treatment options available to the cardiac patient.   Exercise & Equipment Safety: - Individual verbal instruction and demonstration  of equipment use and safety with use of the equipment. Flowsheet Row Cardiac Rehab from 03/13/2016 in [facility] Cardiac and Pulmonary Rehab  Date  02/27/16  Educator  SB  Instruction Review Code  2- meets goals/outcomes      Infection Prevention: - Provides verbal and written material to individual with discussion of infection control including proper hand washing and proper equipment cleaning during exercise session. Flowsheet Row Cardiac Rehab from 03/13/2016 in [facility] Cardiac and Pulmonary Rehab  Date  02/27/16  Educator  SB  Instruction Review Code  2- meets goals/outcomes      Falls Prevention: - Provides verbal and written material to individual with discussion of falls prevention and safety. Flowsheet Row Cardiac Rehab from 03/13/2016 in [facility] Cardiac and Pulmonary Rehab  Date  02/27/16  Educator  Sb  Instruction Review Code  2- meets goals/outcomes      Diabetes: - Individual verbal and written instruction to review signs/symptoms of diabetes, desired ranges of glucose level fasting, after meals and with exercise. Advice that pre and post exercise glucose checks will be done for 3 sessions at entry of program.    Knowledge Questionnaire Score:     Knowledge Questionnaire Score - 02/27/16 1343      Knowledge Questionnaire Score   Pre Score 20/28  Correct responses reviewed with vebalization of understanding      Core Components/Risk Factors/Patient Goals at Admission:     Personal Goals and Risk Factors at Admission - 02/27/16 1332      Core Components/Risk Factors/Patient Goals on Admission    Weight Management Obesity;Weight Loss;Yes   Intervention Weight Management: Develop a combined nutrition and exercise program designed to reach desired caloric intake, while maintaining appropriate  intake of nutrient and fiber, sodium and fats, and appropriate energy expenditure required for the weight goal.;Weight Management: Provide education and appropriate resources to help participant work on and attain dietary goals.;Weight Management/Obesity: Establish reasonable short term and long term weight goals.;Obesity: Provide education and appropriate resources to help participant work on and attain dietary goals.   Admit Weight 154 lb 1.6 oz (69.9 kg)   Goal Weight: Short Term 150 lb (68 kg)   Goal Weight: Long Term 130 lb (59 kg)   Expected Outcomes Short Term: Continue to assess and modify interventions until short term weight is achieved;Long Term: Adherence to nutrition and physical activity/exercise program aimed toward attainment of established weight goal;Weight Loss: Understanding of general recommendations for a balanced deficit meal plan, which promotes 1-2 lb weight loss per week and includes a negative energy balance of [phone] kcal/d   Sedentary Yes   Intervention Provide advice, education, support and counseling about physical activity/exercise needs.;Develop an individualized exercise prescription for aerobic and resistive training based on initial evaluation findings, risk stratification, comorbidities and participant's personal goals.   Expected Outcomes Achievement of increased cardiorespiratory fitness and enhanced flexibility, muscular endurance and strength shown through measurements of functional capacity and personal statement of participant.   Increase Strength and Stamina Yes   Intervention Provide advice, education, support and counseling about physical activity/exercise needs.;Develop an individualized exercise prescription for aerobic and resistive training based on initial evaluation findings, risk stratification, comorbidities and participant's personal goals.   Expected Outcomes Achievement of increased cardiorespiratory fitness and enhanced flexibility, muscular  endurance and strength shown through measurements of functional capacity and personal statement of participant.   Hypertension Yes   Intervention Provide education on lifestyle modifcations including regular physical activity/exercise, weight management, moderate sodium restriction and increased consumption  of fresh fruit, vegetables, and low fat dairy, alcohol moderation, and smoking cessation.;Monitor prescription use compliance.   Expected Outcomes Short Term: Continued assessment and intervention until BP is < 140/63m HG in hypertensive participants. < 130/877mHG in hypertensive participants with diabetes, heart failure or chronic kidney disease.;Long Term: Maintenance of blood pressure at goal levels.   Lipids Yes   Intervention Provide education and support for participant on nutrition & aerobic/resistive exercise along with prescribed medications to achieve LDL <7031mHDL >78m89m Expected Outcomes Short Term: Participant states understanding of desired cholesterol values and is compliant with medications prescribed. Participant is following exercise prescription and nutrition guidelines.;Long Term: Cholesterol controlled with medications as prescribed, with individualized exercise RX and with personalized nutrition plan. Value goals: LDL < 70mg77mL > 40 mg.   Stress Yes  Current stress is being resolved soon.  Has non-family person living with her and it has become stressful   Intervention Offer individual and/or small group education and counseling on adjustment to heart disease, stress management and health-related lifestyle change. Teach and support self-help strategies.;Refer participants experiencing significant psychosocial distress to appropriate mental health specialists for further evaluation and treatment. When possible, include family members and significant others in education/counseling sessions.   Expected Outcomes Short Term: Participant demonstrates changes in health-related  behavior, relaxation and other stress management skills, ability to obtain effective social support, and compliance with psychotropic medications if prescribed.;Long Term: Emotional wellbeing is indicated by absence of clinically significant psychosocial distress or social isolation.      Core Components/Risk Factors/Patient Goals Review:    Core Components/Risk Factors/Patient Goals at Discharge (Final Review):    ITP Comments:     ITP Comments    Row Name 02/27/16 1328 03/14/16 0555 03/14/16 0611       ITP Comments Medical Review completed. Initial ITP created.  Diagnosis documentation can be found in C[facility] EVERYWHERE [facility] Continue with ITP unless directed changes per Medical Director review. 30 day review. Continue with ITP unless directed changes per Medical Director review.        Comments:

## 2016-03-14 NOTE — Progress Notes (Signed)
Daily Session Note  Patient Details  Name: [name] MRN: [id number] Date of Birth: Jul 29, 1950 Referring Provider:   Flowsheet Row Cardiac Rehab from 02/27/2016 in [facility] Cardiac and Pulmonary Rehab  Referring Provider  Bartholome Bill MD      Encounter Date: 03/14/2016  Check In:     Session Check In - 03/14/16 0852      Check-In   Location [facility]-Cardiac & Pulmonary Rehab   Staff Present Nyoka Cowden, RN, BSN, Willette Pa, MA, ACSM RCEP, Exercise Physiologist;Riniyah Speich Oletta Darter, [place], ACSM CEP, Exercise Physiologist   Supervising physician immediately available to respond to emergencies See telemetry face sheet for immediately available ER MD   Medication changes reported     No   Fall or balance concerns reported    No   Warm-up and Cool-down Performed on first and last piece of equipment   Resistance Training Performed Yes   VAD Patient? No     Pain Assessment   Currently in Pain? No/denies   Multiple Pain Sites No           Exercise Prescription Changes - 03/14/16 0800      Home Exercise Plan   Plans to continue exercise at [facility] (comment)  [facility]   Frequency Add 2 additional days to program exercise sessions.      Goals Met:  Independence with exercise equipment Exercise tolerated well No report of cardiac concerns or symptoms Strength training completed today  Goals Unmet:  Not Applicable  Comments: Reviewed home exercise with pt today.  Pt plans to walk  for exercise.  Reviewed THR, pulse, RPE, sign and symptoms, NTG use, and when to call 911 or MD.  Also discussed weather considerations and indoor options.  Pt voiced understanding.    Dr. Emily Filbert is Medical Director for [place] and [facility].

## 2016-03-15 ENCOUNTER — Encounter: Payer: Medicare Other | Attending: Cardiology | Admitting: *Deleted

## 2016-03-15 DIAGNOSIS — Z9861 Coronary angioplasty status: Secondary | ICD-10-CM | POA: Insufficient documentation

## 2016-03-15 DIAGNOSIS — I213 ST elevation (STEMI) myocardial infarction of unspecified site: Secondary | ICD-10-CM | POA: Diagnosis not present

## 2016-03-15 DIAGNOSIS — Z48812 Encounter for surgical aftercare following surgery on the circulatory system: Secondary | ICD-10-CM | POA: Insufficient documentation

## 2016-03-15 NOTE — Progress Notes (Signed)
Daily Session Note  Patient Details  Name: [name] MRN: [id number] Date of Birth: 1950/05/19 Referring Provider:   Flowsheet Row Cardiac Rehab from 02/27/2016 in [facility] Cardiac and Pulmonary Rehab  Referring Provider  Bartholome Bill MD      Encounter Date: 03/15/2016  Check In:     Session Check In - 03/15/16 1009      Check-In   Location [facility]-Cardiac & Pulmonary Rehab   Staff Present Alberteen Sam, MA, ACSM RCEP, Exercise Physiologist;Susanne Bice, RN, BSN, CCRP;Other;Amanda Oletta Darter, BA, ACSM CEP, Exercise Physiologist   Supervising physician immediately available to respond to emergencies See telemetry face sheet for immediately available ER MD   Medication changes reported     No   Fall or balance concerns reported    No   Warm-up and Cool-down Performed on first and last piece of equipment   Resistance Training Performed Yes   VAD Patient? No     Pain Assessment   Currently in Pain? No/denies   Multiple Pain Sites No         Goals Met:  Independence with exercise equipment Exercise tolerated well No report of cardiac concerns or symptoms Strength training completed today  Goals Unmet:  Not Applicable  Comments: Pt able to follow exercise prescription today without complaint.  Will continue to monitor for progression.    Dr. Emily Filbert is Medical Director for [place] and [facility].

## 2016-03-20 ENCOUNTER — Encounter: Payer: Medicare Other | Admitting: Respiratory Therapy

## 2016-03-20 DIAGNOSIS — I213 ST elevation (STEMI) myocardial infarction of unspecified site: Secondary | ICD-10-CM

## 2016-03-20 DIAGNOSIS — Z9861 Coronary angioplasty status: Secondary | ICD-10-CM

## 2016-03-20 DIAGNOSIS — Z48812 Encounter for surgical aftercare following surgery on the circulatory system: Secondary | ICD-10-CM | POA: Diagnosis not present

## 2016-03-20 NOTE — Progress Notes (Signed)
Daily Session Note  Patient Details  Name: [name] MRN: [id number] Date of Birth: 1950/08/23 Referring Provider:   Flowsheet Row Cardiac Rehab from 02/27/2016 in [facility] Cardiac and Pulmonary Rehab  Referring Provider  Bartholome Bill MD      Encounter Date: 03/20/2016  Check In:     Session Check In - 03/20/16 0842      Check-In   Location [facility]-Cardiac & Pulmonary Rehab   Staff Present Alberteen Sam, MA, ACSM RCEP, Exercise Physiologist;Laureen Owens Shark, BS, RRT, Respiratory Therapist;Jaykwon Morones, RN, BSN   Supervising physician immediately available to respond to emergencies See telemetry face sheet for immediately available ER MD   Medication changes reported     No   Fall or balance concerns reported    No   Warm-up and Cool-down Performed on first and last piece of equipment   Resistance Training Performed Yes   VAD Patient? No     Pain Assessment   Currently in Pain? No/denies   Multiple Pain Sites No         Goals Met:  Proper associated with RPD/PD & O2 Sat Changing diet to healthy choices, watching portion sizes Personal goals reviewed  Goals Unmet:  Not Applicable   Comments:     Dr. Emily Filbert is Medical Director for [place] and [facility].

## 2016-03-20 NOTE — Progress Notes (Signed)
Daily Session Note  Patient Details  Name: [name] MRN: [id number] Date of Birth: 1950/11/08 Referring Provider:   Flowsheet Row Cardiac Rehab from 02/27/2016 in [facility] Cardiac and Pulmonary Rehab  Referring Provider  Bartholome Bill MD      Encounter Date: 03/20/2016  Check In:     Session Check In - 03/20/16 0842      Check-In   Location [facility]-Cardiac & Pulmonary Rehab   Staff Present Alberteen Sam, MA, ACSM RCEP, Exercise Physiologist;Laureen Owens Shark, BS, RRT, Respiratory Therapist;Carroll Enterkin, RN, BSN   Supervising physician immediately available to respond to emergencies See telemetry face sheet for immediately available ER MD   Medication changes reported     No   Fall or balance concerns reported    No   Warm-up and Cool-down Performed on first and last piece of equipment   Resistance Training Performed Yes   VAD Patient? No     Pain Assessment   Currently in Pain? No/denies   Multiple Pain Sites No         Goals Met:  Proper associated with RPD/PD & O2 Sat Independence with exercise equipment Exercise tolerated well No report of cardiac concerns or symptoms Strength training completed today  Goals Unmet:  Not Applicable  Comments: Pt able to follow exercise prescription today without complaint.  Will continue to monitor for progression.   Dr. Emily Filbert is Medical Director for [place] and [facility].

## 2016-03-21 ENCOUNTER — Encounter: Payer: Self-pay | Admitting: *Deleted

## 2016-03-21 ENCOUNTER — Telehealth: Payer: Self-pay | Admitting: *Deleted

## 2016-03-21 DIAGNOSIS — I213 ST elevation (STEMI) myocardial infarction of unspecified site: Secondary | ICD-10-CM

## 2016-03-21 DIAGNOSIS — Z9861 Coronary angioplasty status: Secondary | ICD-10-CM

## 2016-03-21 NOTE — Telephone Encounter (Signed)
Called to check on pt.  She was told that she could hold her statin for a week to see if she has any pain relief.  She hopes to be here tomorrow.

## 2016-03-22 ENCOUNTER — Encounter: Payer: Self-pay | Admitting: Dietician

## 2016-03-22 DIAGNOSIS — Z9861 Coronary angioplasty status: Secondary | ICD-10-CM

## 2016-03-22 DIAGNOSIS — Z48812 Encounter for surgical aftercare following surgery on the circulatory system: Secondary | ICD-10-CM | POA: Diagnosis not present

## 2016-03-22 DIAGNOSIS — I213 ST elevation (STEMI) myocardial infarction of unspecified site: Secondary | ICD-10-CM

## 2016-03-22 NOTE — Progress Notes (Signed)
Daily Session Note  Patient Details  Name: [name] MRN: [id number] Date of Birth: 1950-06-15 Referring Provider:   Flowsheet Row Cardiac Rehab from 02/27/2016 in [facility] Cardiac and Pulmonary Rehab  Referring Provider  Bartholome Bill MD      Encounter Date: 03/22/2016  Check In:     Session Check In - 03/22/16 0835      Check-In   Location [facility]-Cardiac & Pulmonary Rehab   Staff Present Alberteen Sam, MA, ACSM RCEP, Exercise Physiologist;Patricia Surles RN Vickki Hearing, BA, ACSM CEP, Exercise Physiologist   Supervising physician immediately available to respond to emergencies See telemetry face sheet for immediately available ER MD   Medication changes reported     No   Fall or balance concerns reported    No   Warm-up and Cool-down Performed on first and last piece of equipment   Resistance Training Performed Yes   VAD Patient? No     Pain Assessment   Currently in Pain? No/denies   Multiple Pain Sites No           Exercise Prescription Changes - 03/21/16 1400      Exercise Review   Progression Yes     Response to Exercise   Blood Pressure (Admit) 112/60   Blood Pressure (Exercise) 142/78   Blood Pressure (Exit) 118/72   Heart Rate (Admit) 71 bpm   Heart Rate (Exercise) 117 bpm   Heart Rate (Exit) 64 bpm   Rating of Perceived Exertion (Exercise) 13   Symptoms none   Comments Home Exercise Guidelines given 03/14/16   Duration Progress to 45 minutes of aerobic exercise without signs/symptoms of physical distress   Intensity THRR unchanged     Progression   Progression Continue to progress workloads to maintain intensity without signs/symptoms of physical distress.   Average METs 3.08     Resistance Training   Training Prescription Yes   Weight 3 lbs   Reps 10-15     Interval Training   Interval Training No     Treadmill   MPH 2.6   Grade 1   Minutes 15   METs 3.35     NuStep   Level 4   Minutes 15   METs 3.5     Recumbant Elliptical   Level 2   RPM 50   Minutes 15   METs 2.4     Home Exercise Plan   Plans to continue exercise at [employer] (comment)  [facility]   Frequency Add 2 additional days to program exercise sessions.      Goals Met:  Independence with exercise equipment Exercise tolerated well No report of cardiac concerns or symptoms Strength training completed today  Goals Unmet:  Not Applicable  Comments: Pt able to follow exercise prescription today without complaint.  Will continue to monitor for progression.    Dr. Emily Filbert is Medical Director for [place] and [facility].

## 2016-03-27 ENCOUNTER — Encounter: Payer: Medicare Other | Admitting: Respiratory Therapy

## 2016-03-27 DIAGNOSIS — Z48812 Encounter for surgical aftercare following surgery on the circulatory system: Secondary | ICD-10-CM | POA: Diagnosis not present

## 2016-03-27 DIAGNOSIS — Z9861 Coronary angioplasty status: Secondary | ICD-10-CM

## 2016-03-27 DIAGNOSIS — I213 ST elevation (STEMI) myocardial infarction of unspecified site: Secondary | ICD-10-CM

## 2016-03-27 NOTE — Progress Notes (Signed)
Daily Session Note  Patient Details  Name: [name] MRN: [id number] Date of Birth: [birth year] Referring Provider:   Flowsheet Row Cardiac Rehab from 02/27/2016 in [facility] Cardiac and Pulmonary Rehab  Referring Provider  Bartholome Bill MD      Encounter Date: 03/27/2016  Check In:     Session Check In - 03/27/16 0842      Check-In   Location [facility]-Cardiac & Pulmonary Rehab   Staff Present Alberteen Sam, MA, ACSM RCEP, Exercise Physiologist;Laureen Owens Shark, BS, RRT, Respiratory Therapist;Carroll Enterkin, RN, BSN   Supervising physician immediately available to respond to emergencies See telemetry face sheet for immediately available ER MD   Medication changes reported     No   Fall or balance concerns reported    No   Warm-up and Cool-down Performed on first and last piece of equipment   Resistance Training Performed Yes   VAD Patient? No     Pain Assessment   Currently in Pain? No/denies   Multiple Pain Sites No         Goals Met:  Proper associated with RPD/PD & O2 Sat Independence with exercise equipment Exercise tolerated well No report of cardiac concerns or symptoms Strength training completed today  Goals Unmet:  Not Applicable  Comments: Pt able to follow exercise prescription today without complaint.  Will continue to monitor for progression.   Dr. Emily Filbert is Medical Director for [place] and [facility].

## 2016-03-28 ENCOUNTER — Encounter: Payer: Medicare Other | Admitting: *Deleted

## 2016-03-28 DIAGNOSIS — I213 ST elevation (STEMI) myocardial infarction of unspecified site: Secondary | ICD-10-CM

## 2016-03-28 DIAGNOSIS — Z48812 Encounter for surgical aftercare following surgery on the circulatory system: Secondary | ICD-10-CM | POA: Diagnosis not present

## 2016-03-28 DIAGNOSIS — Z9861 Coronary angioplasty status: Secondary | ICD-10-CM

## 2016-03-28 NOTE — Progress Notes (Signed)
Daily Session Note  Patient Details  Name: [name] MRN: [id number] Date of Birth: [birth year] Referring Provider:   Flowsheet Row Cardiac Rehab from 02/27/2016 in [facility] Cardiac and Pulmonary Rehab  Referring Provider  Bartholome Bill MD      Encounter Date: 03/28/2016  Check In:     Session Check In - 03/28/16 0927      Check-In   Location [facility]-Cardiac & Pulmonary Rehab   Staff Present Alberteen Sam, MA, ACSM RCEP, Exercise Physiologist;Susanne Bice, RN, BSN, Lance Sell, BA, ACSM CEP, Exercise Physiologist   Supervising physician immediately available to respond to emergencies See telemetry face sheet for immediately available ER MD   Medication changes reported     No   Fall or balance concerns reported    No   Warm-up and Cool-down Performed on first and last piece of equipment   Resistance Training Performed Yes   VAD Patient? No     Pain Assessment   Currently in Pain? No/denies   Multiple Pain Sites No         Goals Met:  Independence with exercise equipment Exercise tolerated well No report of cardiac concerns or symptoms Strength training completed today  Goals Unmet:  Not Applicable  Comments: Pt able to follow exercise prescription today without complaint.  Will continue to monitor for progression.    Dr. Emily Filbert is Medical Director for [place] and [facility].

## 2016-03-29 DIAGNOSIS — I213 ST elevation (STEMI) myocardial infarction of unspecified site: Secondary | ICD-10-CM

## 2016-03-29 DIAGNOSIS — Z9861 Coronary angioplasty status: Secondary | ICD-10-CM

## 2016-03-29 DIAGNOSIS — Z48812 Encounter for surgical aftercare following surgery on the circulatory system: Secondary | ICD-10-CM | POA: Diagnosis not present

## 2016-03-29 NOTE — Progress Notes (Signed)
Daily Session Note  Patient Details  Name: [name] MRN: [id number] Date of Birth: Jul 02, 1950 Referring Provider:   Flowsheet Row Cardiac Rehab from 02/27/2016 in [facility] Cardiac and Pulmonary Rehab  Referring Provider  Bartholome Bill MD      Encounter Date: 03/29/2016  Check In:     Session Check In - 03/29/16 0900      Check-In   Location [facility]-Cardiac & Pulmonary Rehab   Staff Present Alberteen Sam, MA, ACSM RCEP, Exercise Physiologist;Patricia Surles RN Vickki Hearing, BA, ACSM CEP, Exercise Physiologist   Supervising physician immediately available to respond to emergencies See telemetry face sheet for immediately available ER MD   Medication changes reported     Yes   Comments Decreased metoprolol   Fall or balance concerns reported    No   Warm-up and Cool-down Performed on first and last piece of equipment   Resistance Training Performed Yes   VAD Patient? No     Pain Assessment   Currently in Pain? No/denies   Multiple Pain Sites No         Goals Met:  Independence with exercise equipment Exercise tolerated well No report of cardiac concerns or symptoms Strength training completed today  Goals Unmet:  Not Applicable  Comments: Pt able to follow exercise prescription today without complaint.  Will continue to monitor for progression.    Dr. Emily Filbert is Medical Director for [place] and [facility].

## 2016-04-03 ENCOUNTER — Encounter: Payer: Medicare Other | Admitting: Respiratory Therapy

## 2016-04-03 DIAGNOSIS — I213 ST elevation (STEMI) myocardial infarction of unspecified site: Secondary | ICD-10-CM

## 2016-04-03 DIAGNOSIS — Z48812 Encounter for surgical aftercare following surgery on the circulatory system: Secondary | ICD-10-CM | POA: Diagnosis not present

## 2016-04-03 DIAGNOSIS — Z9861 Coronary angioplasty status: Secondary | ICD-10-CM

## 2016-04-03 NOTE — Progress Notes (Signed)
Daily Session Note  Patient Details  Name: [name] MRN: [id number] Date of Birth: August 19, 1950 Referring Provider:   Flowsheet Row Cardiac Rehab from 02/27/2016 in [facility] Cardiac and Pulmonary Rehab  Referring Provider  Bartholome Bill MD      Encounter Date: 04/03/2016  Check In:     Session Check In - 04/03/16 0853      Check-In   Staff Present Heath Lark, RN, BSN, CCRP;Laureen Owens Shark, BS, RRT, Respiratory Therapist;Carroll Enterkin, RN, BSN   Supervising physician immediately available to respond to emergencies See telemetry face sheet for immediately available ER MD   Medication changes reported     No   Fall or balance concerns reported    No   Warm-up and Cool-down Performed on first and last piece of equipment   Resistance Training Performed Yes   VAD Patient? No     Pain Assessment   Currently in Pain? No/denies   Multiple Pain Sites No         Goals Met:  Exercise tolerated well No report of cardiac concerns or symptoms Strength training completed today  Goals Unmet:  Not Applicable  Comments: Doing well with exercise prescription progression.    Dr. Emily Filbert is Medical Director for [place] and [facility].

## 2016-04-04 ENCOUNTER — Encounter: Payer: Medicare Other | Admitting: *Deleted

## 2016-04-04 DIAGNOSIS — Z9861 Coronary angioplasty status: Secondary | ICD-10-CM

## 2016-04-04 DIAGNOSIS — I213 ST elevation (STEMI) myocardial infarction of unspecified site: Secondary | ICD-10-CM

## 2016-04-04 DIAGNOSIS — Z48812 Encounter for surgical aftercare following surgery on the circulatory system: Secondary | ICD-10-CM | POA: Diagnosis not present

## 2016-04-04 NOTE — Progress Notes (Signed)
Daily Session Note  Patient Details  Name: [name] MRN: [id number] Date of Birth: 1950-07-27 Referring Provider:   Flowsheet Row Cardiac Rehab from 02/27/2016 in [facility] Cardiac and Pulmonary Rehab  Referring Provider  Bartholome Bill MD      Encounter Date: 04/04/2016  Check In:     Session Check In - 04/04/16 0946      Check-In   Location [facility]-Cardiac & Pulmonary Rehab   Staff Present Alberteen Sam, MA, ACSM RCEP, Exercise Physiologist;Carroll Enterkin, RN, Vickki Hearing, BA, ACSM CEP, Exercise Physiologist   Supervising physician immediately available to respond to emergencies See telemetry face sheet for immediately available ER MD   Medication changes reported     No   Fall or balance concerns reported    No   Warm-up and Cool-down Performed on first and last piece of equipment   Resistance Training Performed Yes   VAD Patient? No     Pain Assessment   Currently in Pain? No/denies   Multiple Pain Sites No           Exercise Prescription Changes - 04/03/16 1600      Exercise Review   Progression Yes     Response to Exercise   Blood Pressure (Admit) 108/62   Blood Pressure (Exercise) 126/62   Blood Pressure (Exit) 132/60   Heart Rate (Admit) 73 bpm   Heart Rate (Exercise) 113 bpm   Heart Rate (Exit) 80 bpm   Rating of Perceived Exertion (Exercise) 13   Symptoms none   Comments Home Exercise Guidelines given 03/14/16   Duration Progress to 45 minutes of aerobic exercise without signs/symptoms of physical distress   Intensity THRR unchanged     Progression   Progression Continue to progress workloads to maintain intensity without signs/symptoms of physical distress.   Average METs 3.38     Resistance Training   Training Prescription Yes   Weight 3 lbs   Reps 10-15     Interval Training   Interval Training No     Treadmill   MPH 2.6   Grade 1   Minutes 15   METs 3.35     NuStep   Level 4   Minutes 15   METs 4     Recumbant Elliptical   Level 2   Minutes 15   METs 2.8     Home Exercise Plan   Plans to continue exercise at [employer] (comment)  [facility]   Frequency Add 2 additional days to program exercise sessions.      Goals Met:  Proper associated with RPD/PD & O2 Sat Exercise tolerated well No report of cardiac concerns or symptoms Strength training completed today  Goals Unmet:  Not Applicable  Comments: Pt able to follow exercise prescription today without complaint.  Will continue to monitor for progression.    Dr. Emily Filbert is Medical Director for [place] and [facility].

## 2016-04-05 ENCOUNTER — Encounter: Payer: Medicare Other | Admitting: *Deleted

## 2016-04-05 DIAGNOSIS — Z9861 Coronary angioplasty status: Secondary | ICD-10-CM

## 2016-04-05 DIAGNOSIS — Z48812 Encounter for surgical aftercare following surgery on the circulatory system: Secondary | ICD-10-CM | POA: Diagnosis not present

## 2016-04-05 DIAGNOSIS — I213 ST elevation (STEMI) myocardial infarction of unspecified site: Secondary | ICD-10-CM

## 2016-04-05 NOTE — Progress Notes (Signed)
Daily Session Note  Patient Details  Name: [name] MRN: [id number] Date of Birth: 09/18/1950 Referring Provider:   Flowsheet Row Cardiac Rehab from 02/27/2016 in [facility] Cardiac and Pulmonary Rehab  Referring Provider  Bartholome Bill MD      Encounter Date: 04/05/2016  Check In:     Session Check In - 04/05/16 1048      Check-In   Location [facility]-Cardiac & Pulmonary Rehab   Staff Present Alberteen Sam, MA, ACSM RCEP, Exercise Physiologist;Other;Carroll Enterkin, RN, BSN  Darel Hong, RN   Supervising physician immediately available to respond to emergencies See telemetry face sheet for immediately available ER MD   Medication changes reported     No   Fall or balance concerns reported    No   Warm-up and Cool-down Performed on first and last piece of equipment   Resistance Training Performed Yes   VAD Patient? No     Pain Assessment   Currently in Pain? No/denies   Multiple Pain Sites No         Goals Met:  Independence with exercise equipment Exercise tolerated well No report of cardiac concerns or symptoms Strength training completed today  Goals Unmet:  Not Applicable  Comments: Pt able to follow exercise prescription today without complaint.  Will continue to monitor for progression.    Dr. Emily Filbert is Medical Director for [place] and [facility].

## 2016-04-10 ENCOUNTER — Encounter: Payer: Medicare Other | Admitting: Respiratory Therapy

## 2016-04-10 DIAGNOSIS — Z9861 Coronary angioplasty status: Secondary | ICD-10-CM

## 2016-04-10 DIAGNOSIS — I213 ST elevation (STEMI) myocardial infarction of unspecified site: Secondary | ICD-10-CM

## 2016-04-10 DIAGNOSIS — Z48812 Encounter for surgical aftercare following surgery on the circulatory system: Secondary | ICD-10-CM | POA: Diagnosis not present

## 2016-04-10 NOTE — Progress Notes (Signed)
Daily Session Note  Patient Details  Name: [name] MRN: [id number] Date of Birth: 1951-01-27 Referring Provider:   Flowsheet Row Cardiac Rehab from 02/27/2016 in [facility] Cardiac and Pulmonary Rehab  Referring Provider  Bartholome Bill MD      Encounter Date: 04/10/2016  Check In:     Session Check In - 04/10/16 0844      Check-In   Location [facility]-Cardiac & Pulmonary Rehab   Staff Present Alberteen Sam, MA, ACSM RCEP, Exercise Physiologist;Susanne Bice, RN, BSN, CCRP;Laureen Owens Shark, BS, RRT, Respiratory Therapist   Supervising physician immediately available to respond to emergencies See telemetry face sheet for immediately available ER MD   Medication changes reported     No   Fall or balance concerns reported    No   Warm-up and Cool-down Performed on first and last piece of equipment   Resistance Training Performed Yes   VAD Patient? No     Pain Assessment   Currently in Pain? No/denies   Multiple Pain Sites No         History  Smoking Status   Former Smoker   Quit date: 02/13/1983  Smokeless Tobacco   Not on file    Goals Met:  Proper associated with RPD/PD & O2 Sat Independence with exercise equipment Exercise tolerated well No report of cardiac concerns or symptoms Strength training completed today  Goals Unmet:  Not Applicable  Comments: Pt able to follow exercise prescription today without complaint.  Will continue to monitor for progression.   Dr. Emily Filbert is Medical Director for [place] and [facility].

## 2016-04-11 ENCOUNTER — Encounter: Payer: Medicare Other | Admitting: *Deleted

## 2016-04-11 ENCOUNTER — Encounter: Payer: Self-pay | Admitting: *Deleted

## 2016-04-11 DIAGNOSIS — I213 ST elevation (STEMI) myocardial infarction of unspecified site: Secondary | ICD-10-CM

## 2016-04-11 DIAGNOSIS — Z9861 Coronary angioplasty status: Secondary | ICD-10-CM

## 2016-04-11 NOTE — Progress Notes (Signed)
Daily Session Note  Patient Details  Name: [name] MRN: [id number] Date of Birth: 1950-12-06 Referring Provider:   Flowsheet Row Cardiac Rehab from 02/27/2016 in [facility] Cardiac and Pulmonary Rehab  Referring Provider  Bartholome Bill MD      Encounter Date: 04/11/2016  Check In:     Session Check In - 04/11/16 0847      Check-In   Location [facility]-Cardiac & Pulmonary Rehab   Staff Present Alberteen Sam, MA, ACSM RCEP, Exercise Physiologist;Susanne Bice, RN, BSN, Lance Sell, BA, ACSM CEP, Exercise Physiologist   Supervising physician immediately available to respond to emergencies See telemetry face sheet for immediately available ER MD   Medication changes reported     No   Fall or balance concerns reported    No   Warm-up and Cool-down Performed on first and last piece of equipment   Resistance Training Performed Yes   VAD Patient? No     Pain Assessment   Currently in Pain? No/denies   Multiple Pain Sites No         History  Smoking Status  . Former Smoker  . Quit date: 02/13/1983  Smokeless Tobacco  . Not on file    Goals Met:  Independence with exercise equipment Exercise tolerated well No report of cardiac concerns or symptoms Strength training completed today  Goals Unmet:  Not Applicable  Comments: Pt able to follow exercise prescription today without complaint.  Will continue to monitor for progression.    Dr. Emily Filbert is Medical Director for [place] and [facility].

## 2016-04-11 NOTE — Progress Notes (Signed)
Cardiac Individual Treatment Plan  Patient Details  Name: [name] MRN: [id number] Date of Birth: 07-15-1950 Referring Provider:   Flowsheet Row Cardiac Rehab [facility]/27/[facility] Cardiac and Pulmonary Rehab  Referring Provider  Bartholome Bill MD      Initial Encounter Date:  Flowsheet Row Cardiac Rehab from 02/27/2016 in [facility] Cardiac and Pulmonary Rehab  Date  02/27/16  Referring Provider  Bartholome Bill MD      Visit Diagnosis: ST elevation myocardial infarction (STEMI), unspecified artery ([place])  S/P PTCA (percutaneous transluminal coronary angioplasty)  Patient's Home Medications on Admission:  Current Outpatient Prescriptions:  .  amlodipine-benazepril (LOTREL) 2.5-10 MG per capsule, Take 1 capsule by mouth daily., Disp: , Rfl:  .  clopidogrel (PLAVIX) 75 MG tablet, Take 75 mg by mouth daily., Disp: , Rfl:  .  cyclobenzaprine (FLEXERIL) 10 MG tablet, Take 1 tablet (10 mg total) by mouth 3 (three) times daily as needed for muscle spasms., Disp: 30 tablet, Rfl: 0 .  EPINEPHrine 1 MG/ML SOLN, Inject as directed., Disp: , Rfl:  .  estradiol (ESTRACE) 0.1 MG/GM vaginal cream, Place 1 Applicatorful vaginally 3 (three) times a week., Disp: , Rfl:  .  hydroxychloroquine (PLAQUENIL) 200 MG tablet, Take by mouth daily., Disp: , Rfl:  .  METOPROLOL TARTRATE PO, Take 25 mg by mouth., Disp: , Rfl:  .  oxybutynin (DITROPAN) 5 MG tablet, Take 5 mg by mouth 3 (three) times daily., Disp: , Rfl:  .  traMADol (ULTRAM) 50 MG tablet, Take 1 tablet (50 mg total) by mouth every 6 (six) hours as needed., Disp: 9 tablet, Rfl: 0 .  venlafaxine (EFFEXOR) 75 MG tablet, Take 75 mg by mouth 2 (two) times daily., Disp: , Rfl:   Past Medical History: Past Medical History:  Diagnosis Date  . Anxiety   . Cystocele   . Hypertension   . PMB (postmenopausal bleeding)   . Rectocele   . SUI (stress urinary incontinence, female)   . Unstable bladder   . Urination frequency   . 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 Vaginal atrophy   . Vaginal itching     Tobacco Use: History  Smoking Status  . Former Smoker  . Quit date: 02/13/1983  Smokeless Tobacco  . Not on file    Labs: Recent Review Flowsheet Data    There is no flowsheet data to display.       Exercise Target Goals:    Exercise Program Goal: Individual exercise prescription set with THRR, safety & activity barriers. Participant demonstrates ability to understand and report RPE using BORG scale, to self-measure pulse accurately, and to acknowledge the importance of the exercise prescription.  Exercise Prescription Goal: Starting with aerobic activity 30 plus minutes a day, 3 days per week for initial exercise prescription. Provide home exercise prescription and guidelines that participant acknowledges understanding prior to discharge.  Activity Barriers & Risk Stratification:     Activity Barriers & Cardiac Risk Stratification - 02/27/16 1342      Activity Barriers & Cardiac Risk Stratification   Activity Barriers None      6 Minute Walk:     6 Minute Walk    Row Name 02/27/16 1623         6 Minute Walk   Phase Initial     Distance 1530 feet     Walk Time 6 minutes     # of Rest Breaks 0     MPH 2.9  METS 3.67     RPE 10     VO2 Peak 12.8     Symptoms No     Resting HR 7 bpm     Resting BP 126/54     Max Ex. HR 113 bpm     Max Ex. BP 134/70     2 Minute Post BP 126/64        Oxygen Initial Assessment:   Oxygen Re-Evaluation:   Oxygen Discharge (Final Oxygen Re-Evaluation):   Initial Exercise Prescription:     Initial Exercise Prescription - 02/27/16 1600      Date of Initial Exercise RX and Referring Provider   Date 02/27/16   Referring Provider Bartholome Bill MD     Treadmill   MPH 2.6   Grade 0.5   Minutes 15   METs 3.17     NuStep   Level 3   Minutes 15   METs 2     Recumbant Elliptical   Level 2   RPM 50   Minutes 15   METs 2     Prescription  Details   Frequency (times per week) 3   Duration Progress to 45 minutes of aerobic exercise without signs/symptoms of physical distress     Intensity   THRR 40-80% of Max Heartrate 100-137   Ratings of Perceived Exertion 11-15   Perceived Dyspnea 0-4     Progression   Progression Continue to progress workloads to maintain intensity without signs/symptoms of physical distress.     Resistance Training   Training Prescription Yes   Weight 3 lbs   Reps 10-15      Perform Capillary Blood Glucose checks as needed.  Exercise Prescription Changes:     Exercise Prescription Changes    Row Name 02/27/16 1500 03/06/16 1300 03/14/16 0800 03/21/16 1400 04/03/16 1600     Response to Exercise   Blood Pressure (Admit) 126/54 124/70  - 112/60 108/62   Blood Pressure (Exercise) 134/70 126/70  - 142/78 126/62   Blood Pressure (Exit) 126/64 120/60  - 118/72 132/60   Heart Rate (Admit) 64 bpm 83 bpm  - 71 bpm 73 bpm   Heart Rate (Exercise) 113 bpm 127 bpm  - 117 bpm 113 bpm   Heart Rate (Exit) 69 bpm 70 bpm  - 64 bpm 80 bpm   Oxygen Saturation (Admit) 98 %  -  -  -  -   Oxygen Saturation (Exercise) 100 %  -  -  -  -   Rating of Perceived Exertion (Exercise) 10 14  - 13 13   Symptoms  - none  - none none   Comments  -  -  - Home Exercise Guidelines given 03/14/16 Home Exercise Guidelines given 03/14/16   Duration  - Progress to 45 minutes of aerobic exercise without signs/symptoms of physical distress  - Progress to 45 minutes of aerobic exercise without signs/symptoms of physical distress Progress to 45 minutes of aerobic exercise without signs/symptoms of physical distress   Intensity  - THRR unchanged  - THRR unchanged THRR unchanged     Progression   Progression  - Continue to progress workloads to maintain intensity without signs/symptoms of physical distress.  - Continue to progress workloads to maintain intensity without signs/symptoms of physical distress. Continue to progress workloads  to maintain intensity without signs/symptoms of physical distress.   Average METs  - 2.84  - 3.08 3.38     Resistance Training   Training Prescription  - Yes  -  Yes Yes   Weight  - 3 lbs  - 3 lbs 3 lbs   Reps  - 10-15  - 10-15 10-15     Interval Training   Interval Training  - No  - No No     Treadmill   MPH  - 2.6  - 2.6 2.6   Grade  - 0.5  - 1 1   Minutes  - 15  - 15 15   METs  - 3.17  - 3.35 3.35     NuStep   Level  - 3  - 4 4   Minutes  - 15  - 15 15   METs  - 2.5  - 3.5 4     Recumbant Elliptical   Level  -  -  - 2 2   RPM  -  -  - 50  -   Minutes  -  -  - 15 15   METs  -  -  - 2.4 2.8     Home Exercise Plan   Plans to continue exercise at  -  - [employer] [facility] [employer] (comment)  [place] (comment)  [place] (comment)  [facility]   Frequency  -  - Add 2 additional days to program exercise sessions. Add 2 additional days to program exercise sessions. Add 2 additional days to program exercise sessions.     Exercise Review   Progression -  walk test results -  First Full Day of Exercise  - Yes Yes      Exercise Comments:     Exercise Comments    Row Name 03/06/16 1029 03/14/16 0854 03/21/16 1413 04/03/16 1606     Exercise Comments  First full day of exercise!  Patient was oriented to gym and equipment including functions, settings, policies, and procedures.  Patient's individual exercise prescription and treatment plan were reviewed.  All starting workloads were established based on the results of the 6 minute walk test done at initial orientation visit.  The plan for exercise progression was also introduced and progression will be customized based on patient's performance and goals. Erricka plans to walk and do yoga at a community center.  She is also seeing PT for hip/knee pain. Danuta continues to have pain in her leg and hip.  She has been doing well with rehab though.  Today  she did not stay for exercise but hopes to return tomorrow. Dwanda has been doing well in rehab.  She is up to 4.0 METs on the NuStep.  We will continue to monitor her progression.       Exercise Goals and Review:   Exercise Goals Re-Evaluation :   Discharge Exercise Prescription (Final Exercise Prescription Changes):     Exercise Prescription Changes - 04/03/16 1600      Response to Exercise   Blood Pressure (Admit) 108/62   Blood Pressure (Exercise) 126/62   Blood Pressure (Exit) 132/60   Heart Rate (Admit) 73 bpm   Heart Rate (Exercise) 113 bpm   Heart Rate (Exit) 80 bpm   Rating of Perceived Exertion (Exercise) 13   Symptoms none   Comments Home Exercise Guidelines given 03/14/16   Duration Progress to 45 minutes of aerobic exercise without signs/symptoms of physical distress   Intensity THRR unchanged     Progression   Progression Continue to progress workloads to maintain intensity without signs/symptoms of physical distress.   Average METs 3.38  Resistance Training   Training Prescription Yes   Weight 3 lbs   Reps 10-15     Interval Training   Interval Training No     Treadmill   MPH 2.6   Grade 1   Minutes 15   METs 3.35     NuStep   Level 4   Minutes 15   METs 4     Recumbant Elliptical   Level 2   Minutes 15   METs 2.8     Home Exercise Plan   Plans to continue exercise at [employer] (comment)  [facility]   Frequency Add 2 additional days to program exercise sessions.     Exercise Review   Progression Yes      Nutrition:  Target Goals: Understanding of nutrition guidelines, daily intake of sodium <1567m, cholesterol <2070m calories 30% from fat and 7% or less from saturated fats, daily to have 5 or more servings of fruits and vegetables.  Biometrics:     Pre Biometrics - 02/27/16 1624      Pre Biometrics   Height 5' 3.2" (1.605 m)   Weight 154 lb 8 oz (70.1 kg)   Waist Circumference 33 inches   Hip  Circumference 40 inches   Waist to Hip Ratio 0.82 %   BMI (Calculated) 27.3   Single Leg Stand 28.51 seconds       Nutrition Therapy Plan and Nutrition Goals:     Nutrition Therapy & Goals - 03/22/16 1221      Nutrition Therapy   Diet DASH diet principles   Protein (specify units) 6oz   Fiber 25 grams   Whole Grain Foods 3 servings   Saturated Fats 12 max. grams   Fruits and Vegetables 8 servings/day   Sodium 1500 grams     Personal Nutrition Goals   Nutrition Goal decrease butter -- try Smart Balance spread   Personal Goal #2 Be aware of salt content in foods (advised goal of 50078mverage per meal)   Personal Goal #3 control frequency of shrimp intake   Personal Goal #4 use plate method to control portions of starches and meats, and have generous veg and fruit portions   Comments Ms. Jelinek reports steak as a favorite food, as well as cheese and shrimp. She will work to improve food choices and food portions to improve heart health and reduce weight.     Intervention Plan   Intervention Prescribe, educate and counsel regarding individualized specific dietary modifications aiming towards targeted core components such as weight, hypertension, lipid management, diabetes, heart failure and other comorbidities.;Nutrition handout(s) given to patient.   Expected Outcomes Short Term Goal: Understand basic principles of dietary content, such as calories, fat, sodium, cholesterol and nutrients.;Short Term Goal: A plan has been developed with personal nutrition goals set during dietitian appointment.;Long Term Goal: Adherence to prescribed nutrition plan.      Nutrition Discharge: Rate Your Plate Scores:     Nutrition Assessments - 04/10/16 1536      Rate Your Plate Scores   Post Score 78   Post Score % 86.7 %     MEDFICTS Scores   Pre Score 28   Post Score 24   Score Difference -4      Nutrition Goals Re-Evaluation:     Nutrition Goals Re-Evaluation    Row Name  03/20/16 0942             Goals   Comment [place] [employer] [place] she said. I also made her an appt with the Cardiac Rehab Registered dietician.         Personal Goal #1 Re-Evaluation   Goal Progress Seen Yes          Nutrition Goals Discharge (Final Nutrition Goals Re-Evaluation):     Nutrition Goals Re-Evaluation - 03/20/16 0942      Goals   Comment Aalivia [place] [employer] [facility] [place] she said. I also made her an appt with the Cardiac Rehab Registered dietician.     Personal Goal #1 Re-Evaluation   Goal Progress Seen Yes      Psychosocial: Target Goals: Acknowledge presence or absence of significant depression and/or stress, maximize coping skills, provide positive support system. Participant is able to verbalize types and ability to use techniques and skills needed for reducing stress and depression.   Initial Review & Psychosocial Screening:     Initial Psych Review & Screening - 02/27/16 1344      Initial Review   Current issues with Current Sleep Concerns  unable to get to sleep.   Sleep pattern to bed at 2 am sleeps until 10 am      Quality of Life Scores:      Quality of Life - 04/10/16 1538      Quality of Life Scores   Health/Function Pre 19.33 %   Health/Function Post 21.77 %   Health/Function % Change 12.62 %   Socioeconomic Pre 24 %   Socioeconomic Post 24 %   Socioeconomic % Change  0 %   Psych/Spiritual Pre 30 %   Psych/Spiritual Post 22.57 %   Psych/Spiritual % Change -24.77 %   Family Pre 28.8 %   Family Post 24 %   Family % Change -16.67 %   GLOBAL Pre 24 %   GLOBAL Post 22.72 %   GLOBAL % Change -5.33 %      PHQ-9: Recent Review Flowsheet Data    Depression screen [facility] 2/9 04/10/2016 02/27/2016   Decreased Interest 1 2   Down, Depressed, Hopeless 1 0   PHQ - 2 Score 2 2   Altered sleeping 1 3   Tired, decreased energy 1 1   Change in appetite 0 2   Feeling bad or failure about yourself  0 1   Trouble  concentrating 0 2   Moving slowly or fidgety/restless 0 0   Suicidal thoughts 0 0   PHQ-9 Score 4 11   Difficult doing work/chores Not difficult at all Somewhat difficult     Interpretation of Total Score  Total Score Depression Severity:  1-4 = Minimal depression, 5-9 = Mild depression, 10-14 = Moderate depression, 15-19 = Moderately severe depression, 20-27 = Severe depression   Psychosocial Evaluation and Intervention:     Psychosocial Evaluation - 03/06/16 0937      Psychosocial Evaluation & Interventions   Interventions Encouraged to exercise with the program and follow exercise prescription;Relaxation education;Stress management education   Comments Counselor met with Ms. C today for initial psychosocial evaluation.  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  She has a strong support system with a spouse; adult son and active participation in her local church community.  Ms. C has multiple health issues including Lupus, neuropathy and knee problems in addition to her cardiac health concerns.  She reports having sleep problems both getting to sleep and even sleeping too much at times.  She  states her appetite is good as well.  She admits to a history of anxiety and is on Effexor daily for this.  Ms. C states she is typically in a positive mood and other than her health she has minimal stress at this time.  Her goals for this program are to increase her stamina and strength and possibly lose some weight.  Staff will need to monitor Ms. C's sleep patterns to see if they improve once she gets on a better schedule with consistent exercise and diet.  She may need further evaluation if this does not improve.       Continue Psychosocial Services  Yes  Follow up on sleep - to see if improves with exercise and improved schedule and diet.      Psychosocial Re-Evaluation:     Psychosocial Re-Evaluation    Row Name 03/20/16 [phone] 03/29/16 0940            Psychosocial Re-Evaluation   Comments Zaylynn said this is helping her and glad to attend. Counselor follow up with Ms. S[facility]) reporting she is experiencing positive benefits from this program with sleeping better and her strength and energy has increased during the day.  She plans to see an orthopaedist in April about her knee to hopefully get some help with that problem.  She remains on her current anxiety medications and states they are working to treat her current symptoms.  Staff will continue to follow with Tammye.      Interventions Encouraged to attend Cardiac Rehabilitation for the exercise  -         Psychosocial Discharge (Final Psychosocial Re-Evaluation):     Psychosocial Re-Evaluation - 03/29/16 0940      Psychosocial Re-Evaluation   Comments Counselor follow up with Ms. Sigler [facility]) reporting she is experiencing positive benefits from this program with sleeping better and her strength and energy has increased during the day.  She plans to see an orthopaedist in April about her knee to hopefully get some help with that problem.  She remains on her current anxiety medications and states they are working to treat her current symptoms.  Staff will continue to follow with Levia.      Vocational Rehabilitation: Provide vocational rehab assistance to qualifying candidates.   Vocational Rehab Evaluation & Intervention:     Vocational Rehab - 02/27/16 1343      Initial Vocational Rehab Evaluation & Intervention   Assessment shows need for Vocational Rehabilitation No      Education: Education Goals: Education classes will be provided on a weekly basis, covering required topics. Participant will state understanding/return demonstration of topics presented.  Learning Barriers/Preferences:     Learning Barriers/Preferences - 02/27/16 1342      Learning Barriers/Preferences   Learning Barriers --  Attention deficit   Learning Preferences Pictoral;Skilled  Demonstration;Written Material;None      Education Topics: General Nutrition Guidelines/Fats and Fiber: -Group instruction provided by verbal, written material, models and posters to present the general guidelines for heart healthy nutrition. Gives an explanation and review of dietary fats and fiber.   Controlling Sodium/Reading Food Labels: -Group verbal and written material supporting the discussion of sodium use in heart healthy nutrition. Review and explanation with models, verbal and written materials for utilization of the food label.   Exercise Physiology & Risk Factors: - Group verbal and written instruction with models to review the exercise physiology of the cardiovascular system and associated critical values. Details cardiovascular disease risk factors and the  goals associated with each risk factor. Flowsheet Row Cardiac Rehab from 04/10/2016 in [facility] Cardiac and Pulmonary Rehab  Date  03/06/16  Educator  LB  Instruction Review Code  2- meets goals/outcomes      Aerobic Exercise & Resistance Training: - Gives group verbal and written discussion on the health impact of inactivity. On the components of aerobic and resistive training programs and the benefits of this training and how to safely progress through these programs. Flowsheet Row Cardiac Rehab from 04/10/2016 in [facility] Cardiac and Pulmonary Rehab  Date  03/07/16  Educator  [facility]  Instruction Review Code  2- meets goals/outcomes      Flexibility, Balance, General Exercise Guidelines: - Provides group verbal and written instruction on the benefits of flexibility and balance training programs. Provides general exercise guidelines with specific guidelines to those with heart or lung disease. Demonstration and skill practice provided. Flowsheet Row Cardiac Rehab from 04/10/2016 in [facility] Cardiac and Pulmonary Rehab  Date  03/13/16  Educator  [facility]  Instruction Review Code  2- meets goals/outcomes      Stress Management: -  Provides group verbal and written instruction about the health risks of elevated stress, cause of high stress, and healthy ways to reduce stress. Flowsheet Row Cardiac Rehab from 04/10/2016 in [facility] Cardiac and Pulmonary Rehab  Date  03/22/16  Educator  TS  Instruction Review Code  2- meets goals/outcomes      Depression: - Provides group verbal and written instruction on the correlation between heart/lung disease and depressed mood, treatment options, and the stigmas associated with seeking treatment.   Anatomy & Physiology of the Heart: - Group verbal and written instruction and models provide basic cardiac anatomy and physiology, with the coronary electrical and arterial systems. Review of: AMI, Angina, Valve disease, Heart Failure, Cardiac Arrhythmia, Pacemakers, and the ICD. Flowsheet Row Cardiac Rehab from 04/10/2016 in [facility] Cardiac and Pulmonary Rehab  Date  03/20/16  Educator  CE  Instruction Review Code  2- meets goals/outcomes      Cardiac Procedures: - Group verbal and written instruction and models to describe the testing methods done to diagnose heart disease. Reviews the outcomes of the test results. Describes the treatment choices: Medical Management, Angioplasty, or Coronary Bypass Surgery. Flowsheet Row Cardiac Rehab from 04/10/2016 in [facility] Cardiac and Pulmonary Rehab  Date  03/27/16  Educator  CE  Instruction Review Code  2- meets goals/outcomes      Cardiac Medications: - Group verbal and written instruction to review commonly prescribed medications for heart disease. Reviews the medication, class of the drug, and side effects. Includes the steps to properly store meds and maintain the prescription regimen. Flowsheet Row Cardiac Rehab from 04/10/2016 in [facility] Cardiac and Pulmonary Rehab  Date  04/03/16 [04/04/16 Second Part CE]  Educator  SB  Instruction Review Code  2- meets goals/outcomes      Go Sex-Intimacy & Heart Disease, Get SMART - Goal Setting: - Group  verbal and written instruction through game format to discuss heart disease and the return to sexual intimacy. Provides group verbal and written material to discuss and apply goal setting through the application of the S.M.A.R.T. Method. Flowsheet Row Cardiac Rehab from 04/10/2016 in [facility] Cardiac and Pulmonary Rehab  Date  03/27/16  Educator  CE  Instruction Review Code  2- meets goals/outcomes      Other Matters of the Heart: - Provides group verbal, written materials and models to describe Heart Failure, Angina, Valve Disease, and Diabetes in the  realm of heart disease. Includes description of the disease process and treatment options available to the cardiac patient. Flowsheet Row Cardiac Rehab from 04/10/2016 in [facility] Cardiac and Pulmonary Rehab  Date  03/20/16  Educator  CE  Instruction Review Code  2- meets goals/outcomes      Exercise & Equipment Safety: - Individual verbal instruction and demonstration of equipment use and safety with use of the equipment. Flowsheet Row Cardiac Rehab from 04/10/2016 in [facility] Cardiac and Pulmonary Rehab  Date  02/27/16  Educator  SB  Instruction Review Code  2- meets goals/outcomes      Infection Prevention: - Provides verbal and written material to individual with discussion of infection control including proper hand washing and proper equipment cleaning during exercise session. Flowsheet Row Cardiac Rehab from 04/10/2016 in [facility] Cardiac and Pulmonary Rehab  Date  02/27/16  Educator  SB  Instruction Review Code  2- meets goals/outcomes      Falls Prevention: - Provides verbal and written material to individual with discussion of falls prevention and safety. Flowsheet Row Cardiac Rehab from 04/10/2016 in [facility] Cardiac and Pulmonary Rehab  Date  02/27/16  Educator  Sb  Instruction Review Code  2- meets goals/outcomes      Diabetes: - Individual verbal and written instruction to review signs/symptoms of diabetes, desired ranges of glucose  level fasting, after meals and with exercise. Advice that pre and post exercise glucose checks will be done for 3 sessions at entry of program.    Knowledge Questionnaire Score:     Knowledge Questionnaire Score - 04/10/16 1538      Knowledge Questionnaire Score   Pre Score 20/28   Post Score 24/28      Core Components/Risk Factors/Patient Goals at Admission:     Personal Goals and Risk Factors at Admission - 02/27/16 1332      Core Components/Risk Factors/Patient Goals on Admission    Weight Management Obesity;Weight Loss;Yes   Intervention Weight Management: Develop a combined nutrition and exercise program designed to reach desired caloric intake, while maintaining appropriate intake of nutrient and fiber, sodium and fats, and appropriate energy expenditure required for the weight goal.;Weight Management: Provide education and appropriate resources to help participant work on and attain dietary goals.;Weight Management/Obesity: Establish reasonable short term and long term weight goals.;Obesity: Provide education and appropriate resources to help participant work on and attain dietary goals.   Admit Weight 154 lb 1.6 oz (69.9 kg)   Goal Weight: Short Term 150 lb (68 kg)   Goal Weight: Long Term 130 lb (59 kg)   Expected Outcomes Short Term: Continue to assess and modify interventions until short term weight is achieved;Long Term: Adherence to nutrition and physical activity/exercise program aimed toward attainment of established weight goal;Weight Loss: Understanding of general recommendations for a balanced deficit meal plan, which promotes 1-2 lb weight loss per week and includes a negative energy balance of [phone] kcal/d   Sedentary Yes   Intervention Provide advice, education, support and counseling about physical activity/exercise needs.;Develop an individualized exercise prescription for aerobic and resistive training based on initial evaluation findings, risk stratification,  comorbidities and participant's personal goals.   Expected Outcomes Achievement of increased cardiorespiratory fitness and enhanced flexibility, muscular endurance and strength shown through measurements of functional capacity and personal statement of participant.   Increase Strength and Stamina Yes   Intervention Provide advice, education, support and counseling about physical activity/exercise needs.;Develop an individualized exercise prescription for aerobic and resistive training based on initial evaluation findings,  risk stratification, comorbidities and participant's personal goals.   Expected Outcomes Achievement of increased cardiorespiratory fitness and enhanced flexibility, muscular endurance and strength shown through measurements of functional capacity and personal statement of participant.   Hypertension Yes   Intervention Provide education on lifestyle modifcations including regular physical activity/exercise, weight management, moderate sodium restriction and increased consumption of fresh fruit, vegetables, and low fat dairy, alcohol moderation, and smoking cessation.;Monitor prescription use compliance.   Expected Outcomes Short Term: Continued assessment and intervention until BP is < 140/60m HG in hypertensive participants. < 130/877mHG in hypertensive participants with diabetes, heart failure or chronic kidney disease.;Long Term: Maintenance of blood pressure at goal levels.   Lipids Yes   Intervention Provide education and support for participant on nutrition & aerobic/resistive exercise along with prescribed medications to achieve LDL <7025mHDL >60m49m Expected Outcomes Short Term: Participant states understanding of desired cholesterol values and is compliant with medications prescribed. Participant is following exercise prescription and nutrition guidelines.;Long Term: Cholesterol controlled with medications as prescribed, with individualized exercise RX and with personalized  nutrition plan. Value goals: LDL < 70mg28mL > 40 mg.   Stress Yes  Current stress is being resolved soon.  Has non-family person living with her and it has become stressful   Intervention Offer individual and/or small group education and counseling on adjustment to heart disease, stress management and health-related lifestyle change. Teach and support self-help strategies.;Refer participants experiencing significant psychosocial distress to appropriate mental health specialists for further evaluation and treatment. When possible, include family members and significant others in education/counseling sessions.   Expected Outcomes Short Term: Participant demonstrates changes in health-related behavior, relaxation and other stress management skills, ability to obtain effective social support, and compliance with psychotropic medications if prescribed.;Long Term: Emotional wellbeing is indicated by absence of clinically significant psychosocial distress or social isolation.      Core Components/Risk Factors/Patient Goals Review:      Goals and Risk Factor Review    Row Name 03/20/16 0943 [phone]        Core Components/Risk Factors/Patient Goals Review   Personal Goals Review Weight Management/Obesity;Hypertension;Lipids       Review WEight still 154lbs. She is going to [employer]. Briseidy's MD changed her from Lisinopril to Metoprolol. She feels her cholestrol medicine is making her leg sore. She has a call into her MD already.        Expected Outcomes Heart healthy lifestyle          Core Components/Risk Factors/Patient Goals at Discharge (Final Review):      Goals and Risk Factor Review - 03/20/16 0943      Core Components/Risk Factors/Patient Goals Review   Personal Goals Review Weight Management/Obesity;Hypertension;Lipids   Review WEight still 154lbs. She is going to [employer]. Juni's MD changed her from Lisinopril to Metoprolol. She feels her cholestrol medicine is making her  leg sore. She has a call into her MD already.    Expected Outcomes Heart healthy lifestyle      ITP Comments:     ITP Comments    Row Name 02/27/16 1328 03/14/16 0555 03/14/16 0611 03/20/16 0934 03/21/16 0859   ITP Comments Medical Review completed. Initial ITP created.  Diagnosis documentation can be found in [facility]. Continue with ITP unless directed changes per Medical Director review. 30 day review. Continue with ITP unless directed changes per Medical Director review. Cherri said she is  having right leg pain she thinks due to her cholestrol medicine. I gave her a stretching exercise to see if that helps too. Christasia came in today for exercise.  She was limping more than normal.  She decided to go see Dr. Ubaldo Glassing instead of exercising today to see if he could reduce her medication.   [place] Name 03/21/16 1451 04/11/16 0556         ITP Comments Called to check on pt.  She was told that she could hold her statin for a week to see if she has any pain relief.  She hopes to be here tomorrow. 30 day review. Continue with ITP unless directed changes per Medical Director review         Comments:

## 2016-04-12 ENCOUNTER — Encounter: Payer: Medicare Other | Attending: Cardiology

## 2016-04-12 DIAGNOSIS — Z48812 Encounter for surgical aftercare following surgery on the circulatory system: Secondary | ICD-10-CM | POA: Insufficient documentation

## 2016-04-12 DIAGNOSIS — I213 ST elevation (STEMI) myocardial infarction of unspecified site: Secondary | ICD-10-CM | POA: Insufficient documentation

## 2016-04-12 DIAGNOSIS — Z9861 Coronary angioplasty status: Secondary | ICD-10-CM | POA: Diagnosis not present

## 2016-04-12 NOTE — Progress Notes (Signed)
Daily Session Note  Patient Details  Name: [name] MRN: [id number] Date of Birth: 1950-12-31 Referring Provider:   Flowsheet Row Cardiac Rehab from 02/27/2016 in [facility] Cardiac and Pulmonary Rehab  Referring Provider  Bartholome Bill MD      Encounter Date: 04/12/2016  Check In:     Session Check In - 04/12/16 0836      Check-In   Location [facility]-Cardiac & Pulmonary Rehab   Staff Present Alberteen Sam, MA, ACSM RCEP, Exercise Physiologist;Patricia Surles RN Vickki Hearing, BA, ACSM CEP, Exercise Physiologist   Supervising physician immediately available to respond to emergencies See telemetry face sheet for immediately available ER MD   Medication changes reported     No   Fall or balance concerns reported    No   Tobacco Cessation No Change   Warm-up and Cool-down Performed on first and last piece of equipment   Resistance Training Performed Yes   VAD Patient? No         History  Smoking Status  . Former Smoker  . Quit date: 02/13/1983  Smokeless Tobacco  . Not on file    Goals Met:  Independence with exercise equipment Exercise tolerated well No report of cardiac concerns or symptoms Strength training completed today  Goals Unmet:  Not Applicable  Comments: Pt able to follow exercise prescription today without complaint.  Will continue to monitor for progression.    Dr. Emily Filbert is Medical Director for [place] and [facility].

## 2016-04-17 ENCOUNTER — Encounter: Payer: Medicare Other | Admitting: Respiratory Therapy

## 2016-04-17 VITALS — Ht 63.2 in | Wt 155.0 lb

## 2016-04-17 DIAGNOSIS — Z48812 Encounter for surgical aftercare following surgery on the circulatory system: Secondary | ICD-10-CM | POA: Diagnosis not present

## 2016-04-17 DIAGNOSIS — I213 ST elevation (STEMI) myocardial infarction of unspecified site: Secondary | ICD-10-CM

## 2016-04-17 DIAGNOSIS — Z9861 Coronary angioplasty status: Secondary | ICD-10-CM

## 2016-04-17 NOTE — Progress Notes (Signed)
Daily Session Note  Patient Details  Name: [name] MRN: [id number] Date of Birth: 08/08/1950 Referring Provider:   Flowsheet Row Cardiac Rehab from 02/27/2016 in [facility] Cardiac and Pulmonary Rehab  Referring Provider  Bartholome Bill MD      Encounter Date: 04/17/2016  Check In:     Session Check In - 04/17/16 0820      Check-In   Location [facility]-Cardiac & Pulmonary Rehab   Staff Present Alberteen Sam, MA, ACSM RCEP, Exercise Physiologist;Susanne Bice, RN, BSN, CCRP;Laureen Owens Shark, BS, RRT, Respiratory Therapist   Supervising physician immediately available to respond to emergencies See telemetry face sheet for immediately available ER MD   Medication changes reported     No   Fall or balance concerns reported    No   Warm-up and Cool-down Performed on first and last piece of equipment   Resistance Training Performed Yes   VAD Patient? No     Pain Assessment   Currently in Pain? No/denies   Multiple Pain Sites No         History  Smoking Status   Former Smoker   Quit date: 02/13/1983  Smokeless Tobacco   Not on file    Goals Met:  Proper associated with RPD/PD & O2 Sat Independence with exercise equipment Exercise tolerated well No report of cardiac concerns or symptoms Strength training completed today  Goals Unmet:  Not Applicable  Comments: Pt able to follow exercise prescription today without complaint.  Will continue to monitor for progression.      [name] Name 02/27/16 1623 04/17/16 1018       6 Minute Walk   Phase Initial Discharge    Distance 1530 feet 1700 feet    Distance % Change  -- 11.1 %  170 ft    Walk Time 6 minutes 6 minutes    # of Rest Breaks 0 0    MPH 2.9 3.22    METS 3.67 3.88    RPE 10 12    VO2 Peak 12.8 13.58    Symptoms No Yes (comment)    Comments  -- right leg/hip pain 6/10    Resting HR 7 bpm 66 bpm    Resting BP 126/54 116/60    Max Ex. HR 113 bpm 98 bpm    Max Ex. BP 134/70 144/74    2 Minute Post  BP 126/64  --        Dr. Emily Filbert is Medical Director for [place] and [facility].

## 2016-04-19 DIAGNOSIS — I213 ST elevation (STEMI) myocardial infarction of unspecified site: Secondary | ICD-10-CM

## 2016-04-19 DIAGNOSIS — Z48812 Encounter for surgical aftercare following surgery on the circulatory system: Secondary | ICD-10-CM | POA: Diagnosis not present

## 2016-04-19 DIAGNOSIS — Z9861 Coronary angioplasty status: Secondary | ICD-10-CM

## 2016-04-19 NOTE — Progress Notes (Signed)
Daily Session Note  Patient Details  Name: [name] MRN: [id number] Date of Birth: [birth year] Referring Provider:   Flowsheet Row Cardiac Rehab from 02/27/2016 in [facility] Cardiac and Pulmonary Rehab  Referring Provider  Bartholome Bill MD      Encounter Date: 04/19/2016  Check In:     Session Check In - 04/19/16 0938      Check-In   Location [facility]-Cardiac & Pulmonary Rehab   Staff Present Alberteen Sam, MA, ACSM RCEP, Exercise Physiologist;Patricia Surles RN Vickki Hearing, BA, ACSM CEP, Exercise Physiologist   Supervising physician immediately available to respond to emergencies See telemetry face sheet for immediately available ER MD   Medication changes reported     No   Fall or balance concerns reported    No   Warm-up and Cool-down Performed on first and last piece of equipment   Resistance Training Performed Yes   VAD Patient? No     Pain Assessment   Currently in Pain? No/denies           Exercise Prescription Changes - 04/18/16 1400      Response to Exercise   Blood Pressure (Admit) 116/60   Blood Pressure (Exercise) 144/74   Blood Pressure (Exit) 124/62   Heart Rate (Admit) 66 bpm   Heart Rate (Exercise) 124 bpm   Heart Rate (Exit) 76 bpm   Rating of Perceived Exertion (Exercise) 13   Symptoms hip/knee pain   Duration Continue with 45 min of aerobic exercise without signs/symptoms of physical distress.   Intensity THRR unchanged     Progression   Progression Continue to progress workloads to maintain intensity without signs/symptoms of physical distress.   Average METs 3.83     Resistance Training   Training Prescription Yes   Weight 5 lbs   Reps 10-15     Interval Training   Interval Training No     Treadmill   MPH 2.6   Grade 1   Minutes 15   METs 3.35     NuStep   Level 5   Minutes 15   METs 4.3     Recumbant Elliptical   Level 2   Minutes 15     Home Exercise Plan   Plans to continue exercise at [employer] (comment)   [facility]   Frequency Add 2 additional days to program exercise sessions.   Initial Home Exercises Provided 03/14/16      History  Smoking Status  . Former Smoker  . Quit date: 02/13/1983  Smokeless Tobacco  . Not on file    Goals Met:  Independence with exercise equipment Exercise tolerated well No report of cardiac concerns or symptoms Strength training completed today  Goals Unmet:  Not Applicable  Comments: Pt able to follow exercise prescription today without complaint.  Will continue to monitor for progression.    Dr. Emily Filbert is Medical Director for [place] and [facility].

## 2016-04-25 ENCOUNTER — Encounter: Payer: Medicare Other | Admitting: *Deleted

## 2016-04-25 DIAGNOSIS — Z9861 Coronary angioplasty status: Secondary | ICD-10-CM

## 2016-04-25 DIAGNOSIS — Z48812 Encounter for surgical aftercare following surgery on the circulatory system: Secondary | ICD-10-CM | POA: Diagnosis not present

## 2016-04-25 DIAGNOSIS — I213 ST elevation (STEMI) myocardial infarction of unspecified site: Secondary | ICD-10-CM

## 2016-04-25 NOTE — Progress Notes (Signed)
Daily Session Note  Patient Details  Name: [name] MRN: [id number] Date of Birth: 01/06/1951 Referring Provider:   Flowsheet Row Cardiac Rehab from 02/27/2016 in [facility] Cardiac and Pulmonary Rehab  Referring Provider  Bartholome Bill MD      Encounter Date: 04/25/2016  Check In:     Session Check In - 04/25/16 0852      Check-In   Staff Present Heath Lark, RN, BSN, Lance Sell, BA, ACSM CEP, Exercise Physiologist;Other  Darel Hong RN BSN   Medication changes reported     No   Fall or balance concerns reported    No   Warm-up and Cool-down Performed on first and last piece of equipment   Resistance Training Performed Yes   VAD Patient? No     Pain Assessment   Currently in Pain? No/denies         History  Smoking Status  . Former Smoker  . Quit date: 02/13/1983  Smokeless Tobacco  . Not on file    Goals Met:  Personal goals reviewed No report of cardiac concerns or symptoms Strength training completed today  Goals Unmet:  Not Applicable  Comments: Doing well with exercise prescription progression.  [name] has knee and hip problems and today she had to stop for increased pain, resting and changing equipment reduced the pain and she was able to continue exercise. She has an April 4th appointment with an ortho MD    Dr. Emily Filbert is Medical Director for [place] and [facility].

## 2016-04-26 DIAGNOSIS — Z48812 Encounter for surgical aftercare following surgery on the circulatory system: Secondary | ICD-10-CM | POA: Diagnosis not present

## 2016-04-26 DIAGNOSIS — Z9861 Coronary angioplasty status: Secondary | ICD-10-CM

## 2016-04-26 DIAGNOSIS — I213 ST elevation (STEMI) myocardial infarction of unspecified site: Secondary | ICD-10-CM

## 2016-04-26 NOTE — Progress Notes (Signed)
Daily Session Note  Patient Details  Name: Alyson Ki MRN: [id number] Date of Birth: 23-Mar-1950 Referring Provider:     Cardiac Rehab from 02/27/2016 in [facility] Cardiac and Pulmonary Rehab  Referring Provider  Bartholome Bill MD      Encounter Date: 04/26/2016  Check In:     Session Check In - 04/26/16 1035      Check-In   Location [facility]-Cardiac & Pulmonary Rehab   Staff Present Levell July RN Vickki Hearing, BA, ACSM CEP, Exercise Physiologist;Other  Darel Hong RN   Supervising physician immediately available to respond to emergencies See telemetry face sheet for immediately available ER MD   Medication changes reported     No   Fall or balance concerns reported    No   Warm-up and Cool-down Performed on first and last piece of equipment   Resistance Training Performed Yes   VAD Patient? No     Pain Assessment   Currently in Pain? No/denies         History  Smoking Status  . Former Smoker  . Quit date: 02/13/1983  Smokeless Tobacco  . Not on file    Goals Met:  Proper associated with RPD/PD & O2 Sat Independence with exercise equipment Exercise tolerated well Strength training completed today  Goals Unmet:  Not Applicable  Comments: Pt able to follow exercise prescription today without complaint.  Will continue to monitor for progression.    Dr. Emily Filbert is Medical Director for [place] and [facility].

## 2016-05-01 ENCOUNTER — Encounter: Payer: Medicare Other | Admitting: Respiratory Therapy

## 2016-05-01 DIAGNOSIS — Z48812 Encounter for surgical aftercare following surgery on the circulatory system: Secondary | ICD-10-CM | POA: Diagnosis not present

## 2016-05-01 DIAGNOSIS — Z9861 Coronary angioplasty status: Secondary | ICD-10-CM

## 2016-05-01 DIAGNOSIS — I213 ST elevation (STEMI) myocardial infarction of unspecified site: Secondary | ICD-10-CM

## 2016-05-01 NOTE — Patient Instructions (Signed)
Discharge Progress Report  Patient Details  Name: [name] MRN: [id number] Date of Birth: 20-Jun-1950 Referring Provider:  Teodoro Spray, MD   Number of Visits: 36/36  Reason for Discharge:  Patient reached a stable level of exercise. Patient independent in their exercise.  Smoking History:  History  Smoking Status  . Former Smoker  . Quit date: 02/13/1983  Smokeless Tobacco  . Not on file    Diagnosis:  ST elevation myocardial infarction (STEMI), unspecified artery (HCC)  S/P PTCA (percutaneous transluminal coronary angioplasty)  Initial Exercise Prescription:     Initial Exercise Prescription - 02/27/16 1600      Date of Initial Exercise RX and Referring Provider   Date 02/27/16   Referring Provider Bartholome Bill MD     Treadmill   MPH 2.6   Grade 0.5   Minutes 15   METs 3.17     NuStep   Level 3   Minutes 15   METs 2     Recumbant Elliptical   Level 2   RPM 50   Minutes 15   METs 2     Prescription Details   Frequency (times per week) 3   Duration Progress to 45 minutes of aerobic exercise without signs/symptoms of physical distress     Intensity   THRR 40-80% of Max Heartrate 100-137   Ratings of Perceived Exertion 11-15   Perceived Dyspnea 0-4     Progression   Progression Continue to progress workloads to maintain intensity without signs/symptoms of physical distress.     Resistance Training   Training Prescription Yes   Weight 3 lbs   Reps 10-15      Discharge Exercise Prescription (Final Exercise Prescription Changes):     Exercise Prescription Changes - 04/18/16 1400      Response to Exercise   Blood Pressure (Admit) 116/60   Blood Pressure (Exercise) 144/74   Blood Pressure (Exit) 124/62   Heart Rate (Admit) 66 bpm   Heart Rate (Exercise) 124 bpm   Heart Rate (Exit) 76 bpm   Rating of Perceived Exertion (Exercise) 13   Symptoms hip/knee pain   Duration Continue with 45 min of aerobic exercise without signs/symptoms of  physical distress.   Intensity THRR unchanged     Progression   Progression Continue to progress workloads to maintain intensity without signs/symptoms of physical distress.   Average METs 3.83     Resistance Training   Training Prescription Yes   Weight 5 lbs   Reps 10-15     Interval Training   Interval Training No     Treadmill   MPH 2.6   Grade 1   Minutes 15   METs 3.35     NuStep   Level 5   Minutes 15   METs 4.3     Recumbant Elliptical   Level 2   Minutes 15     Home Exercise Plan   Plans to continue exercise at [employer] (comment)  [facility]   Frequency Add 2 additional days to program exercise sessions.   Initial Home Exercises Provided 03/14/16      Functional Capacity:     6 Minute Walk    Row Name 02/27/16 1623 04/17/16 1018       6 Minute Walk   Phase Initial Discharge    Distance 1530 feet 1700 feet    Distance % Change  - 11.1 %  170 ft    Walk Time 6 minutes 6 minutes    #  of Rest Breaks 0 0    MPH 2.9 3.22    METS 3.67 3.88    RPE 10 12    VO2 Peak 12.8 13.58    Symptoms No Yes (comment)    Comments  - right leg/hip pain 6/10    Resting HR 7 bpm 66 bpm    Resting BP 126/54 116/60    Max Ex. HR 113 bpm 98 bpm    Max Ex. BP 134/70 144/74    2 Minute Post BP 126/64  -       Quality of Life:     Quality of Life - 04/10/16 1538      Quality of Life Scores   Health/Function Pre 19.33 %   Health/Function Post 21.77 %   Health/Function % Change 12.62 %   Socioeconomic Pre 24 %   Socioeconomic Post 24 %   Socioeconomic % Change  0 %   Psych/Spiritual Pre 30 %   Psych/Spiritual Post 22.57 %   Psych/Spiritual % Change -24.77 %   Family Pre 28.8 %   Family Post 24 %   Family % Change -16.67 %   GLOBAL Pre 24 %   GLOBAL Post 22.72 %   GLOBAL % Change -5.33 %      Personal Goals: Goals established at orientation with interventions provided to work toward goal.     Personal Goals and Risk  Factors at Admission - 02/27/16 1332      Core Components/Risk Factors/Patient Goals on Admission    Weight Management Obesity;Weight Loss;Yes   Intervention Weight Management: Develop a combined nutrition and exercise program designed to reach desired caloric intake, while maintaining appropriate intake of nutrient and fiber, sodium and fats, and appropriate energy expenditure required for the weight goal.;Weight Management: Provide education and appropriate resources to help participant work on and attain dietary goals.;Weight Management/Obesity: Establish reasonable short term and long term weight goals.;Obesity: Provide education and appropriate resources to help participant work on and attain dietary goals.   Admit Weight 154 lb 1.6 oz (69.9 kg)   Goal Weight: Short Term 150 lb (68 kg)   Goal Weight: Long Term 130 lb (59 kg)   Expected Outcomes Short Term: Continue to assess and modify interventions until short term weight is achieved;Long Term: Adherence to nutrition and physical activity/exercise program aimed toward attainment of established weight goal;Weight Loss: Understanding of general recommendations for a balanced deficit meal plan, which promotes 1-2 lb weight loss per week and includes a negative energy balance of [phone] kcal/d   Sedentary Yes   Intervention Provide advice, education, support and counseling about physical activity/exercise needs.;Develop an individualized exercise prescription for aerobic and resistive training based on initial evaluation findings, risk stratification, comorbidities and participant's personal goals.   Expected Outcomes Achievement of increased cardiorespiratory fitness and enhanced flexibility, muscular endurance and strength shown through measurements of functional capacity and personal statement of participant.   Increase Strength and Stamina Yes   Intervention Provide advice, education, support and counseling about physical activity/exercise  needs.;Develop an individualized exercise prescription for aerobic and resistive training based on initial evaluation findings, risk stratification, comorbidities and participant's personal goals.   Expected Outcomes Achievement of increased cardiorespiratory fitness and enhanced flexibility, muscular endurance and strength shown through measurements of functional capacity and personal statement of participant.   Hypertension Yes   Intervention Provide education on lifestyle modifcations including regular physical activity/exercise, weight management, moderate sodium restriction and increased consumption of fresh fruit, vegetables, and low fat dairy,  alcohol moderation, and smoking cessation.;Monitor prescription use compliance.   Expected Outcomes Short Term: Continued assessment and intervention until BP is < 140/25m HG in hypertensive participants. < 130/87mHG in hypertensive participants with diabetes, heart failure or chronic kidney disease.;Long Term: Maintenance of blood pressure at goal levels.   Lipids Yes   Intervention Provide education and support for participant on nutrition & aerobic/resistive exercise along with prescribed medications to achieve LDL <7048mHDL >17m77m Expected Outcomes Short Term: Participant states understanding of desired cholesterol values and is compliant with medications prescribed. Participant is following exercise prescription and nutrition guidelines.;Long Term: Cholesterol controlled with medications as prescribed, with individualized exercise RX and with personalized nutrition plan. Value goals: LDL < 70mg47mL > 40 mg.   Stress Yes  Current stress is being resolved soon.  Has non-family person living with her and it has become stressful   Intervention Offer individual and/or small group education and counseling on adjustment to heart disease, stress management and health-related lifestyle change. Teach and support self-help strategies.;Refer participants  experiencing significant psychosocial distress to appropriate mental health specialists for further evaluation and treatment. When possible, include family members and significant others in education/counseling sessions.   Expected Outcomes Short Term: Participant demonstrates changes in health-related behavior, relaxation and other stress management skills, ability to obtain effective social support, and compliance with psychotropic medications if prescribed.;Long Term: Emotional wellbeing is indicated by absence of clinically significant psychosocial distress or social isolation.       Personal Goals Discharge:     Goals and Risk Factor Review - 04/27/16 1051      Core Components/Risk Factors/Patient Goals Review   Review Still doing Weight Watchers. Sakai wants and hopes this 3rd knee doctor will help her (appt is April 4th). I suggested the option of going ahead and doing at least the evaluation for physical therapy since that should help her constant knee pain. Kimmie reported that she has met indiviudally with the Cardiac Rehab REgistered Dietician and that she has already cut back on Saturated Fats and sodium.    Expected Outcomes Heart healthy lifestyle      Nutrition & Weight - Outcomes:     Pre Biometrics - 02/27/16 1624      Pre Biometrics   Height 5' 3.2" (1.605 m)   Weight 154 lb 8 oz (70.1 kg)   Waist Circumference 33 inches   Hip Circumference 40 inches   Waist to Hip Ratio 0.82 %   BMI (Calculated) 27.3   Single Leg Stand 28.51 seconds         Post Biometrics - 04/17/16 1019       Post  Biometrics   Height 5' 3.2" (1.605 m)   Weight 155 lb (70.3 kg)   Waist Circumference 32 inches   Hip Circumference 40 inches   Waist to Hip Ratio 0.8 %   BMI (Calculated) 27.3   Single Leg Stand 15.91 seconds      Nutrition:     Nutrition Therapy & Goals - 02/27/16 1332      Intervention Plan   Intervention Prescribe, educate and counsel regarding individualized  specific dietary modifications aiming towards targeted core components such as weight, hypertension, lipid management, diabetes, heart failure and other comorbidities.   Expected Outcomes Short Term Goal: Understand basic principles of dietary content, such as calories, fat, sodium, cholesterol and nutrients.;Short Term Goal: A plan has been developed with personal nutrition goals set during dietitian appointment.;Long Term Goal: Adherence to prescribed nutrition  plan.      Nutrition Discharge:     Nutrition Assessments - 04/10/16 1536      Rate Your Plate Scores   Post Score 78   Post Score % 86.7 %     MEDFICTS Scores   Pre Score 28   Post Score 24   Score Difference -4      Education Questionnaire Score:     Knowledge Questionnaire Score - 04/10/16 1538      Knowledge Questionnaire Score   Pre Score 20/28   Post Score 24/28      Goals reviewed with patient; copy given to patient.

## 2016-05-01 NOTE — Progress Notes (Signed)
Daily Session Note  Patient Details  Name: [name] MRN: [id number] Date of Birth: 1950/10/03 Referring Provider:     Cardiac Rehab from 02/27/2016 in [facility]  Referring Provider  Bartholome Bill MD      Encounter Date: 05/01/2016  Check In:     Session Check In - 05/01/16 0828      Check-In   Location [facility]-Cardiac & Pulmonary Rehab   Staff Present Carson Myrtle, BS, RRT, Respiratory Lennie Hummer, MA, ACSM RCEP, Exercise Physiologist;Susanne Bice, RN, BSN, CCRP   Supervising physician immediately available to respond to emergencies See telemetry face sheet for immediately available ER MD   Medication changes reported     No   Fall or balance concerns reported    No   Tobacco Cessation No Change   Warm-up and Cool-down Performed on first and last piece of equipment   Resistance Training Performed Yes   VAD Patient? No     Pain Assessment   Currently in Pain? No/denies   Multiple Pain Sites No         History  Smoking Status   Former Smoker   Quit date: 02/13/1983  Smokeless Tobacco   Not on file    Goals Met:  Proper associated with RPD/PD & O2 Sat Independence with exercise equipment Exercise tolerated well No report of cardiac concerns or symptoms Strength training completed today  Goals Unmet:  Not Applicable  Comments: Pt able to follow exercise prescription today without complaint.  Will continue to monitor for progression.   Dr. Emily Filbert is Medical Director for [facility] and [facility].

## 2016-05-09 ENCOUNTER — Encounter: Payer: Self-pay | Admitting: *Deleted

## 2016-05-09 DIAGNOSIS — I213 ST elevation (STEMI) myocardial infarction of unspecified site: Secondary | ICD-10-CM

## 2016-05-09 DIAGNOSIS — Z9861 Coronary angioplasty status: Secondary | ICD-10-CM

## 2016-05-09 NOTE — Progress Notes (Signed)
Cardiac Individual Treatment Plan  Patient Details  Name: [name] MRN: [id number] Date of Birth: 1950-10-12 Referring Provider:     Cardiac Rehab from 02/27/2016 in [facility] Cardiac and Pulmonary Rehab  Referring Provider  Bartholome Bill MD      Initial Encounter Date:    Cardiac Rehab from 02/27/2016 in [facility] Cardiac and Pulmonary Rehab  Date  02/27/16  Referring Provider  Bartholome Bill MD      Visit Diagnosis: ST elevation myocardial infarction (STEMI), unspecified artery ([place])  S/P PTCA (percutaneous transluminal coronary angioplasty)  Patient's Home Medications on Admission:  Current Outpatient Prescriptions:  .  amlodipine-benazepril (LOTREL) 2.5-10 MG per capsule, Take 1 capsule by mouth daily., Disp: , Rfl:  .  clopidogrel (PLAVIX) 75 MG tablet, Take 75 mg by mouth daily., Disp: , Rfl:  .  cyclobenzaprine (FLEXERIL) 10 MG tablet, Take 1 tablet (10 mg total) by mouth 3 (three) times daily as needed for muscle spasms., Disp: 30 tablet, Rfl: 0 .  EPINEPHrine 1 MG/ML SOLN, Inject as directed., Disp: , Rfl:  .  estradiol (ESTRACE) 0.1 MG/GM vaginal cream, Place 1 Applicatorful vaginally 3 (three) times a week., Disp: , Rfl:  .  hydroxychloroquine (PLAQUENIL) 200 MG tablet, Take by mouth daily., Disp: , Rfl:  .  METOPROLOL TARTRATE PO, Take 25 mg by mouth., Disp: , Rfl:  .  oxybutynin (DITROPAN) 5 MG tablet, Take 5 mg by mouth 3 (three) times daily., Disp: , Rfl:  .  traMADol (ULTRAM) 50 MG tablet, Take 1 tablet (50 mg total) by mouth every 6 (six) hours as needed., Disp: 9 tablet, Rfl: 0 .  venlafaxine (EFFEXOR) 75 MG tablet, Take 75 mg by mouth 2 (two) times daily., Disp: , Rfl:   Past Medical History: Past Medical History:  Diagnosis Date  . Anxiety   . Cystocele   . Hypertension   . PMB (postmenopausal bleeding)   . Rectocele   . SUI (stress urinary incontinence, female)   . Unstable bladder   . Urination frequency   . 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 Vaginal atrophy   . Vaginal itching     Tobacco Use: History  Smoking Status  . Former Smoker  . Quit date: 02/13/1983  Smokeless Tobacco  . Not on file    Labs: Recent Review Flowsheet Data    There is no flowsheet data to display.       Exercise Target Goals:    Exercise Program Goal: Individual exercise prescription set with THRR, safety & activity barriers. Participant demonstrates ability to understand and report RPE using BORG scale, to self-measure pulse accurately, and to acknowledge the importance of the exercise prescription.  Exercise Prescription Goal: Starting with aerobic activity 30 plus minutes a day, 3 days per week for initial exercise prescription. Provide home exercise prescription and guidelines that participant acknowledges understanding prior to discharge.  Activity Barriers & Risk Stratification:     Activity Barriers & Cardiac Risk Stratification - 02/27/16 1342      Activity Barriers & Cardiac Risk Stratification   Activity Barriers None      6 Minute Walk:     6 Minute Walk    Row Name 02/27/16 1623 04/17/16 1018       6 Minute Walk   Phase Initial Discharge    Distance 1530 feet 1700 feet    Distance % Change  - 11.1 %  170 ft    Walk Time 6 minutes 6  minutes    # of Rest Breaks 0 0    MPH 2.9 3.22    METS 3.67 3.88    RPE 10 12    VO2 Peak 12.8 13.58    Symptoms No Yes (comment)    Comments  - right leg/hip pain 6/10    Resting HR 7 bpm 66 bpm    Resting BP 126/54 116/60    Max Ex. HR 113 bpm 98 bpm    Max Ex. BP 134/70 144/74    2 Minute Post BP 126/64  -       Oxygen Initial Assessment:   Oxygen Re-Evaluation:   Oxygen Discharge (Final Oxygen Re-Evaluation):   Initial Exercise Prescription:     Initial Exercise Prescription - 02/27/16 1600      Date of Initial Exercise RX and Referring Provider   Date 02/27/16   Referring Provider Bartholome Bill MD     Treadmill   MPH 2.6   Grade 0.5   Minutes 15    METs 3.17     NuStep   Level 3   Minutes 15   METs 2     Recumbant Elliptical   Level 2   RPM 50   Minutes 15   METs 2     Prescription Details   Frequency (times per week) 3   Duration Progress to 45 minutes of aerobic exercise without signs/symptoms of physical distress     Intensity   THRR 40-80% of Max Heartrate 100-137   Ratings of Perceived Exertion 11-15   Perceived Dyspnea 0-4     Progression   Progression Continue to progress workloads to maintain intensity without signs/symptoms of physical distress.     Resistance Training   Training Prescription Yes   Weight 3 lbs   Reps 10-15      Perform Capillary Blood Glucose checks as needed.  Exercise Prescription Changes:     Exercise Prescription Changes    Row Name 02/27/16 1500 03/06/16 1300 03/14/16 0800 03/21/16 1400 04/03/16 1600     Response to Exercise   Blood Pressure (Admit) 126/54 124/70  - 112/60 108/62   Blood Pressure (Exercise) 134/70 126/70  - 142/78 126/62   Blood Pressure (Exit) 126/64 120/60  - 118/72 132/60   Heart Rate (Admit) 64 bpm 83 bpm  - 71 bpm 73 bpm   Heart Rate (Exercise) 113 bpm 127 bpm  - 117 bpm 113 bpm   Heart Rate (Exit) 69 bpm 70 bpm  - 64 bpm 80 bpm   Oxygen Saturation (Admit) 98 %  -  -  -  -   Oxygen Saturation (Exercise) 100 %  -  -  -  -   Rating of Perceived Exertion (Exercise) 10 14  - 13 13   Symptoms  - none  - none none   Comments  -  -  - Home Exercise Guidelines given 03/14/16 Home Exercise Guidelines given 03/14/16   Duration  - Progress to 45 minutes of aerobic exercise without signs/symptoms of physical distress  - Progress to 45 minutes of aerobic exercise without signs/symptoms of physical distress Progress to 45 minutes of aerobic exercise without signs/symptoms of physical distress   Intensity  - THRR unchanged  - THRR unchanged THRR unchanged     Progression   Progression  - Continue to progress workloads to maintain intensity without signs/symptoms of  physical distress.  - Continue to progress workloads to maintain intensity without signs/symptoms of physical distress. Continue to progress  workloads to maintain intensity without signs/symptoms of physical distress.   Average METs  - 2.84  - 3.08 3.38     Resistance Training   Training Prescription  - Yes  - Yes Yes   Weight  - 3 lbs  - 3 lbs 3 lbs   Reps  - 10-15  - 10-15 10-15     Interval Training   Interval Training  - No  - No No     Treadmill   MPH  - 2.6  - 2.6 2.6   Grade  - 0.5  - 1 1   Minutes  - 15  - 15 15   METs  - 3.17  - 3.35 3.35     NuStep   Level  - 3  - 4 4   Minutes  - 15  - 15 15   METs  - 2.5  - 3.5 4     Recumbant Elliptical   Level  -  -  - 2 2   RPM  -  -  - 50  -   Minutes  -  -  - 15 15   METs  -  -  - 2.4 2.8     Home Exercise Plan   Plans to continue exercise at  -  - [employer] [facility] [employer] (comment)  [place] (comment)  [place] (comment)  [facility]   Frequency  -  - Add 2 additional days to program exercise sessions. Add 2 additional days to program exercise sessions. Add 2 additional days to program exercise sessions.     Exercise Review   Progression -  walk test results -  First Full Day of Exercise  - Yes Yes   Row Name 04/18/16 1400 05/02/16 1000           Response to Exercise   Blood Pressure (Admit) 116/60 126/64      Blood Pressure (Exercise) 144/74 126/64      Blood Pressure (Exit) 124/62 118/62      Heart Rate (Admit) 66 bpm 86 bpm      Heart Rate (Exercise) 124 bpm 104 bpm      Heart Rate (Exit) 76 bpm 66 bpm      Rating of Perceived Exertion (Exercise) 13 12      Symptoms hip/knee pain hip/knee pain      Duration Continue with 45 min of aerobic exercise without signs/symptoms of physical distress. Continue with 45 min of aerobic exercise without signs/symptoms of physical distress.      Intensity THRR unchanged THRR unchanged         Progression   Progression Continue to progress workloads to maintain intensity without signs/symptoms of physical distress. Continue to progress workloads to maintain intensity without signs/symptoms of physical distress.      Average METs 3.83 3.65        Resistance Training   Training Prescription Yes Yes      Weight 5 lbs 5 lbs      Reps 10-15 10-15        Interval Training   Interval Training No Yes      Equipment  - NuStep      Comments  - 1mn 30 sec        Treadmill   MPH 2.6 2.6      Grade 1 1      Minutes 15 15      METs 3.35 3.35  NuStep   Level 5 5      Minutes 15 15      METs 4.3 5.4        Recumbant Elliptical   Level 2 3      Minutes 15 15      METs  - 2.5        Home Exercise Plan   Plans to continue exercise at [employer] (comment)  [place] (comment)  [facility]      Frequency Add 2 additional days to program exercise sessions. Add 2 additional days to program exercise sessions.      Initial Home Exercises Provided 03/14/16 03/14/16         Exercise Comments:     Exercise Comments    Row Name 03/06/16 1029 03/14/16 0854 03/21/16 1413 04/03/16 1606 04/11/16 0926   Exercise Comments  First full day of exercise!  Patient was oriented to gym and equipment including functions, settings, policies, and procedures.  Patient's individual exercise prescription and treatment plan were reviewed.  All starting workloads were established based on the results of the 6 minute walk test done at initial orientation visit.  The plan for exercise progression was also introduced and progression will be customized based on patient's performance and goals. Annya plans to walk and do yoga at a community center.  She is also seeing PT for hip/knee pain. Miel continues to have pain in her leg and hip.  She has been doing well with rehab though.  Today she did not stay for exercise but hopes to return  tomorrow. Iridian has been doing well in rehab.  She is up to 4.0 METs on the NuStep.  We will continue to monitor her progression. Reviewed METs average and discussed progression with pt today.   [place] Name 04/12/16 1041 04/19/16 0951 04/25/16 0855       Exercise Comments MET levels reviewed today. Reviewed METs average and discussed progression with pt today.  Keilly has knee and hip problems and today she had to stop for increased pain, resting and changing equipment reduced the pain and she was able to continue exercise. She has an April 4th appointment with an ortho MD        Exercise Goals and Review:   Exercise Goals Re-Evaluation :     Exercise Goals Re-Evaluation    [place] Name 04/17/16 1019 04/18/16 1413 05/02/16 1007         Exercise Goal Re-Evaluation   Exercise Goals Review Increase Physical Activity;Increase Strenth and Stamina Increase Physical Activity;Increase Strenth and Stamina Increase Physical Activity;Increase Strenth and Stamina     Comments Elyn improved her walk test by 1700 ft!! Lyzbeth continues to do well in rehab.  She was able to do well on her walk test, despite the ongoing pain in her hip/knee pain.  She is now up to level 5 on the NuStep.  She will be graduating soon!  We will continue to monitor her progression Piper will be graduating at her next visit.  She has done well and will continue to exercise by walking and doing yoga.     Expected Outcomes  - Short: Anisah will be graduating from Cardiac Rehab.  Long: Deyanna will continue to exercise independently and continue to work on building her strength and stamina.  Chrislynn will continue to exercise independently and continue to work on building her strength and stamina.        Discharge Exercise Prescription (Final Exercise  Prescription Changes):     Exercise Prescription Changes - 05/02/16 1000      Response to Exercise   Blood Pressure (Admit) 126/64   Blood Pressure (Exercise) 126/64   Blood Pressure (Exit) 118/62    Heart Rate (Admit) 86 bpm   Heart Rate (Exercise) 104 bpm   Heart Rate (Exit) 66 bpm   Rating of Perceived Exertion (Exercise) 12   Symptoms hip/knee pain   Duration Continue with 45 min of aerobic exercise without signs/symptoms of physical distress.   Intensity THRR unchanged     Progression   Progression Continue to progress workloads to maintain intensity without signs/symptoms of physical distress.   Average METs 3.65     Resistance Training   Training Prescription Yes   Weight 5 lbs   Reps 10-15     Interval Training   Interval Training Yes   Equipment NuStep   Comments 72mn 30 sec     Treadmill   MPH 2.6   Grade 1   Minutes 15   METs 3.35     NuStep   Level 5   Minutes 15   METs 5.4     Recumbant Elliptical   Level 3   Minutes 15   METs 2.5     Home Exercise Plan   [facility]  Frequency Add 2 additional days to program exercise sessions.   Initial Home Exercises Provided 03/14/16      Nutrition:  Target Goals: Understanding of nutrition guidelines, daily intake of sodium <15043m cholesterol <20059mcalories 30% from fat and 7% or less from saturated fats, daily to have 5 or more servings of fruits and vegetables.  Biometrics:     Pre Biometrics - 02/27/16 1624      Pre Biometrics   Height 5' 3.2" (1.605 m)   Weight 154 lb 8 oz (70.1 kg)   Waist Circumference 33 inches   Hip Circumference 40 inches   Waist to Hip Ratio 0.82 %   BMI (Calculated) 27.3   Single Leg Stand 28.51 seconds         Post Biometrics - 04/17/16 1019       Post  Biometrics   Height 5' 3.2" (1.605 m)   Weight 155 lb (70.3 kg)   Waist Circumference 32 inches   Hip Circumference 40 inches   Waist to Hip Ratio 0.8 %   BMI (Calculated) 27.3   Single Leg Stand 15.91 seconds      Nutrition Therapy Plan and Nutrition Goals:     Nutrition Therapy & Goals - 02/27/16 1332       Intervention Plan   Intervention Prescribe, educate and counsel regarding individualized specific dietary modifications aiming towards targeted core components such as weight, hypertension, lipid management, diabetes, heart failure and other comorbidities.   Expected Outcomes Short Term Goal: Understand basic principles of dietary content, such as calories, fat, sodium, cholesterol and nutrients.;Short Term Goal: A plan has been developed with personal nutrition goals set during dietitian appointment.;Long Term Goal: Adherence to prescribed nutrition plan.      Nutrition Discharge: Rate Your Plate Scores:     Nutrition Assessments - 04/10/16 1536      Rate Your Plate Scores   Post Score 78   Post Score % 86.7 %     MEDFICTS Scores   Pre Score 28   Post Score 24   Score Difference -4      Nutrition  Goals Re-Evaluation:     Nutrition Goals Re-Evaluation    [place] Name 03/20/16 [phone] 04/25/16 0849           Goals   Nutrition Goal  - decrease butter -- try Smart Balance spread      Comment Kathleene is going to [employer] this week she said. I also made her an appt with the Cardiac Rehab Registered dietician. All goals reviewed: Cortney is working on her eating habits, using goals with the program and [employer]. She has lost 7 pounds since starting weight watchers. She has made the suggested changes and has seen weight decrease with the changes      Expected Outcome  - Continue to use her goals to work towards weight loss and maintenance of healthy nutrition plan        Personal Goal #1 Re-Evaluation   Goal Progress Seen Yes  -        Personal Goal #2 Re-Evaluation   Personal Goal #2  - Be aware of salt content in foods (advised goal of 521m average per meal)        Personal Goal #3 Re-Evaluation   Personal Goal #3  - control frequency of shrimp intake        Personal Goal #4 Re-Evaluation   Personal Goal #4  - use plate method to control portions of starches and meats, and  have generous veg and fruit portions         Nutrition Goals Discharge (Final Nutrition Goals Re-Evaluation):     Nutrition Goals Re-Evaluation - 04/25/16 0849      Goals   Nutrition Goal decrease butter -- try Smart Balance spread   Comment All goals reviewed: DMeriamis working on her eating habits, using goals with the program and [employer]. She has lost 7 pounds since starting weight watchers. She has made the suggested changes and has seen weight decrease with the changes   Expected Outcome Continue to use her goals to work towards weight loss and maintenance of healthy nutrition plan     Personal Goal #2 Re-Evaluation   Personal Goal #2 Be aware of salt content in foods (advised goal of 5057maverage per meal)     Personal Goal #3 Re-Evaluation   Personal Goal #3 control frequency of shrimp intake     Personal Goal #4 Re-Evaluation   Personal Goal #4 use plate method to control portions of starches and meats, and have generous veg and fruit portions      Psychosocial: Target Goals: Acknowledge presence or absence of significant depression and/or stress, maximize coping skills, provide positive support system. Participant is able to verbalize types and ability to use techniques and skills needed for reducing stress and depression.   Initial Review & Psychosocial Screening:     Initial Psych Review & Screening - 02/27/16 1344      Initial Review   Current issues with Current Sleep Concerns  unable to get to sleep.   Sleep pattern to bed at 2 am sleeps until 10 am      Quality of Life Scores:      Quality of Life - 04/10/16 1538      Quality of Life Scores   Health/Function Pre 19.33 %   Health/Function Post 21.77 %   Health/Function % Change 12.62 %   Socioeconomic Pre 24 %   Socioeconomic Post 24 %   Socioeconomic % Change  0 %   Psych/Spiritual Pre 30 %   Psych/Spiritual  Post 22.57 %   Psych/Spiritual % Change -24.77 %   Family Pre 28.8 %   Family Post  24 %   Family % Change -16.67 %   GLOBAL Pre 24 %   GLOBAL Post 22.72 %   GLOBAL % Change -5.33 %      PHQ-9: Recent Review Flowsheet Data    Depression screen [facility] 2/9 04/10/2016 02/27/2016   Decreased Interest 1 2   Down, Depressed, Hopeless 1 0   PHQ - 2 Score 2 2   Altered sleeping 1 3   Tired, decreased energy 1 1   Change in appetite 0 2   Feeling bad or failure about yourself  0 1   Trouble concentrating 0 2   Moving slowly or fidgety/restless 0 0   Suicidal thoughts 0 0   PHQ-9 Score 4 11   Difficult doing work/chores Not difficult at all Somewhat difficult     Interpretation of Total Score  Total Score Depression Severity:  1-4 = Minimal depression, 5-9 = Mild depression, 10-14 = Moderate depression, 15-19 = Moderately severe depression, 20-27 = Severe depression   Psychosocial Evaluation and Intervention:     Psychosocial Evaluation - 04/12/16 0950      Discharge Psychosocial Assessment & Intervention   Comments Counselor met with Keyanna today for discharge summary.  She reports positive benefits of increased energy; more positive mood; and losing weight since coming into this program.  She had some initial benefit with her knee; but it has gotten worse lately and plans to see an orthopaedist in April about this.  Alisan states she is sleeping better and going to bed earlier.  She continues to have stress in her marriage which has impacted her Quality of Life Scores.  Counselor encouraged her to go to a counselor to learn some coping strategies as well as assertiveness skills.  Counselor provided her with contact information for a good local counslelor to help with this.  Zaraya also is struggling with retirement and what to do with her life/time.  She plans to keep exercise and may volunteer at a [employer] classes as she did earlier and was energizing for her.  Counselor encouraged her to do so as well, and commended her on the progress made while here.         Psychosocial Re-Evaluation:     Psychosocial Re-Evaluation    [place] Name 03/20/16 [phone] 03/29/16 0940 04/25/16 0837         Psychosocial Re-Evaluation   Current issues with  -  - Current Stress Concerns;Current Anxiety/Panic     Comments Desirey said this is helping her and glad to attend. Counselor follow up with Ms. Laury [facility]) reporting she is experiencing positive benefits from this program with sleeping better and her strength and energy has increased during the day.  She plans to see an orthopaedist in April about her knee to hopefully get some help with that problem.  She remains on her current anxiety medications and states they are working to treat her current symptoms.  Staff will continue to follow with Zaara. Spoke with [employer] today. She continues to use her anxiety meds as needed. Stated she still has days where she does not feel ready to go to her art classes. She is dealing with a knee and hip problem that inhibits her from participating in some activities. She has appointment with ortho MD in early April, with hope the ortho concerns will be fixed and allow her  tpo be able to move without pain daily.  She has not called a counselor, and stated she will if she notices increased depression symptoms: not participating in activities, not sleeping well.      Expected Outcomes  -  - Artia will seek professional help if symptoms continue. She will have appointment with ortho MD to help her with the knww and hip concerns.      Interventions Encouraged to attend Cardiac Rehabilitation for the exercise  - Encouraged to attend Cardiac Rehabilitation for the exercise     Continue Psychosocial Services   -  - Follow up required by staff        Psychosocial Discharge (Final Psychosocial Re-Evaluation):     Psychosocial Re-Evaluation - 04/25/16 0837      Psychosocial Re-Evaluation   Current issues with Current Stress Concerns;Current Anxiety/Panic   Comments Spoke with Tameisha today. She  continues to use her anxiety meds as needed. Stated she still has days where she does not feel ready to go to her art classes. She is dealing with a knee and hip problem that inhibits her from participating in some activities. She has appointment with ortho MD in early April, with hope the ortho concerns will be fixed and allow her tpo be able to move without pain daily.  She has not called a counselor, and stated she will if she notices increased depression symptoms: not participating in activities, not sleeping well.    Expected Outcomes Avila will seek professional help if symptoms continue. She will have appointment with ortho MD to help her with the knww and hip concerns.    Interventions Encouraged to attend Cardiac Rehabilitation for the exercise   Continue Psychosocial Services  Follow up required by staff      Vocational Rehabilitation: Provide vocational rehab assistance to qualifying candidates.   Vocational Rehab Evaluation & Intervention:     Vocational Rehab - 02/27/16 1343      Initial Vocational Rehab Evaluation & Intervention   Assessment shows need for Vocational Rehabilitation No      Education: Education Goals: Education classes will be provided on a weekly basis, covering required topics. Participant will state understanding/return demonstration of topics presented.  Learning Barriers/Preferences:     Learning Barriers/Preferences - 02/27/16 1342      Learning Barriers/Preferences   Learning Barriers --  Attention deficit   Learning Preferences Pictoral;Skilled Demonstration;Written Material;None      Education Topics: General Nutrition Guidelines/Fats and Fiber: -Group instruction provided by verbal, written material, models and posters to present the general guidelines for heart healthy nutrition. Gives an explanation and review of dietary fats and fiber.   Cardiac Rehab from 05/01/2016 in [facility] Cardiac and Pulmonary Rehab  Date  04/17/16  Educator  CR   Instruction Review Code  2- meets goals/outcomes      Controlling Sodium/Reading Food Labels: -Group verbal and written material supporting the discussion of sodium use in heart healthy nutrition. Review and explanation with models, verbal and written materials for utilization of the food label.   Exercise Physiology & Risk Factors: - Group verbal and written instruction with models to review the exercise physiology of the cardiovascular system and associated critical values. Details cardiovascular disease risk factors and the goals associated with each risk factor.   Cardiac Rehab from 05/01/2016 in [facility] Cardiac and Pulmonary Rehab  Date  05/01/16  Educator  [facility]  Instruction Review Code  2- meets goals/outcomes      Aerobic Exercise & Resistance Training: -  Gives group verbal and written discussion on the health impact of inactivity. On the components of aerobic and resistive training programs and the benefits of this training and how to safely progress through these programs.   Cardiac Rehab from 05/01/2016 in [facility] Cardiac and Pulmonary Rehab  Date  03/07/16  Educator  [facility]  Instruction Review Code  2- meets goals/outcomes      Flexibility, Balance, General Exercise Guidelines: - Provides group verbal and written instruction on the benefits of flexibility and balance training programs. Provides general exercise guidelines with specific guidelines to those with heart or lung disease. Demonstration and skill practice provided.   Cardiac Rehab from 05/01/2016 in [facility] Cardiac and Pulmonary Rehab  Date  03/13/16  Educator  [facility]  Instruction Review Code  2- meets goals/outcomes      Stress Management: - Provides group verbal and written instruction about the health risks of elevated stress, cause of high stress, and healthy ways to reduce stress.   Cardiac Rehab from 05/01/2016 in [facility] Cardiac and Pulmonary Rehab  Date  03/22/16  Educator  TS  Instruction Review Code  2- meets  goals/outcomes      Depression: - Provides group verbal and written instruction on the correlation between heart/lung disease and depressed mood, treatment options, and the stigmas associated with seeking treatment.   Anatomy & Physiology of the Heart: - Group verbal and written instruction and models provide basic cardiac anatomy and physiology, with the coronary electrical and arterial systems. Review of: AMI, Angina, Valve disease, Heart Failure, Cardiac Arrhythmia, Pacemakers, and the ICD.   Cardiac Rehab from 05/01/2016 in [facility] Cardiac and Pulmonary Rehab  Date  03/20/16  Educator  CE  Instruction Review Code  2- meets goals/outcomes      Cardiac Procedures: - Group verbal and written instruction and models to describe the testing methods done to diagnose heart disease. Reviews the outcomes of the test results. Describes the treatment choices: Medical Management, Angioplasty, or Coronary Bypass Surgery.   Cardiac Rehab from 05/01/2016 in [facility] Cardiac and Pulmonary Rehab  Date  03/27/16  Educator  CE  Instruction Review Code  2- meets goals/outcomes      Cardiac Medications: - Group verbal and written instruction to review commonly prescribed medications for heart disease. Reviews the medication, class of the drug, and side effects. Includes the steps to properly store meds and maintain the prescription regimen.   Cardiac Rehab from 05/01/2016 in [facility] Cardiac and Pulmonary Rehab  Date  04/03/16 [04/04/16 Second Part CE]  Educator  SB  Instruction Review Code  2- meets goals/outcomes      Go Sex-Intimacy & Heart Disease, Get SMART - Goal Setting: - Group verbal and written instruction through game format to discuss heart disease and the return to sexual intimacy. Provides group verbal and written material to discuss and apply goal setting through the application of the S.M.A.R.T. Method.   Cardiac Rehab from 05/01/2016 in [facility] Cardiac and Pulmonary Rehab  Date  03/27/16  Educator   CE  Instruction Review Code  2- meets goals/outcomes      Other Matters of the Heart: - Provides group verbal, written materials and models to describe Heart Failure, Angina, Valve Disease, and Diabetes in the realm of heart disease. Includes description of the disease process and treatment options available to the cardiac patient.   Cardiac Rehab from 05/01/2016 in [facility] Cardiac and Pulmonary Rehab  Date  03/20/16  Educator  CE  Instruction Review Code  2- meets  goals/outcomes      Exercise & Equipment Safety: - Individual verbal instruction and demonstration of equipment use and safety with use of the equipment.   Cardiac Rehab from 05/01/2016 in [facility] Cardiac and Pulmonary Rehab  Date  02/27/16  Educator  SB  Instruction Review Code  2- meets goals/outcomes      Infection Prevention: - Provides verbal and written material to individual with discussion of infection control including proper hand washing and proper equipment cleaning during exercise session.   Cardiac Rehab from 05/01/2016 in [facility] Cardiac and Pulmonary Rehab  Date  02/27/16  Educator  SB  Instruction Review Code  2- meets goals/outcomes      Falls Prevention: - Provides verbal and written material to individual with discussion of falls prevention and safety.   Cardiac Rehab from 05/01/2016 in [facility] Cardiac and Pulmonary Rehab  Date  02/27/16  Educator  Sb  Instruction Review Code  2- meets goals/outcomes      Diabetes: - Individual verbal and written instruction to review signs/symptoms of diabetes, desired ranges of glucose level fasting, after meals and with exercise. Advice that pre and post exercise glucose checks will be done for 3 sessions at entry of program.    Knowledge Questionnaire Score:     Knowledge Questionnaire Score - 04/10/16 1538      Knowledge Questionnaire Score   Pre Score 20/28   Post Score 24/28      Core Components/Risk Factors/Patient Goals at Admission:     Personal  Goals and Risk Factors at Admission - 02/27/16 1332      Core Components/Risk Factors/Patient Goals on Admission    Weight Management Obesity;Weight Loss;Yes   Intervention Weight Management: Develop a combined nutrition and exercise program designed to reach desired caloric intake, while maintaining appropriate intake of nutrient and fiber, sodium and fats, and appropriate energy expenditure required for the weight goal.;Weight Management: Provide education and appropriate resources to help participant work on and attain dietary goals.;Weight Management/Obesity: Establish reasonable short term and long term weight goals.;Obesity: Provide education and appropriate resources to help participant work on and attain dietary goals.   Admit Weight 154 lb 1.6 oz (69.9 kg)   Goal Weight: Short Term 150 lb (68 kg)   Goal Weight: Long Term 130 lb (59 kg)   Expected Outcomes Short Term: Continue to assess and modify interventions until short term weight is achieved;Long Term: Adherence to nutrition and physical activity/exercise program aimed toward attainment of established weight goal;Weight Loss: Understanding of general recommendations for a balanced deficit meal plan, which promotes 1-2 lb weight loss per week and includes a negative energy balance of [phone] kcal/d   Sedentary Yes   Intervention Provide advice, education, support and counseling about physical activity/exercise needs.;Develop an individualized exercise prescription for aerobic and resistive training based on initial evaluation findings, risk stratification, comorbidities and participant's personal goals.   Expected Outcomes Achievement of increased cardiorespiratory fitness and enhanced flexibility, muscular endurance and strength shown through measurements of functional capacity and personal statement of participant.   Increase Strength and Stamina Yes   Intervention Provide advice, education, support and counseling about physical  activity/exercise needs.;Develop an individualized exercise prescription for aerobic and resistive training based on initial evaluation findings, risk stratification, comorbidities and participant's personal goals.   Expected Outcomes Achievement of increased cardiorespiratory fitness and enhanced flexibility, muscular endurance and strength shown through measurements of functional capacity and personal statement of participant.   Hypertension Yes   Intervention Provide education on lifestyle  modifcations including regular physical activity/exercise, weight management, moderate sodium restriction and increased consumption of fresh fruit, vegetables, and low fat dairy, alcohol moderation, and smoking cessation.;Monitor prescription use compliance.   Expected Outcomes Short Term: Continued assessment and intervention until BP is < 140/75m HG in hypertensive participants. < 130/819mHG in hypertensive participants with diabetes, heart failure or chronic kidney disease.;Long Term: Maintenance of blood pressure at goal levels.   Lipids Yes   Intervention Provide education and support for participant on nutrition & aerobic/resistive exercise along with prescribed medications to achieve LDL <7033mHDL >57m48m Expected Outcomes Short Term: Participant states understanding of desired cholesterol values and is compliant with medications prescribed. Participant is following exercise prescription and nutrition guidelines.;Long Term: Cholesterol controlled with medications as prescribed, with individualized exercise RX and with personalized nutrition plan. Value goals: LDL < 70mg101mL > 40 mg.   Stress Yes  Current stress is being resolved soon.  Has non-family person living with her and it has become stressful   Intervention Offer individual and/or small group education and counseling on adjustment to heart disease, stress management and health-related lifestyle change. Teach and support self-help strategies.;Refer  participants experiencing significant psychosocial distress to appropriate mental health specialists for further evaluation and treatment. When possible, include family members and significant others in education/counseling sessions.   Expected Outcomes Short Term: Participant demonstrates changes in health-related behavior, relaxation and other stress management skills, ability to obtain effective social support, and compliance with psychotropic medications if prescribed.;Long Term: Emotional wellbeing is indicated by absence of clinically significant psychosocial distress or social isolation.      Core Components/Risk Factors/Patient Goals Review:      Goals and Risk Factor Review    Row Name 03/20/16 0943 [phone] 04/27/16 1051         Core Components/Risk Factors/Patient Goals Review   Personal Goals Review Weight Management/Obesity;Hypertension;Lipids Weight Management/Obesity;Hypertension;Lipids;Stress  -     Review WEight still 154lbs. She is going to [employer]. Arieana's MD changed her from Lisinopril to Metoprolol. She feels her cholestrol medicine is making her leg sore. She has a call into her MD already.  Weight is down 7 pounds since she started Weight [employer], is following nutrition plan and advice from program RD alonf with her weigth watchers plan. She is compliant with her meds and is doinf well with BP. She is taking a new cholesterol med and so far has not had symptoms with htis new med. Stress continues, she takes meds as needed and she has name of a counselor to call when she feels she may need talk therapy.  Still doing Weight Watchers. Jennavieve wants and hopes this 3rd knee doctor will help her (appt is April 4th). I suggested the option of going ahead and doing at least the evaluation for physical therapy since that should help her constant knee pain. Atonya reported that she has met indiviudally with the Cardiac Rehab REgistered Dietician and that she has already cut back  on Saturated Fats and sodium.      Expected Outcomes Heart healthy lifestyle Continue with goals to maintian heart healthy lifestyle. Follow up with counselor if needed Heart healthy lifestyle        Core Components/Risk Factors/Patient Goals at Discharge (Final Review):      Goals and Risk Factor Review - 04/27/16 1051      Core Components/Risk Factors/Patient Goals Review   Review Still doing Weight Watchers. Paulyne wants and hopes this 3rd knee doctor will help  her (appt is April 4th). I suggested the option of going ahead and doing at least the evaluation for physical therapy since that should help her constant knee pain. Zeppelin reported that she has met indiviudally with the Cardiac Rehab REgistered Dietician and that she has already cut back on Saturated Fats and sodium.    Expected Outcomes Heart healthy lifestyle      ITP Comments:     ITP Comments    Row Name 02/27/16 1328 03/14/16 0555 03/14/16 0611 03/20/16 0934 03/21/16 0859   ITP Comments Medical Review completed. Initial ITP created.  Diagnosis documentation can be found in CARE EVERYWHERE 01/03/2016 admission [facility] 30 day review. Continue with ITP unless directed changes per Medical Director review. 30 day review. Continue with ITP unless directed changes per Medical Director review. Suezette said she is having right leg pain she thinks due to her cholestrol medicine. I gave her a stretching exercise to see if that helps too. Bristal came in today for exercise.  She was limping more than normal.  She decided to go see Dr. Ubaldo Glassing instead of exercising today to see if he could reduce her medication.   [place] Name 03/21/16 1451 04/11/16 0556 05/09/16 0538       ITP Comments Called to check on pt.  She was told that she could hold her statin for a week to see if she has any pain relief.  She hopes to be here tomorrow. 30 day review. Continue with ITP unless directed changes per Medical Director review 30 day review. Continue  with ITP unless directed changes per Medical Director review        Comments:

## 2016-05-10 DIAGNOSIS — I213 ST elevation (STEMI) myocardial infarction of unspecified site: Secondary | ICD-10-CM

## 2016-05-10 DIAGNOSIS — Z48812 Encounter for surgical aftercare following surgery on the circulatory system: Secondary | ICD-10-CM | POA: Diagnosis not present

## 2016-05-10 DIAGNOSIS — Z9861 Coronary angioplasty status: Secondary | ICD-10-CM

## 2016-05-10 NOTE — Progress Notes (Signed)
Daily Session Note  Patient Details  Name: [name] MRN: [id number] Date of Birth: Jun 17, 1950 Referring Provider:     Cardiac Rehab from 02/27/2016 in [facility] Cardiac and Pulmonary Rehab  Referring Provider  Bartholome Bill MD      Encounter Date: 05/10/2016  Check In:     Session Check In - 05/10/16 0907      Check-In   Location [facility]-Cardiac & Pulmonary Rehab   Staff Present Kristina Cowden, RN, BSN, [place]lette Pa, MA, ACSM RCEP, Exercise Physiologist;[name] [name], [place], ACSM CEP, Exercise Physiologist   Supervising physician immediately available to respond to emergencies See telemetry face sheet for immediately available ER MD   Medication changes reported     No   Fall or balance concerns reported    No   Resistance Training Performed Yes   VAD Patient? No     Pain Assessment   Currently in Pain? No/denies         History  Smoking Status  . Former Smoker  . Quit date: 02/13/1983  Smokeless Tobacco  . Not on file    Goals Met:  Independence with exercise equipment Exercise tolerated well No report of cardiac concerns or symptoms Strength training completed today  Goals Unmet:  Not Applicable  Comments:  [name] graduated today from cardiac rehab with 36 sessions completed.  Details of the patient's exercise prescription and what she needs to do in order to continue the prescription and progress were discussed with patient.  Patient was given a copy of prescription and goals.  Patient verbalized understanding.  [name] plans to continue to exercise by going to senior center in [place].    Dr. Emily Tucker is Medical Director for [place] [facility].

## 2016-05-10 NOTE — Progress Notes (Signed)
Cardiac Individual Treatment Plan  Patient Details  Name: Maisha Bogen MRN: [id number] Date of Birth: 1950-10-12 Referring Provider:     Cardiac Rehab from 02/27/2016 in [facility] Cardiac and Pulmonary Rehab  Referring Provider  Bartholome Bill MD      Initial Encounter Date:    Cardiac Rehab from 02/27/2016 in [facility] Cardiac and Pulmonary Rehab  Date  02/27/16  Referring Provider  Bartholome Bill MD      Visit Diagnosis: ST elevation myocardial infarction (STEMI), unspecified artery ([place])  S/P PTCA (percutaneous transluminal coronary angioplasty)  Patient's Home Medications on Admission:  Current Outpatient Prescriptions:  .  amlodipine-benazepril (LOTREL) 2.5-10 MG per capsule, Take 1 capsule by mouth daily., Disp: , Rfl:  .  clopidogrel (PLAVIX) 75 MG tablet, Take 75 mg by mouth daily., Disp: , Rfl:  .  cyclobenzaprine (FLEXERIL) 10 MG tablet, Take 1 tablet (10 mg total) by mouth 3 (three) times daily as needed for muscle spasms., Disp: 30 tablet, Rfl: 0 .  EPINEPHrine 1 MG/ML SOLN, Inject as directed., Disp: , Rfl:  .  estradiol (ESTRACE) 0.1 MG/GM vaginal cream, Place 1 Applicatorful vaginally 3 (three) times a week., Disp: , Rfl:  .  hydroxychloroquine (PLAQUENIL) 200 MG tablet, Take by mouth daily., Disp: , Rfl:  .  METOPROLOL TARTRATE PO, Take 25 mg by mouth., Disp: , Rfl:  .  oxybutynin (DITROPAN) 5 MG tablet, Take 5 mg by mouth 3 (three) times daily., Disp: , Rfl:  .  traMADol (ULTRAM) 50 MG tablet, Take 1 tablet (50 mg total) by mouth every 6 (six) hours as needed., Disp: 9 tablet, Rfl: 0 .  venlafaxine (EFFEXOR) 75 MG tablet, Take 75 mg by mouth 2 (two) times daily., Disp: , Rfl:   Past Medical History: Past Medical History:  Diagnosis Date  . Anxiety   . Cystocele   . Hypertension   . PMB (postmenopausal bleeding)   . Rectocele   . SUI (stress urinary incontinence, female)   . Unstable bladder   . Urination frequency   . 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 Vaginal atrophy   . Vaginal itching     Tobacco Use: History  Smoking Status  . Former Smoker  . Quit date: 02/13/1983  Smokeless Tobacco  . Not on file    Labs: Recent Review Flowsheet Data    There is no flowsheet data to display.       Exercise Target Goals:    Exercise Program Goal: Individual exercise prescription set with THRR, safety & activity barriers. Participant demonstrates ability to understand and report RPE using BORG scale, to self-measure pulse accurately, and to acknowledge the importance of the exercise prescription.  Exercise Prescription Goal: Starting with aerobic activity 30 plus minutes a day, 3 days per week for initial exercise prescription. Provide home exercise prescription and guidelines that participant acknowledges understanding prior to discharge.  Activity Barriers & Risk Stratification:     Activity Barriers & Cardiac Risk Stratification - 02/27/16 1342      Activity Barriers & Cardiac Risk Stratification   Activity Barriers None      6 Minute Walk:     6 Minute Walk    Row Name 02/27/16 1623 04/17/16 1018       6 Minute Walk   Phase Initial Discharge    Distance 1530 feet 1700 feet    Distance % Change  - 11.1 %  170 ft    Walk Time 6 minutes 6  minutes    # of Rest Breaks 0 0    MPH 2.9 3.22    METS 3.67 3.88    RPE 10 12    VO2 Peak 12.8 13.58    Symptoms No Yes (comment)    Comments  - right leg/hip pain 6/10    Resting HR 7 bpm 66 bpm    Resting BP 126/54 116/60    Max Ex. HR 113 bpm 98 bpm    Max Ex. BP 134/70 144/74    2 Minute Post BP 126/64  -       Oxygen Initial Assessment:   Oxygen Re-Evaluation:   Oxygen Discharge (Final Oxygen Re-Evaluation):   Initial Exercise Prescription:     Initial Exercise Prescription - 02/27/16 1600      Date of Initial Exercise RX and Referring Provider   Date 02/27/16   Referring Provider Bartholome Bill MD     Treadmill   MPH 2.6   Grade 0.5   Minutes 15    METs 3.17     NuStep   Level 3   Minutes 15   METs 2     Recumbant Elliptical   Level 2   RPM 50   Minutes 15   METs 2     Prescription Details   Frequency (times per week) 3   Duration Progress to 45 minutes of aerobic exercise without signs/symptoms of physical distress     Intensity   THRR 40-80% of Max Heartrate 100-137   Ratings of Perceived Exertion 11-15   Perceived Dyspnea 0-4     Progression   Progression Continue to progress workloads to maintain intensity without signs/symptoms of physical distress.     Resistance Training   Training Prescription Yes   Weight 3 lbs   Reps 10-15      Perform Capillary Blood Glucose checks as needed.  Exercise Prescription Changes:     Exercise Prescription Changes    Row Name 02/27/16 1500 03/06/16 1300 03/14/16 0800 03/21/16 1400 04/03/16 1600     Response to Exercise   Blood Pressure (Admit) 126/54 124/70  - 112/60 108/62   Blood Pressure (Exercise) 134/70 126/70  - 142/78 126/62   Blood Pressure (Exit) 126/64 120/60  - 118/72 132/60   Heart Rate (Admit) 64 bpm 83 bpm  - 71 bpm 73 bpm   Heart Rate (Exercise) 113 bpm 127 bpm  - 117 bpm 113 bpm   Heart Rate (Exit) 69 bpm 70 bpm  - 64 bpm 80 bpm   Oxygen Saturation (Admit) 98 %  -  -  -  -   Oxygen Saturation (Exercise) 100 %  -  -  -  -   Rating of Perceived Exertion (Exercise) 10 14  - 13 13   Symptoms  - none  - none none   Comments  -  -  - Home Exercise Guidelines given 03/14/16 Home Exercise Guidelines given 03/14/16   Duration  - Progress to 45 minutes of aerobic exercise without signs/symptoms of physical distress  - Progress to 45 minutes of aerobic exercise without signs/symptoms of physical distress Progress to 45 minutes of aerobic exercise without signs/symptoms of physical distress   Intensity  - THRR unchanged  - THRR unchanged THRR unchanged     Progression   Progression  - Continue to progress workloads to maintain intensity without signs/symptoms of  physical distress.  - Continue to progress workloads to maintain intensity without signs/symptoms of physical distress. Continue to progress  workloads to maintain intensity without signs/symptoms of physical distress.   Average METs  - 2.84  - 3.08 3.38     Resistance Training   Training Prescription  - Yes  - Yes Yes   Weight  - 3 lbs  - 3 lbs 3 lbs   Reps  - 10-15  - 10-15 10-15     Interval Training   Interval Training  - No  - No No     Treadmill   MPH  - 2.6  - 2.6 2.6   Grade  - 0.5  - 1 1   Minutes  - 15  - 15 15   METs  - 3.17  - 3.35 3.35     NuStep   Level  - 3  - 4 4   Minutes  - 15  - 15 15   METs  - 2.5  - 3.5 4     Recumbant Elliptical   Level  -  -  - 2 2   RPM  -  -  - 50  -   Minutes  -  -  - 15 15   METs  -  -  - 2.4 2.8     Home Exercise Plan   Plans to continue exercise at  -  - [employer] (comment)  [place] (comment)  [place] (comment)  [facility]   Frequency  -  - Add 2 additional days to program exercise sessions. Add 2 additional days to program exercise sessions. Add 2 additional days to program exercise sessions.     Exercise Review   Progression -  walk test results -  First Full Day of Exercise  - Yes Yes   Row Name 04/18/16 1400 05/02/16 1000           Response to Exercise   Blood Pressure (Admit) 116/60 126/64      Blood Pressure (Exercise) 144/74 126/64      Blood Pressure (Exit) 124/62 118/62      Heart Rate (Admit) 66 bpm 86 bpm      Heart Rate (Exercise) 124 bpm 104 bpm      Heart Rate (Exit) 76 bpm 66 bpm      Rating of Perceived Exertion (Exercise) 13 12      Symptoms hip/knee pain hip/knee pain      Duration Continue with 45 min of aerobic exercise without signs/symptoms of physical distress. Continue with 45 min of aerobic exercise without signs/symptoms of physical distress.      Intensity THRR unchanged THRR unchanged         Progression   Progression Continue to progress workloads to maintain intensity without signs/symptoms of physical distress. Continue to progress workloads to maintain intensity without signs/symptoms of physical distress.      Average METs 3.83 3.65        Resistance Training   Training Prescription Yes Yes      Weight 5 lbs 5 lbs      Reps 10-15 10-15        Interval Training   Interval Training No Yes      Equipment  - NuStep      Comments  - 1mn 30 sec        Treadmill   MPH 2.6 2.6      Grade 1 1      Minutes 15 15      METs 3.35 3.35  NuStep   Level 5 5      Minutes 15 15      METs 4.3 5.4        Recumbant Elliptical   Level 2 3      Minutes 15 15      METs  - 2.5        Home Exercise Plan   Plans to continue exercise at [facility] (comment)  [place] (comment)  [facility]      Frequency Add 2 additional days to program exercise sessions. Add 2 additional days to program exercise sessions.      Initial Home Exercises Provided 03/14/16 03/14/16         Exercise Comments:     Exercise Comments    Row Name 03/06/16 1029 03/14/16 0854 03/21/16 1413 04/03/16 1606 04/11/16 0926   Exercise Comments  First full day of exercise!  Patient was oriented to gym and equipment including functions, settings, policies, and procedures.  Patient's individual exercise prescription and treatment plan were reviewed.  All starting workloads were established based on the results of the 6 minute walk test done at initial orientation visit.  The plan for exercise progression was also introduced and progression will be customized based on patient's performance and goals. Alexie plans to walk and do yoga at a community center.  She is also seeing PT for hip/knee pain. Ketrina continues to have pain in her leg and hip.  She has been doing well with rehab though.  Today she did not stay for exercise but hopes to return  tomorrow. Kaylana has been doing well in rehab.  She is up to 4.0 METs on the NuStep.  We will continue to monitor her progression. Reviewed METs average and discussed progression with pt today.   [place] Name 04/12/16 1041 04/19/16 0951 04/25/16 0855 05/10/16 0959     Exercise Comments MET levels reviewed today. Reviewed METs average and discussed progression with pt today.  Cohen has knee and hip problems and today she had to stop for increased pain, resting and changing equipment reduced the pain and she was able to continue exercise. She has an April 4th appointment with an ortho MD [place] graduated today from cardiac rehab with 36 sessions completed.  Details of the patient's exercise prescription and what she needs to do in order to continue the prescription and progress were discussed with patient.  Patient was given a copy of prescription and goals.  Patient verbalized understanding.  Rudy plans to continue to exercise by going to senior center in [place].       Exercise Goals and Review:   Exercise Goals Re-Evaluation :     Exercise Goals Re-Evaluation    Row Name 04/17/16 1019 04/18/16 1413 05/02/16 1007         Exercise Goal Re-Evaluation   Exercise Goals Review Increase Physical Activity;Increase Strenth and Stamina Increase Physical Activity;Increase Strenth and Stamina Increase Physical Activity;Increase Strenth and Stamina     Comments Dessire improved her walk test by 1700 ft!! Annlouise continues to do well in rehab.  She was able to do well on her walk test, despite the ongoing pain in her hip/knee pain.  She is now up to level 5 on the NuStep.  She will be graduating soon!  We will continue to monitor her progression Malijah will be graduating at her next visit.  She has done well and will continue to exercise by walking and doing yoga.  Expected Outcomes  - Short: Donis will be graduating from Cardiac Rehab.  Long: Leslyn will continue to exercise independently and continue to work on  building her strength and stamina.  Earleen will continue to exercise independently and continue to work on building her strength and stamina.        Discharge Exercise Prescription (Final Exercise Prescription Changes):     Exercise Prescription Changes - 05/02/16 1000      Response to Exercise   Blood Pressure (Admit) 126/64   Blood Pressure (Exercise) 126/64   Blood Pressure (Exit) 118/62   Heart Rate (Admit) 86 bpm   Heart Rate (Exercise) 104 bpm   Heart Rate (Exit) 66 bpm   Rating of Perceived Exertion (Exercise) 12   Symptoms hip/knee pain   Duration Continue with 45 min of aerobic exercise without signs/symptoms of physical distress.   Intensity THRR unchanged     Progression   Progression Continue to progress workloads to maintain intensity without signs/symptoms of physical distress.   Average METs 3.65     Resistance Training   Training Prescription Yes   Weight 5 lbs   Reps 10-15     Interval Training   Interval Training Yes   Equipment NuStep   Comments 14mn 30 sec     Treadmill   MPH 2.6   Grade 1   Minutes 15   METs 3.35     NuStep   Level 5   Minutes 15   METs 5.4     Recumbant Elliptical   Level 3   Minutes 15   METs 2.5     Home Exercise Plan   [facility]  Frequency Add 2 additional days to program exercise sessions.   Initial Home Exercises Provided 03/14/16      Nutrition:  Target Goals: Understanding of nutrition guidelines, daily intake of sodium '1500mg'$ , cholesterol '200mg'$ , calories 30% from fat and 7% or less from saturated fats, daily to have 5 or more servings of fruits and vegetables.  Biometrics:     Pre Biometrics - 02/27/16 1624      Pre Biometrics   Height 5' 3.2" (1.605 m)   Weight 154 lb 8 oz (70.1 kg)   Waist Circumference 33 inches   Hip Circumference 40 inches   Waist to Hip Ratio 0.82 %   BMI (Calculated) 27.3   Single Leg Stand  28.51 seconds         Post Biometrics - 04/17/16 1019       Post  Biometrics   Height 5' 3.2" (1.605 m)   Weight 155 lb (70.3 kg)   Waist Circumference 32 inches   Hip Circumference 40 inches   Waist to Hip Ratio 0.8 %   BMI (Calculated) 27.3   Single Leg Stand 15.91 seconds      Nutrition Therapy Plan and Nutrition Goals:     Nutrition Therapy & Goals - 02/27/16 1332      Intervention Plan   Intervention Prescribe, educate and counsel regarding individualized specific dietary modifications aiming towards targeted core components such as weight, hypertension, lipid management, diabetes, heart failure and other comorbidities.   Expected Outcomes Short Term Goal: Understand basic principles of dietary content, such as calories, fat, sodium, cholesterol and nutrients.;Short Term Goal: A plan has been developed with personal nutrition goals set during dietitian appointment.;Long Term Goal: Adherence to prescribed nutrition plan.      Nutrition Discharge: Rate Your  Plate Scores:     Nutrition Assessments - 04/10/16 1536      Rate Your Plate Scores   Post Score 78   Post Score % 86.7 %     MEDFICTS Scores   Pre Score 28   Post Score 24   Score Difference -4      Nutrition Goals Re-Evaluation:     Nutrition Goals Re-Evaluation    Row Name 03/20/16 [phone] 04/25/16 0849           Goals   Nutrition Goal  - decrease butter -- try Smart Balance spread      Comment Sherma is going to [employer] this week she said. I also made her an appt with the Cardiac Rehab Registered dietician. All goals reviewed: Viviana is working on her eating habits, using goals with the program and [employer]. She has lost 7 pounds since starting weight watchers. She has made the suggested changes and has seen weight decrease with the changes      Expected Outcome  - Continue to use her goals to work towards weight loss and maintenance of healthy nutrition plan        Personal Goal #1  Re-Evaluation   Goal Progress Seen Yes  -        Personal Goal #2 Re-Evaluation   Personal Goal #2  - Be aware of salt content in foods (advised goal of '500mg'$  average per meal)        Personal Goal #3 Re-Evaluation   Personal Goal #3  - control frequency of shrimp intake        Personal Goal #4 Re-Evaluation   Personal Goal #4  - use plate method to control portions of starches and meats, and have generous veg and fruit portions         Nutrition Goals Discharge (Final Nutrition Goals Re-Evaluation):     Nutrition Goals Re-Evaluation - 04/25/16 0849      Goals   Nutrition Goal decrease butter -- try Smart Balance spread   Comment All goals reviewed: Leya is working on her eating habits, using goals with the program and [employer]. She has lost 7 pounds since starting weight watchers. She has made the suggested changes and has seen weight decrease with the changes   Expected Outcome Continue to use her goals to work towards weight loss and maintenance of healthy nutrition plan     Personal Goal #2 Re-Evaluation   Personal Goal #2 Be aware of salt content in foods (advised goal of '500mg'$  average per meal)     Personal Goal #3 Re-Evaluation   Personal Goal #3 control frequency of shrimp intake     Personal Goal #4 Re-Evaluation   Personal Goal #4 use plate method to control portions of starches and meats, and have generous veg and fruit portions      Psychosocial: Target Goals: Acknowledge presence or absence of significant depression and/or stress, maximize coping skills, provide positive support system. Participant is able to verbalize types and ability to use techniques and skills needed for reducing stress and depression.   Initial Review & Psychosocial Screening:     Initial Psych Review & Screening - 02/27/16 1344      Initial Review   Current issues with Current Sleep Concerns  unable to get to sleep.   Sleep pattern to bed at 2 am sleeps until 10 am       Quality of Life Scores:      Quality of Life -  04/10/16 1538      Quality of Life Scores   Health/Function Pre 19.33 %   Health/Function Post 21.77 %   Health/Function % Change 12.62 %   Socioeconomic Pre 24 %   Socioeconomic Post 24 %   Socioeconomic % Change  0 %   Psych/Spiritual Pre 30 %   Psych/Spiritual Post 22.57 %   Psych/Spiritual % Change -24.77 %   Family Pre 28.8 %   Family Post 24 %   Family % Change -16.67 %   GLOBAL Pre 24 %   GLOBAL Post 22.72 %   GLOBAL % Change -5.33 %      PHQ-9: Recent Review Flowsheet Data    Depression screen [facility] 2/9 04/10/2016 02/27/2016   Decreased Interest 1 2   Down, Depressed, Hopeless 1 0   PHQ - 2 Score 2 2   Altered sleeping 1 3   Tired, decreased energy 1 1   Change in appetite 0 2   Feeling bad or failure about yourself  0 1   Trouble concentrating 0 2   Moving slowly or fidgety/restless 0 0   Suicidal thoughts 0 0   PHQ-9 Score 4 11   Difficult doing work/chores Not difficult at all Somewhat difficult     Interpretation of Total Score  Total Score Depression Severity:  1-4 = Minimal depression, 5-9 = Mild depression, 10-14 = Moderate depression, 15-19 = Moderately severe depression, 20-27 = Severe depression   Psychosocial Evaluation and Intervention:     Psychosocial Evaluation - 04/12/16 0950      Discharge Psychosocial Assessment & Intervention   Comments Counselor met with Quantasia today for discharge summary.  She reports positive benefits of increased energy; more positive mood; and losing weight since coming into this program.  She had some initial benefit with her knee; but it has gotten worse lately and plans to see an orthopaedist in April about this.  Lorynn states she is sleeping better and going to bed earlier.  She continues to have stress in her marriage which has impacted her Quality of Life Scores.  Counselor encouraged her to go to a counselor to learn some coping strategies as well as assertiveness  skills.  Counselor provided her with contact information for a good local counslelor to help with this.  Deana also is struggling with retirement and what to do with her life/time.  She plans to keep exercise and may volunteer at a [employer] classes as she did earlier and was energizing for her.  Counselor encouraged her to do so as well, and commended her on the progress made while here.        Psychosocial Re-Evaluation:     Psychosocial Re-Evaluation    Row Name 03/20/16 [phone] 03/29/16 0940 04/25/16 0837         Psychosocial Re-Evaluation   Current issues with  -  - Current Stress Concerns;Current Anxiety/Panic     Comments Reham said this is helping her and glad to attend. Counselor follow up with Ms. Farnam [facility]) reporting she is experiencing positive benefits from this program with sleeping better and her strength and energy has increased during the day.  She plans to see an orthopaedist in April about her knee to hopefully get some help with that problem.  She remains on her current anxiety medications and states they are working to treat her current symptoms.  Staff will continue to follow with Zan. Spoke with [employer] today. She continues to use her  anxiety meds as needed. Stated she still has days where she does not feel ready to go to her art classes. She is dealing with a knee and hip problem that inhibits her from participating in some activities. She has appointment with ortho MD in early April, with hope the ortho concerns will be fixed and allow her tpo be able to move without pain daily.  She has not called a counselor, and stated she will if she notices increased depression symptoms: not participating in activities, not sleeping well.      Expected Outcomes  -  - Marylee will seek professional help if symptoms continue. She will have appointment with ortho MD to help her with the knww and hip concerns.      Interventions Encouraged to attend Cardiac Rehabilitation for the  exercise  - Encouraged to attend Cardiac Rehabilitation for the exercise     Continue Psychosocial Services   -  - Follow up required by staff        Psychosocial Discharge (Final Psychosocial Re-Evaluation):     Psychosocial Re-Evaluation - 04/25/16 0837      Psychosocial Re-Evaluation   Current issues with Current Stress Concerns;Current Anxiety/Panic   Comments Spoke with Damonique today. She continues to use her anxiety meds as needed. Stated she still has days where she does not feel ready to go to her art classes. She is dealing with a knee and hip problem that inhibits her from participating in some activities. She has appointment with ortho MD in early April, with hope the ortho concerns will be fixed and allow her tpo be able to move without pain daily.  She has not called a counselor, and stated she will if she notices increased depression symptoms: not participating in activities, not sleeping well.    Expected Outcomes Lielle will seek professional help if symptoms continue. She will have appointment with ortho MD to help her with the knww and hip concerns.    Interventions Encouraged to attend Cardiac Rehabilitation for the exercise   Continue Psychosocial Services  Follow up required by staff      Vocational Rehabilitation: Provide vocational rehab assistance to qualifying candidates.   Vocational Rehab Evaluation & Intervention:     Vocational Rehab - 02/27/16 1343      Initial Vocational Rehab Evaluation & Intervention   Assessment shows need for Vocational Rehabilitation No      Education: Education Goals: Education classes will be provided on a weekly basis, covering required topics. Participant will state understanding/return demonstration of topics presented.  Learning Barriers/Preferences:     Learning Barriers/Preferences - 02/27/16 1342      Learning Barriers/Preferences   Learning Barriers --  Attention deficit   Learning Preferences Pictoral;Skilled  Demonstration;Written Material;None      Education Topics: General Nutrition Guidelines/Fats and Fiber: -Group instruction provided by verbal, written material, models and posters to present the general guidelines for heart healthy nutrition. Gives an explanation and review of dietary fats and fiber.   Cardiac Rehab from 05/01/2016 in [facility] Cardiac and Pulmonary Rehab  Date  04/17/16  Educator  CR  Instruction Review Code  2- meets goals/outcomes      Controlling Sodium/Reading Food Labels: -Group verbal and written material supporting the discussion of sodium use in heart healthy nutrition. Review and explanation with models, verbal and written materials for utilization of the food label.   Exercise Physiology & Risk Factors: - Group verbal and written instruction with models to review the exercise physiology of  the cardiovascular system and associated critical values. Details cardiovascular disease risk factors and the goals associated with each risk factor.   Cardiac Rehab from 05/01/2016 in [facility] Cardiac and Pulmonary Rehab  Date  05/01/16  Educator  [facility]  Instruction Review Code  2- meets goals/outcomes      Aerobic Exercise & Resistance Training: - Gives group verbal and written discussion on the health impact of inactivity. On the components of aerobic and resistive training programs and the benefits of this training and how to safely progress through these programs.   Cardiac Rehab from 05/01/2016 in [facility] Cardiac and Pulmonary Rehab  Date  03/07/16  Educator  [facility]  Instruction Review Code  2- meets goals/outcomes      Flexibility, Balance, General Exercise Guidelines: - Provides group verbal and written instruction on the benefits of flexibility and balance training programs. Provides general exercise guidelines with specific guidelines to those with heart or lung disease. Demonstration and skill practice provided.   Cardiac Rehab from 05/01/2016 in [facility] Cardiac and Pulmonary  Rehab  Date  03/13/16  Educator  [facility]  Instruction Review Code  2- meets goals/outcomes      Stress Management: - Provides group verbal and written instruction about the health risks of elevated stress, cause of high stress, and healthy ways to reduce stress.   Cardiac Rehab from 05/01/2016 in [facility] Cardiac and Pulmonary Rehab  Date  03/22/16  Educator  TS  Instruction Review Code  2- meets goals/outcomes      Depression: - Provides group verbal and written instruction on the correlation between heart/lung disease and depressed mood, treatment options, and the stigmas associated with seeking treatment.   Anatomy & Physiology of the Heart: - Group verbal and written instruction and models provide basic cardiac anatomy and physiology, with the coronary electrical and arterial systems. Review of: AMI, Angina, Valve disease, Heart Failure, Cardiac Arrhythmia, Pacemakers, and the ICD.   Cardiac Rehab from 05/01/2016 in [facility] Cardiac and Pulmonary Rehab  Date  03/20/16  Educator  CE  Instruction Review Code  2- meets goals/outcomes      Cardiac Procedures: - Group verbal and written instruction and models to describe the testing methods done to diagnose heart disease. Reviews the outcomes of the test results. Describes the treatment choices: Medical Management, Angioplasty, or Coronary Bypass Surgery.   Cardiac Rehab from 05/01/2016 in [facility] Cardiac and Pulmonary Rehab  Date  03/27/16  Educator  CE  Instruction Review Code  2- meets goals/outcomes      Cardiac Medications: - Group verbal and written instruction to review commonly prescribed medications for heart disease. Reviews the medication, class of the drug, and side effects. Includes the steps to properly store meds and maintain the prescription regimen.   Cardiac Rehab from 05/01/2016 in [facility] Cardiac and Pulmonary Rehab  Date  04/03/16 [04/04/16 Second Part CE]  Educator  SB  Instruction Review Code  2- meets goals/outcomes       Go Sex-Intimacy & Heart Disease, Get SMART - Goal Setting: - Group verbal and written instruction through game format to discuss heart disease and the return to sexual intimacy. Provides group verbal and written material to discuss and apply goal setting through the application of the S.M.A.R.T. Method.   Cardiac Rehab from 05/01/2016 in [facility] Cardiac and Pulmonary Rehab  Date  03/27/16  Educator  CE  Instruction Review Code  2- meets goals/outcomes      Other Matters of the Heart: - Provides group verbal, written  materials and models to describe Heart Failure, Angina, Valve Disease, and Diabetes in the realm of heart disease. Includes description of the disease process and treatment options available to the cardiac patient.   Cardiac Rehab from 05/01/2016 in [facility] Cardiac and Pulmonary Rehab  Date  03/20/16  Educator  CE  Instruction Review Code  2- meets goals/outcomes      Exercise & Equipment Safety: - Individual verbal instruction and demonstration of equipment use and safety with use of the equipment.   Cardiac Rehab from 05/01/2016 in [facility] Cardiac and Pulmonary Rehab  Date  02/27/16  Educator  SB  Instruction Review Code  2- meets goals/outcomes      Infection Prevention: - Provides verbal and written material to individual with discussion of infection control including proper hand washing and proper equipment cleaning during exercise session.   Cardiac Rehab from 05/01/2016 in [facility] Cardiac and Pulmonary Rehab  Date  02/27/16  Educator  SB  Instruction Review Code  2- meets goals/outcomes      Falls Prevention: - Provides verbal and written material to individual with discussion of falls prevention and safety.   Cardiac Rehab from 05/01/2016 in [facility] Cardiac and Pulmonary Rehab  Date  02/27/16  Educator  Sb  Instruction Review Code  2- meets goals/outcomes      Diabetes: - Individual verbal and written instruction to review signs/symptoms of diabetes, desired ranges  of glucose level fasting, after meals and with exercise. Advice that pre and post exercise glucose checks will be done for 3 sessions at entry of program.    Knowledge Questionnaire Score:     Knowledge Questionnaire Score - 04/10/16 1538      Knowledge Questionnaire Score   Pre Score 20/28   Post Score 24/28      Core Components/Risk Factors/Patient Goals at Admission:     Personal Goals and Risk Factors at Admission - 02/27/16 1332      Core Components/Risk Factors/Patient Goals on Admission    Weight Management Obesity;Weight Loss;Yes   Intervention Weight Management: Develop a combined nutrition and exercise program designed to reach desired caloric intake, while maintaining appropriate intake of nutrient and fiber, sodium and fats, and appropriate energy expenditure required for the weight goal.;Weight Management: Provide education and appropriate resources to help participant work on and attain dietary goals.;Weight Management/Obesity: Establish reasonable short term and long term weight goals.;Obesity: Provide education and appropriate resources to help participant work on and attain dietary goals.   Admit Weight 154 lb 1.6 oz (69.9 kg)   Goal Weight: Short Term 150 lb (68 kg)   Goal Weight: Long Term 130 lb (59 kg)   Expected Outcomes Short Term: Continue to assess and modify interventions until short term weight is achieved;Long Term: Adherence to nutrition and physical activity/exercise program aimed toward attainment of established weight goal;Weight Loss: Understanding of general recommendations for a balanced deficit meal plan, which promotes 1-2 lb weight loss per week and includes a negative energy balance of [phone] kcal/d   Sedentary Yes   Intervention Provide advice, education, support and counseling about physical activity/exercise needs.;Develop an individualized exercise prescription for aerobic and resistive training based on initial evaluation findings, risk  stratification, comorbidities and participant's personal goals.   Expected Outcomes Achievement of increased cardiorespiratory fitness and enhanced flexibility, muscular endurance and strength shown through measurements of functional capacity and personal statement of participant.   Increase Strength and Stamina Yes   Intervention Provide advice, education, support and counseling about physical activity/exercise needs.;Develop  an individualized exercise prescription for aerobic and resistive training based on initial evaluation findings, risk stratification, comorbidities and participant's personal goals.   Expected Outcomes Achievement of increased cardiorespiratory fitness and enhanced flexibility, muscular endurance and strength shown through measurements of functional capacity and personal statement of participant.   Hypertension Yes   Intervention Provide education on lifestyle modifcations including regular physical activity/exercise, weight management, moderate sodium restriction and increased consumption of fresh fruit, vegetables, and low fat dairy, alcohol moderation, and smoking cessation.;Monitor prescription use compliance.   Expected Outcomes Short Term: Continued assessment and intervention until BP is < 140/60m HG in hypertensive participants. < 130/848mHG in hypertensive participants with diabetes, heart failure or chronic kidney disease.;Long Term: Maintenance of blood pressure at goal levels.   Lipids Yes   Intervention Provide education and support for participant on nutrition & aerobic/resistive exercise along with prescribed medications to achieve LDL '70mg'$ , HDL >'40mg'$ .   Expected Outcomes Short Term: Participant states understanding of desired cholesterol values and is compliant with medications prescribed. Participant is following exercise prescription and nutrition guidelines.;Long Term: Cholesterol controlled with medications as prescribed, with individualized exercise RX and  with personalized nutrition plan. Value goals: LDL < '70mg'$ , HDL > 40 mg.   Stress Yes  Current stress is being resolved soon.  Has non-family person living with her and it has become stressful   Intervention Offer individual and/or small group education and counseling on adjustment to heart disease, stress management and health-related lifestyle change. Teach and support self-help strategies.;Refer participants experiencing significant psychosocial distress to appropriate mental health specialists for further evaluation and treatment. When possible, include family members and significant others in education/counseling sessions.   Expected Outcomes Short Term: Participant demonstrates changes in health-related behavior, relaxation and other stress management skills, ability to obtain effective social support, and compliance with psychotropic medications if prescribed.;Long Term: Emotional wellbeing is indicated by absence of clinically significant psychosocial distress or social isolation.      Core Components/Risk Factors/Patient Goals Review:      Goals and Risk Factor Review    Row Name 03/20/16 [id number]/14/18 0844 04/27/16 1051         Core Components/Risk Factors/Patient Goals Review   Personal Goals Review Weight Management/Obesity;Hypertension;Lipids Weight Management/Obesity;Hypertension;Lipids;Stress  -     Review WEight still 154lbs. She is going to [employer]. Calee's MD changed her from Lisinopril to Metoprolol. She feels her cholestrol medicine is making her leg sore. She has a call into her MD already.  Weight is down 7 pounds since she started [employer], is following nutrition plan and advice from program RD alonf with her weigth watchers plan. She is compliant with her meds and is doinf well with BP. She is taking a new cholesterol med and so far has not had symptoms with htis new med. Stress continues, she takes meds as needed and she has name of a counselor to call when she  feels she may need talk therapy.  Still doing Weight Watchers. Sheray wants and hopes this 3rd knee doctor will help her (appt is April 4th). I suggested the option of going ahead and doing at least the evaluation for physical therapy since that should help her constant knee pain. Jalisia reported that she has met indiviudally with the Cardiac Rehab REgistered Dietician and that she has already cut back on Saturated Fats and sodium.      Expected Outcomes Heart healthy lifestyle Continue with goals to maintian heart healthy lifestyle. Follow up with counselor if  needed Heart healthy lifestyle        Core Components/Risk Factors/Patient Goals at Discharge (Final Review):      Goals and Risk Factor Review - 04/27/16 1051      Core Components/Risk Factors/Patient Goals Review   Review Still doing Weight Watchers. Nalia wants and hopes this 3rd knee doctor will help her (appt is April 4th). I suggested the option of going ahead and doing at least the evaluation for physical therapy since that should help her constant knee pain. Zuha reported that she has met indiviudally with the Cardiac Rehab REgistered Dietician and that she has already cut back on Saturated Fats and sodium.    Expected Outcomes Heart healthy lifestyle      ITP Comments:     ITP Comments    Row Name 02/27/16 1328 03/14/16 0555 03/14/16 0611 03/20/16 0934 03/21/16 0859   ITP Comments Medical Review completed. Initial ITP created.  Diagnosis documentation can be found in CARE EVERYWHERE 01/03/2016 admission [facility] 30 day review. Continue with ITP unless directed changes per Medical Director review. 30 day review. Continue with ITP unless directed changes per Medical Director review. Casidy said she is having right leg pain she thinks due to her cholestrol medicine. I gave her a stretching exercise to see if that helps too. Jla came in today for exercise.  She was limping more than normal.  She decided to go see Dr. Ubaldo Glassing  instead of exercising today to see if he could reduce her medication.   [place] Name 03/21/16 1451 04/11/16 0556 05/09/16 0538       ITP Comments Called to check on pt.  She was told that she could hold her statin for a week to see if she has any pain relief.  She hopes to be here tomorrow. 30 day review. Continue with ITP unless directed changes per Medical Director review 30 day review. Continue with ITP unless directed changes per Medical Director review        Comments: Discharge ITP

## 2016-05-10 NOTE — Progress Notes (Signed)
Discharge Summary  Patient Details  Name: [name] MRN: [id number] Date of Birth: July 21, 1950 Referring Provider:     Cardiac Rehab from 02/27/2016 in [facility]  Referring Provider  Bartholome Bill MD       Number of Visits: 36/36  Reason for Discharge:  Patient reached a stable level of exercise. Patient independent in their exercise.  Smoking History:  History  Smoking Status  . Former Smoker  . Quit date: 02/13/1983  Smokeless Tobacco  . Not on file    Diagnosis:  ST elevation myocardial infarction (STEMI), unspecified artery (HCC)  S/P PTCA (percutaneous transluminal coronary angioplasty)  ADL UCSD:   Initial Exercise Prescription:     Initial Exercise Prescription - 02/27/16 1600      Date of Initial Exercise RX and Referring Provider   Date 02/27/16   Referring Provider Bartholome Bill MD     Treadmill   MPH 2.6   Grade 0.5   Minutes 15   METs 3.17     NuStep   Level 3   Minutes 15   METs 2     Recumbant Elliptical   Level 2   RPM 50   Minutes 15   METs 2     Prescription Details   Frequency (times per week) 3   Duration Progress to 45 minutes of aerobic exercise without signs/symptoms of physical distress     Intensity   THRR 40-80% of Max Heartrate 100-137   Ratings of Perceived Exertion 11-15   Perceived Dyspnea 0-4     Progression   Progression Continue to progress workloads to maintain intensity without signs/symptoms of physical distress.     Resistance Training   Training Prescription Yes   Weight 3 lbs   Reps 10-15      Discharge Exercise Prescription (Final Exercise Prescription Changes):     Exercise Prescription Changes - 05/02/16 1000      Response to Exercise   Blood Pressure (Admit) 126/64   Blood Pressure (Exercise) 126/64   Blood Pressure (Exit) 118/62   Heart Rate (Admit) 86 bpm   Heart Rate (Exercise) 104 bpm   Heart Rate (Exit) 66 bpm   Rating of Perceived Exertion (Exercise) 12    Symptoms hip/knee pain   Duration Continue with 45 min of aerobic exercise without signs/symptoms of physical distress.   Intensity THRR unchanged     Progression   Progression Continue to progress workloads to maintain intensity without signs/symptoms of physical distress.   Average METs 3.65     Resistance Training   Training Prescription Yes   Weight 5 lbs   Reps 10-15     Interval Training   Interval Training Yes   Equipment NuStep   Comments 26mn 30 sec     Treadmill   MPH 2.6   Grade 1   Minutes 15   METs 3.35     NuStep   Level 5   Minutes 15   METs 5.4     Recumbant Elliptical   Level 3   Minutes 15   METs 2.5     Home Exercise Plan   [facility] [employer] [employer]  [facility] Add 2 additional days to program exercise sessions.   Initial Home Exercises Provided 03/14/16      Functional Capacity:     6 Minute Walk    Row Name 02/27/16 1623 04/17/16 1018       6 Minute  Walk   Phase Initial Discharge    Distance 1530 feet 1700 feet    Distance % Change  - 11.1 %  170 ft    Walk Time 6 minutes 6 minutes    # of Rest Breaks 0 0    MPH 2.9 3.22    METS 3.67 3.88    RPE 10 12    VO2 Peak 12.8 13.58    Symptoms No Yes (comment)    Comments  - right leg/hip pain 6/10    Resting HR 7 bpm 66 bpm    Resting BP 126/54 116/60    Max Ex. HR 113 bpm 98 bpm    Max Ex. BP 134/70 144/74    2 Minute Post BP 126/64  -       Psychological, QOL, Others - Outcomes: PHQ 2/9: Depression screen [facility] 2/9 04/10/2016 02/27/2016  Decreased Interest 1 2  Down, Depressed, Hopeless 1 0  PHQ - 2 Score 2 2  Altered sleeping 1 3  Tired, decreased energy 1 1  Change in appetite 0 2  Feeling bad or failure about yourself  0 1  Trouble concentrating 0 2  Moving slowly or fidgety/restless 0 0  Suicidal thoughts 0 0  PHQ-9 Score 4 11  Difficult doing work/chores Not difficult at all Somewhat difficult     Quality of Life:     Quality of Life - 04/10/16 1538      Quality of Life Scores   Health/Function Pre 19.33 %   Health/Function Post 21.77 %   Health/Function % Change 12.62 %   Socioeconomic Pre 24 %   Socioeconomic Post 24 %   Socioeconomic % Change  0 %   Psych/Spiritual Pre 30 %   Psych/Spiritual Post 22.57 %   Psych/Spiritual % Change -24.77 %   Family Pre 28.8 %   Family Post 24 %   Family % Change -16.67 %   GLOBAL Pre 24 %   GLOBAL Post 22.72 %   GLOBAL % Change -5.33 %      Personal Goals: Goals established at orientation with interventions provided to work toward goal.     Personal Goals and Risk Factors at Admission - 02/27/16 1332      Core Components/Risk Factors/Patient Goals on Admission    Weight Management Obesity;Weight Loss;Yes   Intervention Weight Management: Develop a combined nutrition and exercise program designed to reach desired caloric intake, while maintaining appropriate intake of nutrient and fiber, sodium and fats, and appropriate energy expenditure required for the weight goal.;Weight Management: Provide education and appropriate resources to help participant work on and attain dietary goals.;Weight Management/Obesity: Establish reasonable short term and long term weight goals.;Obesity: Provide education and appropriate resources to help participant work on and attain dietary goals.   Admit Weight 154 lb 1.6 oz (69.9 kg)   Goal Weight: Short Term 150 lb (68 kg)   Goal Weight: Long Term 130 lb (59 kg)   Expected Outcomes Short Term: Continue to assess and modify interventions until short term weight is achieved;Long Term: Adherence to nutrition and physical activity/exercise program aimed toward attainment of established weight goal;Weight Loss: Understanding of general recommendations for a balanced deficit meal plan, which promotes 1-2 lb weight loss per week and includes a negative energy balance of [phone] kcal/d   Sedentary Yes    Intervention Provide advice, education, support and counseling about physical activity/exercise needs.;Develop an individualized exercise prescription for aerobic and resistive training based on initial evaluation findings, risk  stratification, comorbidities and participant's personal goals.   Expected Outcomes Achievement of increased cardiorespiratory fitness and enhanced flexibility, muscular endurance and strength shown through measurements of functional capacity and personal statement of participant.   Increase Strength and Stamina Yes   Intervention Provide advice, education, support and counseling about physical activity/exercise needs.;Develop an individualized exercise prescription for aerobic and resistive training based on initial evaluation findings, risk stratification, comorbidities and participant's personal goals.   Expected Outcomes Achievement of increased cardiorespiratory fitness and enhanced flexibility, muscular endurance and strength shown through measurements of functional capacity and personal statement of participant.   Hypertension Yes   Intervention Provide education on lifestyle modifcations including regular physical activity/exercise, weight management, moderate sodium restriction and increased consumption of fresh fruit, vegetables, and low fat dairy, alcohol moderation, and smoking cessation.;Monitor prescription use compliance.   Expected Outcomes Short Term: Continued assessment and intervention until BP is < 140/24m HG in hypertensive participants. < 130/811mHG in hypertensive participants with diabetes, heart failure or chronic kidney disease.;Long Term: Maintenance of blood pressure at goal levels.   Lipids Yes   Intervention Provide education and support for participant on nutrition & aerobic/resistive exercise along with prescribed medications to achieve LDL <7058mHDL >18m52m Expected Outcomes Short Term: Participant states understanding of desired cholesterol  values and is compliant with medications prescribed. Participant is following exercise prescription and nutrition guidelines.;Long Term: Cholesterol controlled with medications as prescribed, with individualized exercise RX and with personalized nutrition plan. Value goals: LDL < 70mg20mL > 40 mg.   Stress Yes  Current stress is being resolved soon.  Has non-family person living with her and it has become stressful   Intervention Offer individual and/or small group education and counseling on adjustment to heart disease, stress management and health-related lifestyle change. Teach and support self-help strategies.;Refer participants experiencing significant psychosocial distress to appropriate mental health specialists for further evaluation and treatment. When possible, include family members and significant others in education/counseling sessions.   Expected Outcomes Short Term: Participant demonstrates changes in health-related behavior, relaxation and other stress management skills, ability to obtain effective social support, and compliance with psychotropic medications if prescribed.;Long Term: Emotional wellbeing is indicated by absence of clinically significant psychosocial distress or social isolation.       Personal Goals Discharge:     Goals and Risk Factor Review    Row Name 03/20/16 0943 [phone] 04/27/16 1051         Core Components/Risk Factors/Patient Goals Review   Personal Goals Review Weight Management/Obesity;Hypertension;Lipids Weight Management/Obesity;Hypertension;Lipids;Stress  -     Review WEight still 154lbs. She is going to [employer]. Lorelie's MD changed her from Lisinopril to Metoprolol. She feels her cholestrol medicine is making her leg sore. She has a call into her MD already.  Weight is down 7 pounds since she started [employer], is following nutrition plan and advice from program RD alonf with her weigth watchers plan. She is compliant with her meds  and is doinf well with BP. She is taking a new cholesterol med and so far has not had symptoms with htis new med. Stress continues, she takes meds as needed and she has name of a counselor to call when she feels she may need talk therapy.  Still doing [employer]. Skyeler wants and hopes this 3rd knee doctor will help her (appt is April 4th). I suggested the option of going ahead and doing at least the evaluation for physical therapy since that should help her  constant knee pain. Lynnet reported that she has met indiviudally with the Cardiac Rehab REgistered Dietician and that she has already cut back on Saturated Fats and sodium.      Expected Outcomes Heart healthy lifestyle Continue with goals to maintian heart healthy lifestyle. Follow up with counselor if needed Heart healthy lifestyle        Nutrition & Weight - Outcomes:     Pre Biometrics - 02/27/16 1624      Pre Biometrics   Height 5' 3.2" (1.605 m)   Weight 154 lb 8 oz (70.1 kg)   Waist Circumference 33 inches   Hip Circumference 40 inches   Waist to Hip Ratio 0.82 %   BMI (Calculated) 27.3   Single Leg Stand 28.51 seconds         Post Biometrics - 04/17/16 1019       Post  Biometrics   Height 5' 3.2" (1.605 m)   Weight 155 lb (70.3 kg)   Waist Circumference 32 inches   Hip Circumference 40 inches   Waist to Hip Ratio 0.8 %   BMI (Calculated) 27.3   Single Leg Stand 15.91 seconds      Nutrition:     Nutrition Therapy & Goals - 02/27/16 1332      Intervention Plan   Intervention Prescribe, educate and counsel regarding individualized specific dietary modifications aiming towards targeted core components such as weight, hypertension, lipid management, diabetes, heart failure and other comorbidities.   Expected Outcomes Short Term Goal: Understand basic principles of dietary content, such as calories, fat, sodium, cholesterol and nutrients.;Short Term Goal: A plan has been developed with personal nutrition goals set  during dietitian appointment.;Long Term Goal: Adherence to prescribed nutrition plan.      Nutrition Discharge:     Nutrition Assessments - 04/10/16 1536      Rate Your Plate Scores   Post Score 78   Post Score % 86.7 %     MEDFICTS Scores   Pre Score 28   Post Score 24   Score Difference -4      Education Questionnaire Score:     Knowledge Questionnaire Score - 04/10/16 1538      Knowledge Questionnaire Score   Pre Score 20/28   Post Score 24/28      Goals reviewed with patient; copy given to patient.

## 2016-06-08 ENCOUNTER — Other Ambulatory Visit: Payer: Self-pay | Admitting: Cardiology

## 2016-06-08 DIAGNOSIS — I252 Old myocardial infarction: Secondary | ICD-10-CM

## 2016-06-19 ENCOUNTER — Ambulatory Visit (HOSPITAL_COMMUNITY)
Admission: RE | Admit: 2016-06-19 | Discharge: 2016-06-19 | Disposition: A | Discharge status: Home / Self Care | Payer: Medicare Other | Source: Ambulatory Visit | Attending: Cardiology | Admitting: Cardiology

## 2016-06-19 ENCOUNTER — Encounter (HOSPITAL_COMMUNITY): Payer: Self-pay

## 2016-06-19 DIAGNOSIS — I252 Old myocardial infarction: Secondary | ICD-10-CM

## 2016-07-02 ENCOUNTER — Other Ambulatory Visit: Payer: Self-pay | Admitting: Cardiology

## 2016-07-03 ENCOUNTER — Encounter
Admission: RE | Disposition: A | Discharge status: Home / Self Care | Payer: Self-pay | Source: Ambulatory Visit | Attending: Cardiology

## 2016-07-03 ENCOUNTER — Encounter: Payer: Self-pay | Admitting: *Deleted

## 2016-07-03 ENCOUNTER — Ambulatory Visit
Admission: RE | Admit: 2016-07-03 | Discharge: 2016-07-03 | Disposition: A | Discharge status: Home / Self Care | Payer: Medicare Other | Source: Ambulatory Visit | Attending: Cardiology | Admitting: Cardiology

## 2016-07-03 DIAGNOSIS — Z7982 Long term (current) use of aspirin: Secondary | ICD-10-CM | POA: Diagnosis not present

## 2016-07-03 DIAGNOSIS — I7300 Raynaud's syndrome without gangrene: Secondary | ICD-10-CM | POA: Insufficient documentation

## 2016-07-03 DIAGNOSIS — Z8249 Family history of ischemic heart disease and other diseases of the circulatory system: Secondary | ICD-10-CM | POA: Insufficient documentation

## 2016-07-03 DIAGNOSIS — Z87440 Personal history of urinary (tract) infections: Secondary | ICD-10-CM | POA: Diagnosis not present

## 2016-07-03 DIAGNOSIS — Z882 Allergy status to sulfonamides status: Secondary | ICD-10-CM | POA: Insufficient documentation

## 2016-07-03 DIAGNOSIS — I272 Pulmonary hypertension, unspecified: Secondary | ICD-10-CM | POA: Insufficient documentation

## 2016-07-03 DIAGNOSIS — I25118 Atherosclerotic heart disease of native coronary artery with other forms of angina pectoris: Secondary | ICD-10-CM | POA: Diagnosis not present

## 2016-07-03 DIAGNOSIS — Z955 Presence of coronary angioplasty implant and graft: Secondary | ICD-10-CM | POA: Diagnosis not present

## 2016-07-03 DIAGNOSIS — M064 Inflammatory polyarthropathy: Secondary | ICD-10-CM | POA: Insufficient documentation

## 2016-07-03 DIAGNOSIS — Z87891 Personal history of nicotine dependence: Secondary | ICD-10-CM | POA: Insufficient documentation

## 2016-07-03 DIAGNOSIS — F419 Anxiety disorder, unspecified: Secondary | ICD-10-CM | POA: Diagnosis not present

## 2016-07-03 DIAGNOSIS — I2510 Atherosclerotic heart disease of native coronary artery without angina pectoris: Secondary | ICD-10-CM | POA: Diagnosis present

## 2016-07-03 DIAGNOSIS — Z880 Allergy status to penicillin: Secondary | ICD-10-CM | POA: Insufficient documentation

## 2016-07-03 DIAGNOSIS — Z7902 Long term (current) use of antithrombotics/antiplatelets: Secondary | ICD-10-CM | POA: Diagnosis not present

## 2016-07-03 DIAGNOSIS — I252 Old myocardial infarction: Secondary | ICD-10-CM | POA: Diagnosis not present

## 2016-07-03 DIAGNOSIS — M3500 Sicca syndrome, unspecified: Secondary | ICD-10-CM | POA: Insufficient documentation

## 2016-07-03 DIAGNOSIS — Z881 Allergy status to other antibiotic agents status: Secondary | ICD-10-CM | POA: Insufficient documentation

## 2016-07-03 HISTORY — PX: LEFT HEART CATH AND CORONARY ANGIOGRAPHY: CATH118249

## 2016-07-03 LAB — PROTIME-INR
INR: 0.99
Prothrombin Time: 13.1 seconds (ref 11.4–15.2)

## 2016-07-03 SURGERY — LEFT HEART CATH AND CORONARY ANGIOGRAPHY
Anesthesia: Moderate Sedation | Laterality: Left

## 2016-07-03 SURGERY — LEFT HEART CATH AND CORONARY ANGIOGRAPHY
Anesthesia: Moderate Sedation

## 2016-07-03 MED ORDER — SODIUM CHLORIDE 0.9% FLUSH: 3.0000 mL | Freq: Two times a day (BID) | INTRAVENOUS | Status: DC

## 2016-07-03 MED ORDER — SODIUM CHLORIDE 0.9 % WEIGHT BASED INFUSION: 1.0000 mL/kg/h | INTRAVENOUS | Status: DC

## 2016-07-03 MED ORDER — ONDANSETRON HCL 4 MG/2ML IJ SOLN: 4.0000 mg | Freq: Four times a day (QID) | INTRAMUSCULAR | Status: DC | PRN

## 2016-07-03 MED ORDER — FENTANYL CITRATE (PF) 100 MCG/2ML IJ SOLN
INTRAMUSCULAR | Status: AC
  Filled 2016-07-03: qty 2

## 2016-07-03 MED ORDER — SODIUM CHLORIDE 0.9% FLUSH: 3.0000 mL | INTRAVENOUS | Status: DC | PRN

## 2016-07-03 MED ORDER — HEPARIN (PORCINE) IN NACL 2-0.9 UNIT/ML-% IJ SOLN
INTRAMUSCULAR | Status: AC
  Filled 2016-07-03: qty 500

## 2016-07-03 MED ORDER — MIDAZOLAM HCL 2 MG/2ML IJ SOLN
INTRAMUSCULAR | Status: AC
  Filled 2016-07-03: qty 2

## 2016-07-03 MED ORDER — SODIUM CHLORIDE 0.9 % WEIGHT BASED INFUSION
3.0000 mL/kg/h | INTRAVENOUS | Status: DC
  Administered 2016-07-03: 3 mL/kg/h via INTRAVENOUS

## 2016-07-03 MED ORDER — SODIUM CHLORIDE 0.9 % IV SOLN: 250.0000 mL | INTRAVENOUS | Status: DC | PRN

## 2016-07-03 MED ORDER — MIDAZOLAM HCL 2 MG/2ML IJ SOLN
INTRAMUSCULAR | Status: DC | PRN
  Administered 2016-07-03 (×2): 1 mg via INTRAVENOUS

## 2016-07-03 MED ORDER — FENTANYL CITRATE (PF) 100 MCG/2ML IJ SOLN
INTRAMUSCULAR | Status: DC | PRN
  Administered 2016-07-03: 25 ug via INTRAVENOUS

## 2016-07-03 MED ORDER — ACETAMINOPHEN 325 MG PO TABS: 650.0000 mg | ORAL_TABLET | ORAL | Status: DC | PRN

## 2016-07-03 MED ORDER — SODIUM CHLORIDE 0.9 % WEIGHT BASED INFUSION
1.0000 mL/kg/h | INTRAVENOUS | Status: DC
  Administered 2016-07-03: 1 mL/kg/h via INTRAVENOUS

## 2016-07-03 MED ORDER — ASPIRIN 81 MG PO CHEW: 81.0000 mg | CHEWABLE_TABLET | ORAL | Status: DC

## 2016-07-03 SURGICAL SUPPLY — 9 items
CATH 5FR PIGTAIL DIAGNOSTIC (CATHETERS) ×3 IMPLANT
CATH INFINITI 5FR JL4 (CATHETERS) ×3 IMPLANT
CATH INFINITI JR4 5F (CATHETERS) ×3 IMPLANT
DEVICE CLOSURE MYNXGRIP 5F (Vascular Products) ×3 IMPLANT
GUIDEWIRE 3MM J TIP .035 145 (WIRE) ×3 IMPLANT
KIT MANI 3VAL PERCEP (MISCELLANEOUS) ×3 IMPLANT
NEEDLE PERC 18GX7CM (NEEDLE) ×3 IMPLANT
PACK CARDIAC CATH (CUSTOM PROCEDURE TRAY) ×3 IMPLANT
SHEATH AVANTI 5FR X 11CM (SHEATH) ×3 IMPLANT

## 2016-07-03 NOTE — Discharge Instructions (Signed)
Angiogram, Care After °This sheet gives you information about how to care for yourself after your procedure. Your health care provider may also give you more specific instructions. If you have problems or questions, contact your health care provider. °What can I expect after the procedure? °After the procedure, it is common to have bruising and tenderness at the catheter insertion area. °Follow these instructions at home: °Insertion site care  °· Follow instructions from your health care provider about how to take care of your insertion site. Make sure you: °¨ Wash your hands with soap and water before you change your bandage (dressing). If soap and water are not available, use hand sanitizer. °¨ Change your dressing as told by your health care provider. °¨ Leave stitches (sutures), skin glue, or adhesive strips in place. These skin closures may need to stay in place for 2 weeks or longer. If adhesive strip edges start to loosen and curl up, you may trim the loose edges. Do not remove adhesive strips completely unless your health care provider tells you to do that. °· Do not take baths, swim, or use a hot tub until your health care provider approves. °· You may shower 24-48 hours after the procedure or as told by your health care provider. °¨ Gently wash the site with plain soap and water. °¨ Pat the area dry with a clean towel. °¨ Do not rub the site. This may cause bleeding. °· Do not apply powder or lotion to the site. Keep the site clean and dry. °· Check your insertion site every day for signs of infection. Check for: °¨ Redness, swelling, or pain. °¨ Fluid or blood. °¨ Warmth. °¨ Pus or a bad smell. °Activity  °· Rest as told by your health care provider, usually for 1-2 days. °· Do not lift anything that is heavier than 10 lbs. (4.5 kg) or as told by your health care provider. °· Do not drive for 24 hours if you were given a medicine to help you relax (sedative). °· Do not drive or use heavy machinery while  taking prescription pain medicine. °General instructions  °· Return to your normal activities as told by your health care provider, usually in about a week. Ask your health care provider what activities are safe for you. °· If the catheter site starts bleeding, lie flat and put pressure on the site. If the bleeding does not stop, get help right away. This is a medical emergency. °· Drink enough fluid to keep your urine clear or pale yellow. This helps flush the contrast dye from your body. °· Take over-the-counter and prescription medicines only as told by your health care provider. °· Keep all follow-up visits as told by your health care provider. This is important. °Contact a health care provider if: °· You have a fever or chills. °· You have redness, swelling, or pain around your insertion site. °· You have fluid or blood coming from your insertion site. °· The insertion site feels warm to the touch. °· You have pus or a bad smell coming from your insertion site. °· You have bruising around the insertion site. °· You notice blood collecting in the tissue around the catheter site (hematoma). The hematoma may be painful to the touch. °Get help right away if: °· You have severe pain at the catheter insertion area. °· The catheter insertion area swells very fast. °· The catheter insertion area is bleeding, and the bleeding does not stop when you hold steady pressure on   the area. °· The area near or just beyond the catheter insertion site becomes pale, cool, tingly, or numb. °These symptoms may represent a serious problem that is an emergency. Do not wait to see if the symptoms will go away. Get medical help right away. Call your local emergency services (911 in the [place] Do not drive yourself to the hospital. °Summary °· After the procedure, it is common to have bruising and tenderness at the catheter insertion area. °· After the procedure, it is important to rest and drink plenty of fluids. °· Do not take baths,  swim, or use a hot tub until your health care provider says it is okay to do so. You may shower 24-48 hours after the procedure or as told by your health care provider. °· If the catheter site starts bleeding, lie flat and put pressure on the site. If the bleeding does not stop, get help right away. This is a medical emergency. °This information is not intended to replace advice given to you by your health care provider. Make sure you discuss any questions you have with your health care provider. °Document Released: 08/17/2004 Document Revised: 01/04/2016 Document Reviewed: 01/04/2016 °[employer]vier Interactive Patient Education © 2017 [employer] °Moderate Conscious Sedation, Adult, Care After °These instructions provide you with information about caring for yourself after your procedure. Your health care provider may also give you more specific instructions. Your treatment has been planned according to current medical practices, but problems sometimes occur. Call your health care provider if you have any problems or questions after your procedure. °What can I expect after the procedure? °After your procedure, it is common: °· To feel sleepy for several hours. °· To feel clumsy and have poor balance for several hours. °· To have poor judgment for several hours. °· To vomit if you eat too soon. °Follow these instructions at home: °For at least 24 hours after the procedure:  ° °· Do not: °¨ Participate in activities where you could fall or become injured. °¨ Drive. °¨ Use heavy machinery. °¨ Drink alcohol. °¨ Take sleeping pills or medicines that cause drowsiness. °¨ Make important decisions or sign legal documents. °¨ Take care of children on your own. °· Rest. °Eating and drinking  °· Follow the diet recommended by your health care provider. °· If you vomit: °¨ Drink water, juice, or soup when you can drink without vomiting. °¨ Make sure you have little or no nausea before eating solid foods. °General instructions   °· Have a responsible adult stay with you until you are awake and alert. °· Take over-the-counter and prescription medicines only as told by your health care provider. °· If you smoke, do not smoke without supervision. °· Keep all follow-up visits as told by your health care provider. This is important. °Contact a health care provider if: °· You keep feeling nauseous or you keep vomiting. °· You feel light-headed. °· You develop a rash. °· You have a fever. °Get help right away if: °· You have trouble breathing. °This information is not intended to replace advice given to you by your health care provider. Make sure you discuss any questions you have with your health care provider. °Document Released: 11/19/2012 Document Revised: 07/04/2015 Document Reviewed: 05/21/2015 °Elsevier Interactive Patient Education © 2017 [employer] ° °

## 2016-07-03 NOTE — H&P (Signed)
Chief Complaint: Chief Complaint  Patient presents with  . pain behind breast  off and on  Date of Service: 06/08/2016 Date of Birth: April 14, 1950 PCP: Macie Burows, MD  History of Present Illness: [name] is a 66 y.o.female patient who presents for evaluation of chest pain. Patient had a PCI of the LAD in the past. Functional study in January of 2017 revealed no ischemia. While in [facility] in November 2017 she developed substernal chest pain. She was noted to have ST elevation in the inferior distribution was transferred to [facility] where she underwent cardiac catheterization revealing significant RCA disease. She underwent PCI with placement of a Medtronic resolute stent 3.5 x 30 mm in in the RCA. The LAD stent was patent. She is currently on dual antiplatelet therapy with aspirin and Plavix. She is also on metoprolol, Crestor and amlodipine. She has had further chest pain. She describes it as a discomfort under her left breast. She had this intermittently prior to her ST elevation myocardial infarction although the pain that she had during the ST elevation myocardial infarction was different than this. EKG today reveals sinus rhythm at a rate of 64 with a PR interval of 162 ms, QRS duration of 82 ms with a QTC of 414 ms QRS axis 31 there is no ischemia. His both with rest and exertion. Past Medical and Surgical History  Past Medical History Past Medical History:  Diagnosis Date  . Anxiety state, unspecified  . 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 Positive FANA, SSA, SSB antibodies, positive rheumatoid factor, negative anti-CCP. b. Inflammatory arthritis. c. Sjogren's. d. Onset 1986 after pregnancy. e. Raynaud's. f. Mild pulmonary hypertension, right ventricular systolic pressure 36 in 6767.  Marland Kitchen Coronary disease ([place]) 08/31/2013  a. Stent  . Eczema, unspecified 08/31/2013  . Raynaud's disease  . Recurrent UTI   Past Surgical History She  has a past surgical history that includes Endoscopic Carpal Tunnel Release; Tonsillectomy; Cesarean section; Tubal ligation; and Hysterectomy (01/2014).   Medications and Allergies  Current Medications  Current Outpatient Prescriptions  Medication Sig Dispense Refill  . aspirin 81 MG EC tablet Take 81 mg by mouth.  . biotin 1 mg Cap Take by mouth.  . clopidogrel (PLAVIX) 75 mg tablet Take 1 tablet (75 mg total) by mouth once daily. 30 tablet 11  . diphenhydrAMINE (BENADRYL) 25 mg capsule Take 25 mg by mouth every 6 (six) hours as needed for Itching. Reported on 01/28/2015  . EPINEPHrine (EPIPEN) 0.3 mg/0.3 mL pen injector Inject 0.3 mg into the muscle once as needed for Anaphylaxis. Reported on 01/28/2015  . ESTRACE 0.01 % (0.1 mg/gram) vaginal cream USE INTRAVAGINALLY TWO TIMES WEEKLY. 42.5 g 0  . gabapentin (NEURONTIN) 300 MG capsule Take 1 capsule (300 mg total) by mouth nightly. 30 capsule 12  . hydroxychloroquine (PLAQUENIL) 200 mg tablet TAKE (2) TABLETS BY MOUTH ONCE DAILY. 60 tablet 6  . meloxicam (MOBIC) 15 MG tablet Take 1 tablet (15 mg total) by mouth once daily as needed for Pain. 30 tablet 0  . metoprolol tartrate (LOPRESSOR) 25 MG tablet Take 1 tablet (25 mg total) by mouth 2 (two) times daily. 180 tablet 3  . nitrofurantoin, macrocrystal-monohydrate, (MACROBID) 100 MG capsule Take 1 capsule (100 mg total) by mouth 2 (two) times daily. 14 capsule 0  . rosuvastatin (CRESTOR) 5 MG tablet Take 1 tablet (5 mg total) by mouth once daily. 30 tablet 11  . tolterodine (DETROL) 2 MG tablet  Take 1 tablet (2 mg total) by mouth 2 (two) times daily. 180 tablet 53  . traMADol (ULTRAM) 50 mg tablet Take by mouth.  . valACYclovir (VALTREX) 500 MG tablet  . venlafaxine (EFFEXOR-XR) 75 MG XR capsule TAKE (1) CAPSULE BY MOUTH TWICE DAILY. 60 capsule 5   No current facility-administered medications for this visit.   Allergies: Albuterol; Amoxicillin; Biaxin [clarithromycin]; Ceftin [cefuroxime  axetil]; Ciprofloxacin; Clindamycin; Doxycycline; Levaquin [levofloxacin]; Sulfa (sulfonamide antibiotics); and Omnicef [cefdinir]  Social and Family History  Social History reports that she quit smoking about 33 years ago. She has never used smokeless tobacco. She reports that she drinks alcohol. She reports that she does not use drugs.  Family History Family History  Problem Relation Age of Onset  . High blood pressure (Hypertension) Mother  . Angina Mother  . Myocardial Infarction (Heart attack) Mother  . Arthritis Mother  . Allergies Mother  . Cirrhosis Mother  . Macular degeneration Mother  . Leukemia Father  . High blood pressure (Hypertension) Father  . Hyperlipidemia (Elevated cholesterol) Father  . Nephrolithiasis Father  . Prostate cancer Father  . Cancer Father  hip  . Myocardial Infarction (Heart attack) Father   Review of Systems  Review of Systems  Constitutional: Negative for chills, diaphoresis, fever, malaise/fatigue and weight loss.  HENT: Negative for congestion, ear discharge, hearing loss and tinnitus.  Eyes: Negative for blurred vision.  Respiratory: Negative for cough, hemoptysis, sputum production, shortness of breath and wheezing.  Cardiovascular: Negative for palpitations, orthopnea, claudication, leg swelling and PND.  Gastrointestinal: Negative for abdominal pain, blood in stool, constipation, diarrhea, heartburn, melena, nausea and vomiting.  Genitourinary: Negative for dysuria, frequency, hematuria and urgency.  Musculoskeletal: Negative for back pain, falls, joint pain and myalgias.  Skin: Negative for itching and rash.  Neurological: Negative for dizziness, tingling, focal weakness, loss of consciousness, weakness and headaches.  Endo/Heme/Allergies: Negative for polydipsia. Does not bruise/bleed easily.  Psychiatric/Behavioral: Negative for depression, memory loss and substance abuse. The patient is not nervous/anxious.   Physical Examination    Vitals: BP 124/70  Pulse 64  Resp 12  Ht 161.3 cm (5' 3.5")  Wt 68.9 kg (152 lb)  BMI 26.50 kg/m  Ht:161.3 cm (5' 3.5") Wt:68.9 kg (152 lb) PRF:FMBW surface area is 1.76 meters squared. Body mass index is 26.5 kg/m.  Wt Readings from Last 3 Encounters:  06/08/16 68.9 kg (152 lb)  03/12/16 71.2 kg (157 lb)  02/24/16 69.8 kg (153 lb 12.8 oz)   BP Readings from Last 3 Encounters:  06/08/16 124/70  03/12/16 130/84  02/24/16 120/84   General appearance appears in no acute distress  Head Mouth and Eye exam Normocephalic, without obvious abnormality, atraumatic Dentition is good Eyes appear anicteric   Neck exam Thyroid: normal  Nodes: no obvious adenopathy  LUNGS Breath Sounds: Normal Percussion: Normal  CARDIOVASCULAR JVP CV wave: no HJR: no Elevation at 90 degrees: None Carotid Pulse: normal pulsation bilaterally Bruit: None Apex: apical impulse normal  Auscultation Rhythm: normal sinus rhythm S1: normal S2: normal Clicks: no Rub: no Murmurs: no murmurs  Gallop: None ABDOMEN Liver enlargement: no Pulsatile aorta: no Ascites: no Bruits: no  EXTREMITIES Clubbing: no Edema: trace to 1+ bilateral pedal edema Pulses: peripheral pulses symmetrical Femoral Bruits: no Amputation: no SKIN Rash: no Cyanosis: no Embolic phemonenon: no Bruising: no NEURO Alert and Oriented to person, place and time: yes Non focal: yes  PSYCH: Pt appears to have normal affect  LABS REVIEWED Last 3 CBC results:  Lab Results  Component Value Date  WBC 8.0 04/27/2015  WBC 8.4 01/24/2015  WBC 7.7 10/29/2014   Lab Results  Component Value Date  HGB 13.5 04/27/2015  HGB 13.7 01/24/2015  HGB 10.8 (L) 10/29/2014   Lab Results  Component Value Date  HCT 41.3 04/27/2015  HCT 42.8 01/24/2015  HCT 33.2 (L) 10/29/2014   Lab Results  Component Value Date  PLT 304 04/27/2015  PLT 271 01/24/2015  PLT 565 (H) 10/29/2014   Lab Results  Component Value Date   CREATININE 0.9 04/27/2015  BUN 14 04/27/2015  NA 141 04/27/2015  K 4.3 04/27/2015  CL 104 04/27/2015  CO2 30.0 04/27/2015   Lab Results  Component Value Date  ALT 23 04/27/2015  AST 22 04/27/2015  ALKPHOS 69 04/27/2015   Lab Results  Component Value Date  TSH 1.99 10/01/2014   Assessment and Plan   66 y.o. female with  ICD-10-CM ICD-9-CM  1. Atypical chest pain-patient had a negative functional study in January 2017 however developed a inferior non-ST elevation myocardial infarction in November 2017 treated with PCI. She is now feeling further chest pain similar to what she had prior to her stents. She is tolerating aspirin and clopidogrel well and is on high intensity statin. She is on beta-blocker. Will proceed with a cardiac cath to evaluate patency of her stents and for evidence of progression of disease. R07.89 786.59    2. Coronary artery disease of native artery of native heart with stable angina pectoris (HCC)-as per above I25.118 414.01  413.9  3. Raynaud's disease without gangrene I73.00 443.0  4. Connective tissue disease ([place])-continue with current therapy M35.9 710.9   Return in about 4 weeks (around 07/06/2016).  These notes generated with voice recognition software. I apologize for typographical errors.  Sydnee Levans, MD   Pt seen and examined. No change from above.

## 2016-07-16 ENCOUNTER — Other Ambulatory Visit: Payer: Self-pay | Admitting: Student

## 2016-07-16 ENCOUNTER — Other Ambulatory Visit: Payer: Self-pay | Admitting: Family Medicine

## 2016-07-16 DIAGNOSIS — Z1231 Encounter for screening mammogram for malignant neoplasm of breast: Secondary | ICD-10-CM

## 2016-08-07 ENCOUNTER — Ambulatory Visit
Admission: RE | Admit: 2016-08-07 | Discharge: 2016-08-07 | Disposition: A | Discharge status: Home / Self Care | Payer: Medicare Other | Source: Ambulatory Visit | Attending: Student | Admitting: Student

## 2016-08-07 DIAGNOSIS — Z1231 Encounter for screening mammogram for malignant neoplasm of breast: Secondary | ICD-10-CM | POA: Insufficient documentation

## 2016-08-20 NOTE — Progress Notes (Signed)
Please place orders in EPIC as patient is being scheduled for a pre-op appointment! Thank you! 

## 2016-08-21 ENCOUNTER — Ambulatory Visit: Payer: Self-pay | Admitting: Orthopedic Surgery

## 2016-08-23 ENCOUNTER — Ambulatory Visit: Payer: Self-pay | Admitting: Orthopedic Surgery

## 2016-08-23 NOTE — H&P (Signed)
[name] 08/23/2016 11:15 AM Location: [facility] Patient #: [id number] DOB: 27-Sep-1950 Married / Language: Cleophus Molt / Race: White Female   History of Present Illness (Alexzandrew L. Perkins III PA-C; 08/23/2016 11:54 AM) The patient is a 66 year old female who comes in today for a preoperative History and Physical. The patient is scheduled for a right total hip arthroplasty (anterior) to be performed by Dr. Dione Plover. Aluisio, MD at [facility] on 09/12/2016. The patient is a 66 year old female who presented for follow up of their hip. The patient is being followed for their right hip pain and osteoarthritis. They are months out from intra-articular injection. Symptoms reported include: pain, aching, catching, difficulty ambulating and difficulty arising from chair. The patient feels that they are doing poorly and report their pain level to be mild to moderate. Current treatment includes: activity modification. The following medication has been used for pain control: Tylenol (Arthritis strength). The patient has reported some temporay improvement of their symptoms with the Cortisone injection but it only helped some. She still has good and bad days, depending on the weather and activity. She is concerned with the need for antibiotics due to surgery. She states that she is allergic to many antibiotics. She does know that she is able to take vancomycin. She has been seen by an allergist and has had testing done preoperatively and has recommendations regarding her preoperative antibiotic dosing. The hip injection provided minimal benefit. It helped for a couple of days and then the pain came back badly. She is hurting at all times. It is limiting what she can and cannot do. She has been woken up at night with the hip pain. She has reached a point where she wants to go ahead and proceed with surgical intervention. They have been treated conservatively in the past for the above stated problem  and despite conservative measures, they continue to have progressive pain and severe functional limitations and dysfunction. They have failed non-operative management including home exercise, activity modification, medications, and injections. It is felt that they would benefit from undergoing total joint replacement. Risks and benefits of the procedure have been discussed with the patient and they elect to proceed with surgery. There are no active contraindications to surgery such as ongoing infection or rapidly progressive neurological disease.    Problem List/Past Medical  Trigger ring finger of right hand (M65.341)  Primary osteoarthritis of both hands (M19.041, M19.042)  Primary osteoarthritis of right hip (M16.11)  Impaired Vision  Anxiety Disorder  Peripheral Neuropathy  Pneumonia  Hypertension  Myocardial infarction  Date: 12/2015 Coronary Artery Disease/Heart Disease  Stent  Two Urinary Incontinence  Urinary Tract Infection  Autoimmune disorder  Lupus Systemic Lupus Erythematosus  Eczema  Raynaud's Syndrome  Disease   Allergies Sulfanomides  HEART ATTACK, Low Blood Pressure, Chills Cipro *FLUOROQUINOLONES*  Aniexty Clindamycin Palmitate HCl *ANTI-INFECTIVE AGENTS - MISC.*  Dizziness. Light Headedness Rocephin *CEPHALOSPORINS*  Penicillin G Pot in Dextrose *PENICILLINS*  "Patient is able to take amoxicillin, oxacillin, methicillin. She had a negative allergy testing to penicillin (2016) and passed an amoxicillin challenge (07/2016)". Dr. Okey Dupre Cephalexin *CEPHALOSPORINS*  Cephalosporins  "She has a history of different reactions to this class of drugs, which we will address in the future. She should continue to avoid them for now." Dr. Okey Dupre. Biaxin *MACROLIDES*  GI pain, Jittery Doxycycline Hyclate *TETRACYCLINES*  Jittery Levaquin *FLUOROQUINOLONES*  Unknown Motrin *ANALGESICS - ANTI-INFLAMMATORY*  In High Doses Cefuroxime  Sodium *CEPHALOSPORINS*  Anxiety  Vancomycin HCl *ANTI-INFECTIVE AGENTS - MISC.*  "She had a negavtive allergy testing to vancomycin (2016). 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 Please observe the following precautions: A) Please pretreat patient with Benadryl 50 mg prior to dosing B) Run in the infusion at a slower rate C) Run the infusion over at least 2 hours or longer  Family History Father  Deceased, Heart disease, Cancer, Nephrolithiasis. age 83 Mother  Deceased, Heart disease, Angina Pectoris. age 55  Social History  Children  1 Current drinker  05/17/2016: Currently drinks beer, wine and hard liquor only occasionally per week Current work status  retired Living situation  live with spouse Marital status  married No history of drug/alcohol rehab  Not under pain contract  Tobacco / smoke exposure  05/17/2016: no Tobacco use  Former smoker. 05/17/2016: smoke(d) 2 pack(s) per day uses less than 1/2 can(s) smokeless per week  Medication History Tylenol (Oral) Specific strength unknown - Active. Aspirin (81MG  Tablet, 1 (one) Oral) Active. Cholecalciferol (5000UNIT Tablet, Oral) Active. Clopidogrel Bisulfate (75MG  Tablet, Oral) Active. Benadryl (25MG  Tablet, Oral as needed) Active. Estradiol Specific strength unknown - Active. (Vaginal Cream) Hydroxychloroquine Sulfate (200MG  Tablet, Oral) Active. Lysine (500MG  Tablet, Oral) Active. Metoprolol Tartrate (25MG  Tablet, Oral) Active. Detrol (2MG  Tablet, Oral) Active. Venlafaxine HCl ER (75MG  Capsule ER 24HR, Oral) Active. Atorvastatin Calcium (80MG  Tablet, Oral) Active.  Past Surgical History Carpal Tunnel Repair  bilateral Cesarean Delivery  1 time Heart Stents  Two Hysterectomy  complete (cancerous) Tonsillectomy  Tubal Ligation   Review of Systems  General Not Present- Chills, Fatigue, Fever, Memory Loss, Night Sweats, Weight Gain and Weight Loss. Skin Present- Bruising. Not Present- Eczema,  Hives, Itching, Lesions and Rash. HEENT Present- Double Vision and Headache. Not Present- Dentures, Hearing Loss, Tinnitus and Visual Loss. Respiratory Present- Allergies. Not Present- Chronic Cough, Coughing up blood, Shortness of breath at rest and Shortness of breath with exertion. Cardiovascular Present- Leg Cramps. Not Present- Chest Pain, Difficulty Breathing Lying Down, Murmur, Palpitations, Racing/skipping heartbeats and Swelling. Gastrointestinal Present- Belching, Heartburn and Indigestion. Not Present- Abdominal Pain, Bloody Stool, Constipation, Diarrhea, Difficulty Swallowing, Jaundice, Loss of appetitie, Nausea and Vomiting. Female Genitourinary Present- Incontinence, Urinary frequency and Urinating at Night. Not Present- Blood in Urine, Discharge, Flank Pain, Painful Urination, Urgency, Urinary Retention and Weak urinary stream. Musculoskeletal Present- Back Pain, Decreased Range of Motion, Joint Pain, Leg Cramps and Spasms. Not Present- Joint Swelling, Morning Stiffness, Muscle Pain and Muscle Weakness. Neurological Present- Headaches, Numbness and Tingling. Not Present- Blackout spells, Difficulty with balance, Dizziness, Paralysis, Tremor and Weakness. Psychiatric Present- Anxiety and Memory Loss. Not Present- Insomnia. Hematology Present- Abnormal bruising (On Blood Thinning Medications) and Easy Bruising.  Vitals Weight: 153 lb Height: 63in Body Surface Area: 1.73 m Body Mass Index: 27.1 kg/m  Pulse: 68 (Regular)  BP: 128/78 (Sitting, Left Arm, Standard)  Physical Exam  General Mental Status -Alert, cooperative and good historian. General Appearance-pleasant, Not in acute distress. Orientation-Oriented X3. Build & Nutrition-Well nourished and Well developed.  Head and Neck Head-normocephalic, atraumatic . Neck Global Assessment - supple, no bruit auscultated on the right, no bruit auscultated on the left.  Eye Vision-Wears corrective  lenses. Pupil - Bilateral-Regular and Round. Motion - Bilateral-EOMI.  Chest and Lung Exam Auscultation Breath sounds - clear at anterior chest wall and clear at posterior chest wall. Adventitious sounds - No Adventitious sounds.  Cardiovascular Auscultation Rhythm - Regular rate and rhythm. Heart Sounds - S1 WNL and S2 WNL. Murmurs & Other Heart  Sounds - Auscultation of the heart reveals - No Murmurs.  Abdomen Palpation/Percussion Tenderness - Abdomen is non-tender to palpation. Rigidity (guarding) - Abdomen is soft. Auscultation Auscultation of the abdomen reveals - Bowel sounds normal.  Female Genitourinary Note: Not done, not pertinent to present illness   Musculoskeletal Note: Her left hip has normal range of motion, but no discomfort. Her left knee shows no effusion. Range of motion of left knee is 0 to 135, but no tenderness or instability. Right hip can be flexed to 90 with no internal rotation, about 10 to 20 of external rotation, 20 abduction. She is having pain with range of motion of the hip. Her right knee shows no effusion. Knee range of motion is 0 to 135, but crepitus on range of motion, no tenderness or instability.  RADIOGRAPHS AP pelvis and lateral of the right hip show bone-on-bone arthritis in that right hip with osteophyte formation. AP and lateral of the right knee showed that the knee has no arthritis.  Assessment & Plan Primary osteoarthritis of right hip (M16.11)  Note:Surgical Plans: Right Total Hip Replacement - Anterior Approach  Disposition: Home with help  PCP: Dr. Kary Kos - Patient has been seen preoperatively, given verbal approval, and felt to be stable for surgery. Cards: Dr. Ubaldo Glassing - Patient has been seen preoperatively and felt to be stable for surgery.  Topical TXA - CAD, MI, Heart Stent  Anesthesia Issues: None  Patient was instructed on what medications to stop prior to surgery.  Signed electronically by Joelene Millin, III PA-C

## 2016-09-05 ENCOUNTER — Other Ambulatory Visit (HOSPITAL_COMMUNITY): Payer: Self-pay | Admitting: Emergency Medicine

## 2016-09-05 NOTE — Patient Instructions (Signed)
[name]  09/05/2016   Your procedure is scheduled on: 09-12-16  Report to [facility] Main  Entrance  Report to admitting at 1:00PM   Call this number if you have problems the morning of surgery [phone]   Remember: ONLY 1 PERSON MAY GO WITH YOU TO SHORT STAY TO GET  READY MORNING OF YOUR SURGERY.  Do not eat food After Midnight. You may have clear liquids from midnight until 930am!     Take these medicines the morning of surgery with A SIP OF WATER: amlodipine(norvasc), metoprolol, venlafaxine(effexor), mucinex as needed. Ranitidine as needed, loratadine(claritin) as needed, tylenol as needed,                                 You may not have any metal on your body including hair pins and              piercings  Do not wear jewelry, make-up, lotions, powders or perfumes, deodorant             Do not wear nail polish.  Do not shave  48 hours prior to surgery.     Do not bring valuables to the hospital. [place].  Contacts, dentures or bridgework may not be worn into surgery.  Leave suitcase in the car. After surgery it may be brought to your room.              Please read over the following fact sheets you were given: _____________________________________________________________________     CLEAR LIQUID DIET   Foods Allowed                                                                     Foods Excluded  Coffee and tea, regular and decaf                             liquids that you cannot  Plain Jell-O in any flavor                                             see through such as: Fruit ices (not with fruit pulp)                                     milk, soups, orange juice  Iced Popsicles                                    All solid food Carbonated beverages, regular and diet  Cranberry, grape and apple juices Sports drinks like Gatorade Lightly seasoned clear  broth or consume(fat free) Sugar, honey syrup  Sample Menu Breakfast                                Lunch                                     Supper Cranberry juice                    Beef broth                            Chicken broth Jell-O                                     Grape juice                           Apple juice Coffee or tea                        Jell-O                                      Popsicle                                                Coffee or tea                        Coffee or tea  _____________________________________________________________________  [facility] - Preparing for Surgery Before surgery, you can play an important role.  Because skin is not sterile, your skin needs to be as free of germs as possible.  You can reduce the number of germs on your skin by washing with CHG (chlorahexidine gluconate) soap before surgery.  CHG is an antiseptic cleaner which kills germs and bonds with the skin to continue killing germs even after washing. Please DO NOT use if you have an allergy to CHG or antibacterial soaps.  If your skin becomes reddened/irritated stop using the CHG and inform your nurse when you arrive at Short Stay. Do not shave (including legs and underarms) for at least 48 hours prior to the first CHG shower.  You may shave your face/neck. Please follow these instructions carefully:  1.  Shower with CHG Soap the night before surgery and the  morning of Surgery.  2.  If you choose to wash your hair, wash your hair first as usual with your  normal  shampoo.  3.  After you shampoo, rinse your hair and body thoroughly to remove the  shampoo.                           4.  Use CHG as you would any other liquid soap.  You can apply chg directly  to the skin and wash  Gently with a scrungie or clean washcloth.  5.  Apply the CHG Soap to your body ONLY FROM THE NECK DOWN.   Do not use on face/ open                           Wound or open  sores. Avoid contact with eyes, ears mouth and genitals (private parts).                       Wash face,  Genitals (private parts) with your normal soap.             6.  Wash thoroughly, paying special attention to the area where your surgery  will be performed.  7.  Thoroughly rinse your body with warm water from the neck down.  8.  DO NOT shower/wash with your normal soap after using and rinsing off  the CHG Soap.                9.  Pat yourself dry with a clean towel.            10.  Wear clean pajamas.            11.  Place clean sheets on your bed the night of your first shower and do not  sleep with pets. Day of Surgery : Do not apply any lotions/deodorants the morning of surgery.  Please wear clean clothes to the hospital/surgery center.  FAILURE TO FOLLOW THESE INSTRUCTIONS MAY RESULT IN THE CANCELLATION OF YOUR SURGERY PATIENT SIGNATURE_________________________________  NURSE SIGNATURE__________________________________  ________________________________________________________________________   [name]  An incentive spirometer is a tool that can help keep your lungs clear and active. This tool measures how well you are filling your lungs with each breath. Taking long deep breaths may help reverse or decrease the chance of developing breathing (pulmonary) problems (especially infection) following:  A long period of time when you are unable to move or be active. BEFORE THE PROCEDURE   If the spirometer includes an indicator to show your best effort, your nurse or respiratory therapist will set it to a desired goal.  If possible, sit up straight or lean slightly forward. Try not to slouch.  Hold the incentive spirometer in an upright position. INSTRUCTIONS FOR USE  1. Sit on the edge of your bed if possible, or sit up as far as you can in bed or on a chair. 2. Hold the incentive spirometer in an upright position. 3. Breathe out normally. 4. Place the mouthpiece  in your mouth and seal your lips tightly around it. 5. Breathe in slowly and as deeply as possible, raising the piston or the ball toward the top of the column. 6. Hold your breath for 3-5 seconds or for as long as possible. Allow the piston or ball to fall to the bottom of the column. 7. Remove the mouthpiece from your mouth and breathe out normally. 8. Rest for a few seconds and repeat Steps 1 through 7 at least 10 times every 1-2 hours when you are awake. Take your time and take a few normal breaths between deep breaths. 9. The spirometer may include an indicator to show your best effort. Use the indicator as a goal to work toward during each repetition. 10. After each set of 10 deep breaths, practice coughing to be sure your lungs are clear. If you have an incision (the cut made at the time of  surgery), support your incision when coughing by placing a pillow or rolled up towels firmly against it. Once you are able to get out of bed, walk around indoors and cough well. You may stop using the incentive spirometer when instructed by your caregiver.  RISKS AND COMPLICATIONS  Take your time so you do not get dizzy or light-headed.  If you are in pain, you may need to take or ask for pain medication before doing incentive spirometry. It is harder to take a deep breath if you are having pain. AFTER USE  Rest and breathe slowly and easily.  It can be helpful to keep track of a log of your progress. Your caregiver can provide you with a simple table to help with this. If you are using the spirometer at home, follow these instructions: [place] IF:   You are having difficultly using the spirometer.  You have trouble using the spirometer as often as instructed.  Your pain medication is not giving enough relief while using the spirometer.  You develop fever of 100.5 F (38.1 C) or higher. SEEK IMMEDIATE MEDICAL CARE IF:   You cough up bloody sputum that had not been present  before.  You develop fever of 102 F (38.9 C) or greater.  You develop worsening pain at or near the incision site. MAKE SURE YOU:   Understand these instructions.  Will watch your condition.  Will get help right away if you are not doing well or get worse. Document Released: 06/11/2006 Document Revised: 04/23/2011 Document Reviewed: 08/12/2006 [employer] Patient Information 2014 [employer], [place].   ________________________________________________________________________  WHAT IS A BLOOD TRANSFUSION? Blood Transfusion Information  A transfusion is the replacement of blood or some of its parts. Blood is made up of multiple cells which provide different functions.  Red blood cells carry oxygen and are used for blood loss replacement.  White blood cells fight against infection.  Platelets control bleeding.  Plasma helps clot blood.  Other blood products are available for specialized needs, such as hemophilia or other clotting disorders. BEFORE THE TRANSFUSION  Who gives blood for transfusions?   Healthy volunteers who are fully evaluated to make sure their blood is safe. This is blood bank blood. Transfusion therapy is the safest it has ever been in the practice of medicine. Before blood is taken from a donor, a complete history is taken to make sure that person has no history of diseases nor engages in risky social behavior (examples are intravenous drug use or sexual activity with multiple partners). The donor's travel history is screened to minimize risk of transmitting infections, such as malaria. The donated blood is tested for signs of infectious diseases, such as HIV and hepatitis. The blood is then tested to be sure it is compatible with you in order to minimize the chance of a transfusion reaction. If you or a relative donates blood, this is often done in anticipation of surgery and is not appropriate for emergency situations. It takes many days to process the donated  blood. RISKS AND COMPLICATIONS Although transfusion therapy is very safe and saves many lives, the main dangers of transfusion include:   Getting an infectious disease.  Developing a transfusion reaction. This is an allergic reaction to something in the blood you were given. Every precaution is taken to prevent this. The decision to have a blood transfusion has been considered carefully by your caregiver before blood is given. Blood is not given unless the benefits outweigh the risks. AFTER THE  TRANSFUSION  Right after receiving a blood transfusion, you will usually feel much better and more energetic. This is especially true if your red blood cells have gotten low (anemic). The transfusion raises the level of the red blood cells which carry oxygen, and this usually causes an energy increase.  The nurse administering the transfusion will monitor you carefully for complications. HOME CARE INSTRUCTIONS  No special instructions are needed after a transfusion. You may find your energy is better. Speak with your caregiver about any limitations on activity for underlying diseases you may have. SEEK MEDICAL CARE IF:   Your condition is not improving after your transfusion.  You develop redness or irritation at the intravenous (IV) site. SEEK IMMEDIATE MEDICAL CARE IF:  Any of the following symptoms occur over the next 12 hours:  Shaking chills.  You have a temperature by mouth above 102 F (38.9 C), not controlled by medicine.  Chest, back, or muscle pain.  People around you feel you are not acting correctly or are confused.  Shortness of breath or difficulty breathing.  Dizziness and fainting.  You get a rash or develop hives.  You have a decrease in urine output.  Your urine turns a dark color or changes to pink, red, or brown. Any of the following symptoms occur over the next 10 days:  You have a temperature by mouth above 102 F (38.9 C), not controlled by  medicine.  Shortness of breath.  Weakness after normal activity.  The white part of the eye turns yellow (jaundice).  You have a decrease in the amount of urine or are urinating less often.  Your urine turns a dark color or changes to pink, red, or brown. Document Released: 01/27/2000 Document Revised: 04/23/2011 Document Reviewed: 09/15/2007 [facility] Patient Information 2014 [place], [place].  _______________________________________________________________________

## 2016-09-05 NOTE — Progress Notes (Signed)
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 Cardiac cath revealed no progression of disease with patent stent in the RCA and LAD. Continue with current therapy. Okay to come off aspirin and Plavix 7 days prior to orthopedic surgery. She is a low risk for surgery from a cardiac standpoint".  LOV and surgical clearance (with negative labs and caridology clear) Dr Kary Kos 08-09-16 epic

## 2016-09-06 ENCOUNTER — Encounter (HOSPITAL_COMMUNITY): Payer: Self-pay

## 2016-09-06 ENCOUNTER — Encounter (HOSPITAL_COMMUNITY)
Admission: RE | Admit: 2016-09-06 | Discharge: 2016-09-06 | Disposition: A | Discharge status: Home / Self Care | Payer: Medicare Other | Source: Ambulatory Visit | Attending: Orthopedic Surgery | Admitting: Orthopedic Surgery

## 2016-09-06 ENCOUNTER — Encounter (INDEPENDENT_AMBULATORY_CARE_PROVIDER_SITE_OTHER): Payer: Self-pay

## 2016-09-06 DIAGNOSIS — Z01818 Encounter for other preprocedural examination: Secondary | ICD-10-CM | POA: Insufficient documentation

## 2016-09-06 DIAGNOSIS — M1611 Unilateral primary osteoarthritis, right hip: Secondary | ICD-10-CM | POA: Diagnosis not present

## 2016-09-06 LAB — CBC
HCT: 40.2 % (ref 36.0–46.0)
HEMOGLOBIN: 13.5 g/dL (ref 12.0–15.0)
MCH: 31.5 pg (ref 26.0–34.0)
MCHC: 33.6 g/dL (ref 30.0–36.0)
MCV: 93.9 fL (ref 78.0–100.0)
Platelets: 235 10*3/uL (ref 150–400)
RBC: 4.28 MIL/uL (ref 3.87–5.11)
RDW: 13.9 % (ref 11.5–15.5)
WBC: 7 10*3/uL (ref 4.0–10.5)

## 2016-09-06 LAB — COMPREHENSIVE METABOLIC PANEL
ALT: 20 U/L (ref 14–54)
AST: 29 U/L (ref 15–41)
Albumin: 4.2 g/dL (ref 3.5–5.0)
Alkaline Phosphatase: 74 U/L (ref 38–126)
Anion gap: 7 (ref 5–15)
BUN: 18 mg/dL (ref 6–20)
CO2: 28 mmol/L (ref 22–32)
Calcium: 9.8 mg/dL (ref 8.9–10.3)
Chloride: 106 mmol/L (ref 101–111)
Creatinine, Ser: 0.83 mg/dL (ref 0.44–1.00)
GFR calc Af Amer: >60 mL/min (ref >60)
GFR calc non Af Amer: >60 mL/min (ref >60)
Glucose, Bld: 83 mg/dL (ref 65–99)
Potassium: 5.5 mmol/L — ABNORMAL HIGH (ref 3.5–5.1)
Sodium: 141 mmol/L (ref 135–145)
Total Bilirubin: 0.2 mg/dL — ABNORMAL LOW (ref 0.3–1.2)
Total Protein: 7.6 g/dL (ref 6.5–8.1)

## 2016-09-06 LAB — PROTIME-INR
INR: 0.99
Prothrombin Time: 13.1 seconds (ref 11.4–15.2)

## 2016-09-06 LAB — SURGICAL PCR SCREEN
MRSA, PCR: NEGATIVE
Staphylococcus aureus: NEGATIVE

## 2016-09-06 LAB — APTT: APTT: 24 s (ref 24–36)

## 2016-09-06 NOTE — Progress Notes (Signed)
EKG 06-08-16 ON CHART

## 2016-09-06 NOTE — Progress Notes (Signed)
CMP result routed via epic to Dr Wynelle Link

## 2016-09-07 LAB — ABO/RH: ABO/RH(D): O POS

## 2016-09-12 ENCOUNTER — Inpatient Hospital Stay (HOSPITAL_COMMUNITY): Payer: Medicare Other

## 2016-09-12 ENCOUNTER — Inpatient Hospital Stay (HOSPITAL_COMMUNITY): Payer: Medicare Other | Admitting: Anesthesiology

## 2016-09-12 ENCOUNTER — Inpatient Hospital Stay (HOSPITAL_COMMUNITY)
Admission: RE | Admit: 2016-09-12 | Discharge: 2016-09-13 | DRG: 470 | Disposition: A | Discharge status: Home Health | Payer: Medicare Other | Source: Ambulatory Visit | Attending: Orthopedic Surgery | Admitting: Orthopedic Surgery

## 2016-09-12 ENCOUNTER — Encounter (HOSPITAL_COMMUNITY)
Admission: RE | Disposition: A | Discharge status: Home Health | Payer: Self-pay | Source: Ambulatory Visit | Attending: Orthopedic Surgery

## 2016-09-12 ENCOUNTER — Encounter (HOSPITAL_COMMUNITY): Payer: Self-pay

## 2016-09-12 DIAGNOSIS — M19041 Primary osteoarthritis, right hand: Secondary | ICD-10-CM | POA: Diagnosis present

## 2016-09-12 DIAGNOSIS — I10 Essential (primary) hypertension: Secondary | ICD-10-CM | POA: Diagnosis present

## 2016-09-12 DIAGNOSIS — M19042 Primary osteoarthritis, left hand: Secondary | ICD-10-CM | POA: Diagnosis present

## 2016-09-12 DIAGNOSIS — Z880 Allergy status to penicillin: Secondary | ICD-10-CM

## 2016-09-12 DIAGNOSIS — M1611 Unilateral primary osteoarthritis, right hip: Principal | ICD-10-CM | POA: Diagnosis present

## 2016-09-12 DIAGNOSIS — I252 Old myocardial infarction: Secondary | ICD-10-CM

## 2016-09-12 DIAGNOSIS — Z8701 Personal history of pneumonia (recurrent): Secondary | ICD-10-CM

## 2016-09-12 DIAGNOSIS — I2510 Atherosclerotic heart disease of native coronary artery without angina pectoris: Secondary | ICD-10-CM | POA: Diagnosis present

## 2016-09-12 DIAGNOSIS — Z881 Allergy status to other antibiotic agents status: Secondary | ICD-10-CM

## 2016-09-12 DIAGNOSIS — Z90710 Acquired absence of both cervix and uterus: Secondary | ICD-10-CM

## 2016-09-12 DIAGNOSIS — M329 Systemic lupus erythematosus, unspecified: Secondary | ICD-10-CM | POA: Diagnosis present

## 2016-09-12 DIAGNOSIS — Z7982 Long term (current) use of aspirin: Secondary | ICD-10-CM

## 2016-09-12 DIAGNOSIS — H547 Unspecified visual loss: Secondary | ICD-10-CM | POA: Diagnosis present

## 2016-09-12 DIAGNOSIS — M25551 Pain in right hip: Secondary | ICD-10-CM | POA: Diagnosis present

## 2016-09-12 DIAGNOSIS — Z888 Allergy status to other drugs, medicaments and biological substances status: Secondary | ICD-10-CM

## 2016-09-12 DIAGNOSIS — R32 Unspecified urinary incontinence: Secondary | ICD-10-CM | POA: Diagnosis present

## 2016-09-12 DIAGNOSIS — F419 Anxiety disorder, unspecified: Secondary | ICD-10-CM | POA: Diagnosis present

## 2016-09-12 DIAGNOSIS — G629 Polyneuropathy, unspecified: Secondary | ICD-10-CM | POA: Diagnosis present

## 2016-09-12 DIAGNOSIS — Z882 Allergy status to sulfonamides status: Secondary | ICD-10-CM | POA: Diagnosis not present

## 2016-09-12 DIAGNOSIS — M65341 Trigger finger, right ring finger: Secondary | ICD-10-CM | POA: Diagnosis present

## 2016-09-12 DIAGNOSIS — Z7902 Long term (current) use of antithrombotics/antiplatelets: Secondary | ICD-10-CM | POA: Diagnosis not present

## 2016-09-12 DIAGNOSIS — Z87891 Personal history of nicotine dependence: Secondary | ICD-10-CM | POA: Diagnosis not present

## 2016-09-12 DIAGNOSIS — I7300 Raynaud's syndrome without gangrene: Secondary | ICD-10-CM | POA: Diagnosis present

## 2016-09-12 DIAGNOSIS — Z886 Allergy status to analgesic agent status: Secondary | ICD-10-CM | POA: Diagnosis not present

## 2016-09-12 DIAGNOSIS — Z87440 Personal history of urinary (tract) infections: Secondary | ICD-10-CM | POA: Diagnosis not present

## 2016-09-12 DIAGNOSIS — Z955 Presence of coronary angioplasty implant and graft: Secondary | ICD-10-CM

## 2016-09-12 DIAGNOSIS — Z96649 Presence of unspecified artificial hip joint: Secondary | ICD-10-CM

## 2016-09-12 DIAGNOSIS — L309 Dermatitis, unspecified: Secondary | ICD-10-CM | POA: Diagnosis present

## 2016-09-12 DIAGNOSIS — M169 Osteoarthritis of hip, unspecified: Secondary | ICD-10-CM | POA: Diagnosis present

## 2016-09-12 HISTORY — PX: TOTAL HIP ARTHROPLASTY: SHX124

## 2016-09-12 LAB — TYPE AND SCREEN
ABO/RH(D): O POS
ANTIBODY SCREEN: NEGATIVE

## 2016-09-12 SURGERY — ARTHROPLASTY, HIP, TOTAL, ANTERIOR APPROACH
Anesthesia: Spinal | Site: Hip | Laterality: Right

## 2016-09-12 MED ORDER — METHOCARBAMOL 1000 MG/10ML IJ SOLN
500.0000 mg | Freq: Four times a day (QID) | INTRAVENOUS | Status: DC | PRN
  Filled 2016-09-12: qty 5

## 2016-09-12 MED ORDER — LACTATED RINGERS IV SOLN
INTRAVENOUS | Status: DC
  Administered 2016-09-12 (×3): via INTRAVENOUS

## 2016-09-12 MED ORDER — MIDAZOLAM HCL 5 MG/5ML IJ SOLN
INTRAMUSCULAR | Status: DC | PRN
  Administered 2016-09-12: 2 mg via INTRAVENOUS

## 2016-09-12 MED ORDER — BISACODYL 10 MG RE SUPP: 10.0000 mg | Freq: Every day | RECTAL | Status: DC | PRN

## 2016-09-12 MED ORDER — ACETAMINOPHEN 10 MG/ML IV SOLN
1000.0000 mg | Freq: Once | INTRAVENOUS | Status: AC
  Administered 2016-09-12: 1000 mg via INTRAVENOUS

## 2016-09-12 MED ORDER — TRANEXAMIC ACID 1000 MG/10ML IV SOLN
INTRAVENOUS | Status: AC | PRN
  Administered 2016-09-12: 2000 mg via TOPICAL

## 2016-09-12 MED ORDER — DIPHENHYDRAMINE HCL 12.5 MG/5ML PO ELIX: 12.5000 mg | ORAL_SOLUTION | ORAL | Status: DC | PRN

## 2016-09-12 MED ORDER — BUPIVACAINE HCL (PF) 0.25 % IJ SOLN
INTRAMUSCULAR | Status: DC | PRN
  Administered 2016-09-12: 30 mL

## 2016-09-12 MED ORDER — VANCOMYCIN HCL IN DEXTROSE 1-5 GM/200ML-% IV SOLN
INTRAVENOUS | Status: AC
  Administered 2016-09-12: 1000 mg via INTRAVENOUS
  Filled 2016-09-12: qty 200

## 2016-09-12 MED ORDER — PHENYLEPHRINE HCL 10 MG/ML IJ SOLN
INTRAMUSCULAR | Status: DC | PRN
  Administered 2016-09-12: 50 ug/min via INTRAVENOUS

## 2016-09-12 MED ORDER — PROPOFOL 500 MG/50ML IV EMUL
INTRAVENOUS | Status: DC | PRN
  Administered 2016-09-12: 75 ug/kg/min via INTRAVENOUS

## 2016-09-12 MED ORDER — ONDANSETRON HCL 4 MG/2ML IJ SOLN: 4.0000 mg | Freq: Four times a day (QID) | INTRAMUSCULAR | Status: DC | PRN

## 2016-09-12 MED ORDER — METOCLOPRAMIDE HCL 5 MG/ML IJ SOLN: 5.0000 mg | Freq: Three times a day (TID) | INTRAMUSCULAR | Status: DC | PRN

## 2016-09-12 MED ORDER — DOCUSATE SODIUM 100 MG PO CAPS
100.0000 mg | ORAL_CAPSULE | Freq: Two times a day (BID) | ORAL | Status: DC
  Administered 2016-09-12 – 2016-09-13 (×2): 100 mg via ORAL
  Filled 2016-09-12 (×2): qty 1

## 2016-09-12 MED ORDER — BUPIVACAINE IN DEXTROSE 0.75-8.25 % IT SOLN
INTRATHECAL | Status: DC | PRN
  Administered 2016-09-12: 2 mL via INTRATHECAL

## 2016-09-12 MED ORDER — POLYETHYLENE GLYCOL 3350 17 G PO PACK
17.0000 g | PACK | Freq: Every day | ORAL | Status: DC | PRN
  Administered 2016-09-13: 10:00:00 17 g via ORAL

## 2016-09-12 MED ORDER — METOPROLOL TARTRATE 12.5 MG HALF TABLET
12.5000 mg | ORAL_TABLET | Freq: Two times a day (BID) | ORAL | Status: DC
  Administered 2016-09-12 – 2016-09-13 (×2): 12.5 mg via ORAL
  Filled 2016-09-12 (×2): qty 1

## 2016-09-12 MED ORDER — CLOPIDOGREL BISULFATE 75 MG PO TABS
75.0000 mg | ORAL_TABLET | Freq: Every day | ORAL | Status: DC
  Administered 2016-09-13: 10:00:00 75 mg via ORAL
  Filled 2016-09-12: qty 1

## 2016-09-12 MED ORDER — AMLODIPINE BESYLATE 5 MG PO TABS: 2.5000 mg | ORAL_TABLET | Freq: Every day | ORAL | Status: DC

## 2016-09-12 MED ORDER — DEXAMETHASONE SODIUM PHOSPHATE 10 MG/ML IJ SOLN
INTRAMUSCULAR | Status: AC
  Filled 2016-09-12: qty 1

## 2016-09-12 MED ORDER — LORATADINE 10 MG PO TABS: 10.0000 mg | ORAL_TABLET | Freq: Every day | ORAL | Status: DC | PRN

## 2016-09-12 MED ORDER — DEXAMETHASONE SODIUM PHOSPHATE 10 MG/ML IJ SOLN
10.0000 mg | Freq: Once | INTRAMUSCULAR | Status: AC
  Administered 2016-09-12: 10 mg via INTRAVENOUS

## 2016-09-12 MED ORDER — FENTANYL CITRATE (PF) 100 MCG/2ML IJ SOLN
INTRAMUSCULAR | Status: AC
  Filled 2016-09-12: qty 2

## 2016-09-12 MED ORDER — METOCLOPRAMIDE HCL 5 MG PO TABS: 5.0000 mg | ORAL_TABLET | Freq: Three times a day (TID) | ORAL | Status: DC | PRN

## 2016-09-12 MED ORDER — OXYCODONE HCL 5 MG PO TABS
5.0000 mg | ORAL_TABLET | ORAL | Status: DC | PRN
  Administered 2016-09-12 – 2016-09-13 (×5): 5 mg via ORAL
  Administered 2016-09-13: 10 mg via ORAL
  Filled 2016-09-12 (×2): qty 2
  Filled 2016-09-12 (×5): qty 1

## 2016-09-12 MED ORDER — TRANEXAMIC ACID 1000 MG/10ML IV SOLN
2000.0000 mg | Freq: Once | INTRAVENOUS | Status: DC
  Filled 2016-09-12: qty 20

## 2016-09-12 MED ORDER — ACETAMINOPHEN 10 MG/ML IV SOLN
INTRAVENOUS | Status: AC
  Filled 2016-09-12: qty 100

## 2016-09-12 MED ORDER — HYDROMORPHONE HCL-NACL 0.5-0.9 MG/ML-% IV SOSY: 0.2500 mg | PREFILLED_SYRINGE | INTRAVENOUS | Status: DC | PRN

## 2016-09-12 MED ORDER — MIDAZOLAM HCL 2 MG/2ML IJ SOLN
INTRAMUSCULAR | Status: AC
  Filled 2016-09-12: qty 2

## 2016-09-12 MED ORDER — PHENOL 1.4 % MT LIQD: 1.0000 | OROMUCOSAL | Status: DC | PRN

## 2016-09-12 MED ORDER — ONDANSETRON HCL 4 MG PO TABS: 4.0000 mg | ORAL_TABLET | Freq: Four times a day (QID) | ORAL | Status: DC | PRN

## 2016-09-12 MED ORDER — SODIUM CHLORIDE 0.9 % IV SOLN
INTRAVENOUS | Status: DC
  Administered 2016-09-12: 18:00:00 via INTRAVENOUS

## 2016-09-12 MED ORDER — FENTANYL CITRATE (PF) 100 MCG/2ML IJ SOLN
INTRAMUSCULAR | Status: DC | PRN
  Administered 2016-09-12: 50 ug via INTRAVENOUS

## 2016-09-12 MED ORDER — DIPHENHYDRAMINE HCL 25 MG PO CAPS
50.0000 mg | ORAL_CAPSULE | Freq: Once | ORAL | Status: AC
  Administered 2016-09-13: 50 mg via ORAL
  Filled 2016-09-12: qty 2

## 2016-09-12 MED ORDER — ASPIRIN EC 325 MG PO TBEC
325.0000 mg | DELAYED_RELEASE_TABLET | Freq: Every day | ORAL | Status: DC
  Administered 2016-09-13: 325 mg via ORAL
  Filled 2016-09-12 (×2): qty 1

## 2016-09-12 MED ORDER — PROMETHAZINE HCL 25 MG/ML IJ SOLN: 6.2500 mg | INTRAMUSCULAR | Status: DC | PRN

## 2016-09-12 MED ORDER — BUPIVACAINE HCL (PF) 0.25 % IJ SOLN
INTRAMUSCULAR | Status: AC
  Filled 2016-09-12: qty 30

## 2016-09-12 MED ORDER — METHOCARBAMOL 500 MG PO TABS
500.0000 mg | ORAL_TABLET | Freq: Four times a day (QID) | ORAL | Status: DC | PRN
  Administered 2016-09-12 – 2016-09-13 (×3): 500 mg via ORAL
  Filled 2016-09-12 (×3): qty 1

## 2016-09-12 MED ORDER — CHLORHEXIDINE GLUCONATE 4 % EX LIQD: 60.0000 mL | Freq: Once | CUTANEOUS | Status: DC

## 2016-09-12 MED ORDER — ACETAMINOPHEN 650 MG RE SUPP: 650.0000 mg | Freq: Four times a day (QID) | RECTAL | Status: DC | PRN

## 2016-09-12 MED ORDER — OXYBUTYNIN CHLORIDE ER 5 MG PO TB24: 10.0000 mg | ORAL_TABLET | Freq: Every day | ORAL | Status: DC

## 2016-09-12 MED ORDER — FAMOTIDINE 20 MG PO TABS: 20.0000 mg | ORAL_TABLET | Freq: Every day | ORAL | Status: DC | PRN

## 2016-09-12 MED ORDER — VENLAFAXINE HCL ER 75 MG PO CP24
75.0000 mg | ORAL_CAPSULE | Freq: Every day | ORAL | Status: DC
  Administered 2016-09-13: 75 mg via ORAL
  Filled 2016-09-12: qty 1

## 2016-09-12 MED ORDER — DIPHENHYDRAMINE HCL 25 MG PO CAPS
50.0000 mg | ORAL_CAPSULE | Freq: Once | ORAL | Status: AC
  Administered 2016-09-12: 50 mg via ORAL
  Filled 2016-09-12 (×3): qty 2

## 2016-09-12 MED ORDER — PHENYLEPHRINE 40 MCG/ML (10ML) SYRINGE FOR IV PUSH (FOR BLOOD PRESSURE SUPPORT)
PREFILLED_SYRINGE | INTRAVENOUS | Status: AC
  Filled 2016-09-12: qty 10

## 2016-09-12 MED ORDER — VANCOMYCIN HCL IN DEXTROSE 1-5 GM/200ML-% IV SOLN
1000.0000 mg | Freq: Two times a day (BID) | INTRAVENOUS | Status: AC
  Administered 2016-09-13: 02:00:00 1000 mg via INTRAVENOUS
  Filled 2016-09-12: qty 200

## 2016-09-12 MED ORDER — ONDANSETRON HCL 4 MG/2ML IJ SOLN
INTRAMUSCULAR | Status: AC
  Filled 2016-09-12: qty 2

## 2016-09-12 MED ORDER — ROSUVASTATIN CALCIUM 5 MG PO TABS
5.0000 mg | ORAL_TABLET | Freq: Every day | ORAL | Status: DC
  Administered 2016-09-12: 5 mg via ORAL
  Filled 2016-09-12: qty 1

## 2016-09-12 MED ORDER — MENTHOL 3 MG MT LOZG: 1.0000 | LOZENGE | OROMUCOSAL | Status: DC | PRN

## 2016-09-12 MED ORDER — ACETAMINOPHEN 500 MG PO TABS
1000.0000 mg | ORAL_TABLET | Freq: Four times a day (QID) | ORAL | Status: DC
  Administered 2016-09-12 – 2016-09-13 (×3): 1000 mg via ORAL
  Filled 2016-09-12 (×3): qty 2

## 2016-09-12 MED ORDER — ACETAMINOPHEN 325 MG PO TABS: 650.0000 mg | ORAL_TABLET | Freq: Four times a day (QID) | ORAL | Status: DC | PRN

## 2016-09-12 MED ORDER — MORPHINE SULFATE (PF) 4 MG/ML IV SOLN
1.0000 mg | INTRAVENOUS | Status: DC | PRN
  Administered 2016-09-12 – 2016-09-13 (×2): 1 mg via INTRAVENOUS
  Filled 2016-09-12 (×2): qty 1

## 2016-09-12 MED ORDER — PROPOFOL 10 MG/ML IV BOLUS
INTRAVENOUS | Status: AC
  Filled 2016-09-12: qty 60

## 2016-09-12 MED ORDER — GUAIFENESIN ER 600 MG PO TB12: 1200.0000 mg | ORAL_TABLET | Freq: Every day | ORAL | Status: DC | PRN

## 2016-09-12 MED ORDER — DEXAMETHASONE SODIUM PHOSPHATE 10 MG/ML IJ SOLN
10.0000 mg | Freq: Once | INTRAMUSCULAR | Status: AC
  Administered 2016-09-13: 10:00:00 10 mg via INTRAVENOUS
  Filled 2016-09-12: qty 1

## 2016-09-12 MED ORDER — TRAMADOL HCL 50 MG PO TABS
50.0000 mg | ORAL_TABLET | Freq: Four times a day (QID) | ORAL | Status: DC | PRN
  Administered 2016-09-12: 20:00:00 100 mg via ORAL
  Filled 2016-09-12: qty 2

## 2016-09-12 MED ORDER — FLEET ENEMA 7-19 GM/118ML RE ENEM: 1.0000 | ENEMA | Freq: Once | RECTAL | Status: DC | PRN

## 2016-09-12 MED ORDER — VANCOMYCIN HCL IN DEXTROSE 1-5 GM/200ML-% IV SOLN
1000.0000 mg | INTRAVENOUS | Status: AC
  Administered 2016-09-12: 1000 mg via INTRAVENOUS

## 2016-09-12 MED ORDER — ONDANSETRON HCL 4 MG/2ML IJ SOLN
INTRAMUSCULAR | Status: DC | PRN
  Administered 2016-09-12: 4 mg via INTRAVENOUS

## 2016-09-12 SURGICAL SUPPLY — 37 items
BAG DECANTER FOR FLEXI CONT (MISCELLANEOUS) ×3 IMPLANT
BAG ZIPLOCK 12X15 (MISCELLANEOUS) IMPLANT
BLADE SAG 18X100X1.27 (BLADE) ×3 IMPLANT
CAPT HIP TOTAL 2 ×3 IMPLANT
CLOSURE WOUND 1/2 X4 (GAUZE/BANDAGES/DRESSINGS) ×1
CLOTH BEACON ORANGE TIMEOUT ST (SAFETY) ×3 IMPLANT
COVER PERINEAL POST (MISCELLANEOUS) ×3 IMPLANT
COVER SURGICAL LIGHT HANDLE (MISCELLANEOUS) ×3 IMPLANT
DECANTER SPIKE VIAL GLASS SM (MISCELLANEOUS) ×3 IMPLANT
DRAPE STERI IOBAN 125X83 (DRAPES) ×3 IMPLANT
DRAPE U-SHAPE 47X51 STRL (DRAPES) ×6 IMPLANT
DRSG ADAPTIC 3X8 NADH LF (GAUZE/BANDAGES/DRESSINGS) ×3 IMPLANT
DRSG MEPILEX BORDER 4X4 (GAUZE/BANDAGES/DRESSINGS) ×3 IMPLANT
DRSG MEPILEX BORDER 4X8 (GAUZE/BANDAGES/DRESSINGS) ×3 IMPLANT
DURAPREP 26ML APPLICATOR (WOUND CARE) ×3 IMPLANT
ELECT REM PT RETURN 15FT ADLT (MISCELLANEOUS) ×3 IMPLANT
EVACUATOR 1/8 PVC DRAIN (DRAIN) ×3 IMPLANT
GLOVE BIO SURGEON STRL SZ7.5 (GLOVE) ×3 IMPLANT
GLOVE BIO SURGEON STRL SZ8 (GLOVE) ×6 IMPLANT
GLOVE BIOGEL PI IND STRL 8 (GLOVE) ×2 IMPLANT
GLOVE BIOGEL PI INDICATOR 8 (GLOVE) ×4
GOWN STRL REUS W/TWL LRG LVL3 (GOWN DISPOSABLE) ×3 IMPLANT
GOWN STRL REUS W/TWL XL LVL3 (GOWN DISPOSABLE) ×3 IMPLANT
NS IRRIG 1000ML POUR BTL (IV SOLUTION) ×3 IMPLANT
PACK ANTERIOR HIP CUSTOM (KITS) ×3 IMPLANT
STRIP CLOSURE SKIN 1/2X4 (GAUZE/BANDAGES/DRESSINGS) ×2 IMPLANT
SUT ETHIBOND NAB CT1 #1 30IN (SUTURE) ×6 IMPLANT
SUT MNCRL AB 4-0 PS2 18 (SUTURE) ×3 IMPLANT
SUT STRATAFIX 0 PDS 27 VIOLET (SUTURE) ×3
SUT VIC AB 2-0 CT1 27 (SUTURE) ×6
SUT VIC AB 2-0 CT1 TAPERPNT 27 (SUTURE) ×3 IMPLANT
SUTURE STRATFX 0 PDS 27 VIOLET (SUTURE) ×1 IMPLANT
SYR 50ML LL SCALE MARK (SYRINGE) IMPLANT
TRAY FOLEY CATH SILVER 16FR LF (SET/KITS/TRAYS/PACK) ×3 IMPLANT
TRAY FOLEY W/METER SILVER 16FR (SET/KITS/TRAYS/PACK) IMPLANT
WATER STERILE IRR 1000ML POUR (IV SOLUTION) ×6 IMPLANT
YANKAUER SUCT BULB TIP 10FT TU (MISCELLANEOUS) ×3 IMPLANT

## 2016-09-12 NOTE — Op Note (Signed)
OPERATIVE REPORT- TOTAL HIP ARTHROPLASTY   PREOPERATIVE DIAGNOSIS: Osteoarthritis of the Right hip.   POSTOPERATIVE DIAGNOSIS: Osteoarthritis of the Right  hip.   PROCEDURE: Right total hip arthroplasty, anterior approach.   SURGEON: Gaynelle Arabian, MD   ASSISTANT: Youlanda Mighty PA-S  ANESTHESIA:  Spinal  EBL- 400 ml  DRAINS: Hemovac x1.   COMPLICATIONS: None   CONDITION: PACU - hemodynamically stable.   BRIEF CLINICAL NOTE: [name] is a 66 y.o. female who has advanced end-  stage arthritis of their Right  hip with progressively worsening pain and  dysfunction.The patient has failed nonoperative management and presents for  total hip arthroplasty.   PROCEDURE IN DETAIL: After successful administration of spinal  anesthetic, the traction boots for the [facility] bed were placed on both  feet and the patient was placed onto the [facility] bed, boots placed into the leg  holders. The Right hip was then isolated from the perineum with plastic  drapes and prepped and draped in the usual sterile fashion. ASIS and  greater trochanter were marked and a oblique incision was made, starting  at about 1 cm lateral and 2 cm distal to the ASIS and coursing towards  the anterior cortex of the femur. The skin was cut with a 10 blade  through subcutaneous tissue to the level of the fascia overlying the  tensor fascia lata muscle. The fascia was then incised in line with the  incision at the junction of the anterior third and posterior 2/3rd. The  muscle was teased off the fascia and then the interval between the TFL  and the rectus was developed. The Hohmann retractor was then placed at  the top of the femoral neck over the capsule. The vessels overlying the  capsule were cauterized and the fat on top of the capsule was removed.  A Hohmann retractor was then placed anterior underneath the rectus  femoris to give exposure to the entire anterior capsule. A T-shaped  capsulotomy was  performed. The edges were tagged and the femoral head  was identified.       Osteophytes are removed off the superior acetabulum.  The femoral neck was then cut in situ with an oscillating saw. Traction  was then applied to the left lower extremity utilizing the [facility]  traction. The femoral head was then removed. Retractors were placed  around the acetabulum and then circumferential removal of the labrum was  performed. Osteophytes were also removed. Reaming starts at 45 mm to  medialize and  Increased in 2 mm increments to 47 mm. We reamed in  approximately 40 degrees of abduction, 20 degrees anteversion. A 48 mm  pinnacle acetabular shell was then impacted in anatomic position under  fluoroscopic guidance with excellent purchase. We did not need to place  any additional dome screws. A 28 mm neutral + 4 marathon liner was then  placed into the acetabular shell.       The femoral lift was then placed along the lateral aspect of the femur  just distal to the vastus ridge. The leg was  externally rotated and capsule  was stripped off the inferior aspect of the femoral neck down to the  level of the lesser trochanter, this was done with electrocautery. The femur was lifted after this was performed. The  leg was then placed in an extended and adducted position essentially delivering the femur. We also removed the capsule superiorly and the piriformis from the piriformis fossa to gain excellent  exposure of the  proximal femur. Rongeur was used to remove some cancellous bone to get  into the lateral portion of the proximal femur for placement of the  initial starter reamer. The starter broaches was placed  the starter broach  and was shown to go down the center of the canal. Broaching  with the  Corail system was then performed starting at size 8, coursing  Up to size 10. A size 10 had excellent torsional and rotational  and axial stability. The trial high offset neck was then placed  with a 28 +  1.5 trial head. The hip was then reduced. We confirmed that  the stem was in the canal both on AP and lateral x-rays. It also has excellent sizing. The hip was reduced with outstanding stability through full extension and full external rotation.. AP pelvis was taken and the leg lengths were measured and found to be equal. Hip was then dislocated again and the femoral head and neck removed. The  femoral broach was removed. Size 10 Corail stem with a high offset  neck was then impacted into the femur following native anteversion. Has  excellent purchase in the canal. Excellent torsional and rotational and  axial stability. It is confirmed to be in the canal on AP and lateral  fluoroscopic views. The 28 + 1.5 ceramic head was placed and the hip  reduced with outstanding stability. Again AP pelvis was taken and it  confirmed that the leg lengths were equal. The wound was then copiously  irrigated with saline solution and the capsule reattached and repaired  with Ethibond suture. 30 ml of .25% Bupivicaine was  injected into the capsule and into the edge of the tensor fascia lata as well as subcutaneous tissue. The fascia overlying the tensor fascia lata was then closed with a running #1 V-Loc. Subcu was closed with interrupted 2-0 Vicryl and subcuticular running 4-0 Monocryl. Incision was cleaned  and dried. Steri-Strips and a bulky sterile dressing applied. Hemovac  drain was hooked to suction and then the patient was awakened and transported to  recovery in stable condition.        Please note that a surgical assistant was a medical necessity for this procedure to perform it in a safe and expeditious manner. Assistant was necessary to provide appropriate retraction of vital neurovascular structures and to prevent femoral fracture and allow for anatomic placement of the prosthesis.  Gaynelle Arabian, M.D.

## 2016-09-12 NOTE — Interval H&P Note (Signed)
History and Physical Interval Note:  09/12/2016 1:16 PM  [name]  has presented today for surgery, with the diagnosis of Osteoarthritis Right hip   The various methods of treatment have been discussed with the patient and family. After consideration of risks, benefits and other options for treatment, the patient has consented to  Procedure(s): RIGHT TOTAL HIP ARTHROPLASTY ANTERIOR APPROACH (Right) as a surgical intervention .  The patient's history has been reviewed, patient examined, no change in status, stable for surgery.  I have reviewed the patient's chart and labs.  Questions were answered to the patient's satisfaction.     Gearlean Alf

## 2016-09-12 NOTE — H&P (View-Only) (Signed)
Tran Randle Wassenaar 08/23/2016 11:15 AM Location: [facility] Patient #: [id number] DOB: 1951-02-02 Married / Language: Cleophus Molt / Race: White Female   History of Present Illness (Calypso Hagarty L. Deania Siguenza III PA-C; 08/23/2016 11:54 AM) The patient is a 66 year old female who comes in today for a preoperative History and Physical. The patient is scheduled for a right total hip arthroplasty (anterior) to be performed by Dr. Dione Plover. Aluisio, MD at [facility] on 09/12/2016. The patient is a 66 year old female who presented for follow up of their hip. The patient is being followed for their right hip pain and osteoarthritis. They are months out from intra-articular injection. Symptoms reported include: pain, aching, catching, difficulty ambulating and difficulty arising from chair. The patient feels that they are doing poorly and report their pain level to be mild to moderate. Current treatment includes: activity modification. The following medication has been used for pain control: Tylenol (Arthritis strength). The patient has reported some temporay improvement of their symptoms with the Cortisone injection but it only helped some. She still has good and bad days, depending on the weather and activity. She is concerned with the need for antibiotics due to surgery. She states that she is allergic to many antibiotics. She does know that she is able to take vancomycin. She has been seen by an allergist and has had testing done preoperatively and has recommendations regarding her preoperative antibiotic dosing. The hip injection provided minimal benefit. It helped for a couple of days and then the pain came back badly. She is hurting at all times. It is limiting what she can and cannot do. She has been woken up at night with the hip pain. She has reached a point where she wants to go ahead and proceed with surgical intervention. They have been treated conservatively in the past for the above stated problem  and despite conservative measures, they continue to have progressive pain and severe functional limitations and dysfunction. They have failed non-operative management including home exercise, activity modification, medications, and injections. It is felt that they would benefit from undergoing total joint replacement. Risks and benefits of the procedure have been discussed with the patient and they elect to proceed with surgery. There are no active contraindications to surgery such as ongoing infection or rapidly progressive neurological disease.    Problem List/Past Medical  Trigger ring finger of right hand (M65.341)  Primary osteoarthritis of both hands (M19.041, M19.042)  Primary osteoarthritis of right hip (M16.11)  Impaired Vision  Anxiety Disorder  Peripheral Neuropathy  Pneumonia  Hypertension  Myocardial infarction  Date: 12/2015 Coronary Artery Disease/Heart Disease  Stent  Two Urinary Incontinence  Urinary Tract Infection  Autoimmune disorder  Lupus Systemic Lupus Erythematosus  Eczema  Raynaud's Syndrome  Disease   Allergies Sulfanomides  HEART ATTACK, Low Blood Pressure, Chills Cipro *FLUOROQUINOLONES*  Aniexty Clindamycin Palmitate HCl *ANTI-INFECTIVE AGENTS - MISC.*  Dizziness. Light Headedness Rocephin *CEPHALOSPORINS*  Penicillin G Pot in Dextrose *PENICILLINS*  "Patient is able to take amoxicillin, oxacillin, methicillin. She had a negative allergy testing to penicillin (2016) and passed an amoxicillin challenge (07/2016)". Dr. Okey Dupre Cephalexin *CEPHALOSPORINS*  Cephalosporins  "She has a history of different reactions to this class of drugs, which we will address in the future. She should continue to avoid them for now." Dr. Okey Dupre. Biaxin *MACROLIDES*  GI pain, Jittery Doxycycline Hyclate *TETRACYCLINES*  Jittery Levaquin *FLUOROQUINOLONES*  Unknown Motrin *ANALGESICS - ANTI-INFLAMMATORY*  In High Doses Cefuroxime  Sodium *CEPHALOSPORINS*  Anxiety  Vancomycin HCl *ANTI-INFECTIVE AGENTS - MISC.*  "She had a negavtive allergy testing to vancomycin (2016). 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 Please observe the following precautions: A) Please pretreat patient with Benadryl 50 mg prior to dosing B) Run in the infusion at a slower rate C) Run the infusion over at least 2 hours or longer  Family History Father  Deceased, Heart disease, Cancer, Nephrolithiasis. age 42 Mother  Deceased, Heart disease, Angina Pectoris. age 33  Social History  Children  1 Current drinker  05/17/2016: Currently drinks beer, wine and hard liquor only occasionally per week Current work status  retired Living situation  live with spouse Marital status  married No history of drug/alcohol rehab  Not under pain contract  Tobacco / smoke exposure  05/17/2016: no Tobacco use  Former smoker. 05/17/2016: smoke(d) 2 pack(s) per day uses less than 1/2 can(s) smokeless per week  Medication History Tylenol (Oral) Specific strength unknown - Active. Aspirin (81MG  Tablet, 1 (one) Oral) Active. Cholecalciferol (5000UNIT Tablet, Oral) Active. Clopidogrel Bisulfate (75MG  Tablet, Oral) Active. Benadryl (25MG  Tablet, Oral as needed) Active. Estradiol Specific strength unknown - Active. (Vaginal Cream) Hydroxychloroquine Sulfate (200MG  Tablet, Oral) Active. Lysine (500MG  Tablet, Oral) Active. Metoprolol Tartrate (25MG  Tablet, Oral) Active. Detrol (2MG  Tablet, Oral) Active. Venlafaxine HCl ER (75MG  Capsule ER 24HR, Oral) Active. Atorvastatin Calcium (80MG  Tablet, Oral) Active.  Past Surgical History Carpal Tunnel Repair  bilateral Cesarean Delivery  1 time Heart Stents  Two Hysterectomy  complete (cancerous) Tonsillectomy  Tubal Ligation   Review of Systems  General Not Present- Chills, Fatigue, Fever, Memory Loss, Night Sweats, Weight Gain and Weight Loss. Skin Present- Bruising. Not Present- Eczema,  Hives, Itching, Lesions and Rash. HEENT Present- Double Vision and Headache. Not Present- Dentures, Hearing Loss, Tinnitus and Visual Loss. Respiratory Present- Allergies. Not Present- Chronic Cough, Coughing up blood, Shortness of breath at rest and Shortness of breath with exertion. Cardiovascular Present- Leg Cramps. Not Present- Chest Pain, Difficulty Breathing Lying Down, Murmur, Palpitations, Racing/skipping heartbeats and Swelling. Gastrointestinal Present- Belching, Heartburn and Indigestion. Not Present- Abdominal Pain, Bloody Stool, Constipation, Diarrhea, Difficulty Swallowing, Jaundice, Loss of appetitie, Nausea and Vomiting. Female Genitourinary Present- Incontinence, Urinary frequency and Urinating at Night. Not Present- Blood in Urine, Discharge, Flank Pain, Painful Urination, Urgency, Urinary Retention and Weak urinary stream. Musculoskeletal Present- Back Pain, Decreased Range of Motion, Joint Pain, Leg Cramps and Spasms. Not Present- Joint Swelling, Morning Stiffness, Muscle Pain and Muscle Weakness. Neurological Present- Headaches, Numbness and Tingling. Not Present- Blackout spells, Difficulty with balance, Dizziness, Paralysis, Tremor and Weakness. Psychiatric Present- Anxiety and Memory Loss. Not Present- Insomnia. Hematology Present- Abnormal bruising (On Blood Thinning Medications) and Easy Bruising.  Vitals Weight: 153 lb Height: 63in Body Surface Area: 1.73 m Body Mass Index: 27.1 kg/m  Pulse: 68 (Regular)  BP: 128/78 (Sitting, Left Arm, Standard)  Physical Exam  General Mental Status -Alert, cooperative and good historian. General Appearance-pleasant, Not in acute distress. Orientation-Oriented X3. Build & Nutrition-Well nourished and Well developed.  Head and Neck Head-normocephalic, atraumatic . Neck Global Assessment - supple, no bruit auscultated on the right, no bruit auscultated on the left.  Eye Vision-Wears corrective  lenses. Pupil - Bilateral-Regular and Round. Motion - Bilateral-EOMI.  Chest and Lung Exam Auscultation Breath sounds - clear at anterior chest wall and clear at posterior chest wall. Adventitious sounds - No Adventitious sounds.  Cardiovascular Auscultation Rhythm - Regular rate and rhythm. Heart Sounds - S1 WNL and S2 WNL. Murmurs & Other Heart  Sounds - Auscultation of the heart reveals - No Murmurs.  Abdomen Palpation/Percussion Tenderness - Abdomen is non-tender to palpation. Rigidity (guarding) - Abdomen is soft. Auscultation Auscultation of the abdomen reveals - Bowel sounds normal.  Female Genitourinary Note: Not done, not pertinent to present illness   Musculoskeletal Note: Her left hip has normal range of motion, but no discomfort. Her left knee shows no effusion. Range of motion of left knee is 0 to 135, but no tenderness or instability. Right hip can be flexed to 90 with no internal rotation, about 10 to 20 of external rotation, 20 abduction. She is having pain with range of motion of the hip. Her right knee shows no effusion. Knee range of motion is 0 to 135, but crepitus on range of motion, no tenderness or instability.  RADIOGRAPHS AP pelvis and lateral of the right hip show bone-on-bone arthritis in that right hip with osteophyte formation. AP and lateral of the right knee showed that the knee has no arthritis.  Assessment & Plan Primary osteoarthritis of right hip (M16.11)  Note:Surgical Plans: Right Total Hip Replacement - Anterior Approach  Disposition: Home with help  PCP: Dr. Kary Kos - Patient has been seen preoperatively, given verbal approval, and felt to be stable for surgery. Cards: Dr. Ubaldo Glassing - Patient has been seen preoperatively and felt to be stable for surgery.  Topical TXA - CAD, MI, Heart Stent  Anesthesia Issues: None  Patient was instructed on what medications to stop prior to surgery.  Signed electronically by Joelene Millin, III PA-C

## 2016-09-12 NOTE — Transfer of Care (Signed)
Immediate Anesthesia Transfer of Care Note  Patient: [name]  Procedure(s) Performed: Procedure(s): RIGHT TOTAL HIP ARTHROPLASTY ANTERIOR APPROACH (Right)  Patient Location: PACU  Anesthesia Type:Spinal  Level of Consciousness: awake, alert  and oriented  Airway & Oxygen Therapy: Patient Spontanous Breathing and Patient connected to face mask oxygen  Post-op Assessment: Report given to RN and Post -op Vital signs reviewed and stable  Post vital signs: Reviewed and stable  Last Vitals:  Vitals:   09/12/16 1250  BP: (!) 147/77  Pulse: 61  Resp: 16  Temp: 36.5 C    Last Pain:  Vitals:   09/12/16 1250  TempSrc: Oral      Patients Stated Pain Goal: 4 (34/74/25 [id number])  Complications: No apparent anesthesia complications

## 2016-09-12 NOTE — Anesthesia Procedure Notes (Signed)
Date/Time: 09/12/2016 3:45 PM Performed by: [place] Jennae Hakeem C Oxygen Delivery Method: Simple face mask

## 2016-09-12 NOTE — Anesthesia Procedure Notes (Signed)
Spinal  Patient location during procedure: OR Start time: 09/12/2016 3:32 PM Staffing Anesthesiologist: Catalina Gravel Resident/CRNA: [place] Eirik Schueler C Performed: resident/CRNA  Preanesthetic Checklist Completed: patient identified, site marked, surgical consent, pre-op evaluation, timeout performed, IV checked, risks and benefits discussed and monitors and equipment checked Spinal Block Patient position: sitting Prep: Betadine Patient monitoring: heart rate, continuous pulse ox and blood pressure Location: L3-4 Injection technique: single-shot Needle Needle type: Pencan  Needle gauge: 25 G Needle length: 9 cm Assessment Sensory level: T6 Additional Notes Expiration date of kit checked and confirmed. Patient tolerated procedure well, without complications.

## 2016-09-12 NOTE — Anesthesia Postprocedure Evaluation (Signed)
Anesthesia Post Note  Patient: Brieana Huebert  Procedure(s) Performed: Procedure(s) (LRB): RIGHT TOTAL HIP ARTHROPLASTY ANTERIOR APPROACH (Right)     Patient location during evaluation: PACU Anesthesia Type: Spinal Level of consciousness: oriented and awake and alert Pain management: pain level controlled Vital Signs Assessment: post-procedure vital signs reviewed and stable Respiratory status: spontaneous breathing, respiratory function stable and patient connected to nasal cannula oxygen Cardiovascular status: blood pressure returned to baseline and stable Postop Assessment: no headache and no backache Anesthetic complications: no    Last Vitals:  Vitals:   09/12/16 1738 09/12/16 1745  BP: (!) 108/92 108/67  Pulse: (!) 56 (!) 55  Resp: 12 13  Temp: 36.5 C     Last Pain:  Vitals:   09/12/16 1250  TempSrc: Oral                 Krishang Reading S

## 2016-09-12 NOTE — Anesthesia Preprocedure Evaluation (Addendum)
Anesthesia Evaluation  Patient identified by MRN, date of birth, ID band Patient awake    Reviewed: Allergy & Precautions, NPO status , Patient's Chart, lab work & pertinent test results  Airway Mallampati: II  TM Distance: >3 FB Neck ROM: Full    Dental no notable dental hx.    Pulmonary neg pulmonary ROS, former smoker,    Pulmonary exam normal breath sounds clear to auscultation       Cardiovascular hypertension, + CAD, + Past MI and + Cardiac Stents  Normal cardiovascular exam Rhythm:Regular Rate:Normal     Neuro/Psych negative neurological ROS  negative psych ROS   GI/Hepatic negative GI ROS, Neg liver ROS,   Endo/Other  negative endocrine ROS  Renal/GU negative Renal ROS  negative genitourinary   Musculoskeletal negative musculoskeletal ROS (+)   Abdominal   Peds negative pediatric ROS (+)  Hematology negative hematology ROS (+)   Anesthesia Other Findings   Reproductive/Obstetrics negative OB ROS                             Anesthesia Physical Anesthesia Plan  ASA: II  Anesthesia Plan: Spinal   Post-op Pain Management:    Induction: Intravenous  PONV Risk Score and Plan: 1 and Ondansetron and Dexamethasone  Airway Management Planned: Simple Face Mask  Additional Equipment:   Intra-op Plan:   Post-operative Plan:   Informed Consent: I have reviewed the patients History and Physical, chart, labs and discussed the procedure including the risks, benefits and alternatives for the proposed anesthesia with the patient or authorized representative who has indicated his/her understanding and acceptance.   Dental advisory given  Plan Discussed with: CRNA and Surgeon  Anesthesia Plan Comments:        Anesthesia Quick Evaluation

## 2016-09-13 LAB — CBC
HEMATOCRIT: 33.7 % — AB (ref 36.0–46.0)
Hemoglobin: 11.3 g/dL — ABNORMAL LOW (ref 12.0–15.0)
MCH: 31.3 pg (ref 26.0–34.0)
MCHC: 33.5 g/dL (ref 30.0–36.0)
MCV: 93.4 fL (ref 78.0–100.0)
PLATELETS: 216 10*3/uL (ref 150–400)
RBC: 3.61 MIL/uL — ABNORMAL LOW (ref 3.87–5.11)
RDW: 13.7 % (ref 11.5–15.5)
WBC: 15.4 10*3/uL — AB (ref 4.0–10.5)

## 2016-09-13 LAB — BASIC METABOLIC PANEL
ANION GAP: 10 (ref 5–15)
Anion gap: 7 (ref 5–15)
BUN: 13 mg/dL (ref 6–20)
BUN: 15 mg/dL (ref 6–20)
CALCIUM: 9.3 mg/dL (ref 8.9–10.3)
CALCIUM: 9.3 mg/dL (ref 8.9–10.3)
CO2: 26 mmol/L (ref 22–32)
CO2: 23 mmol/L (ref 22–32)
CREATININE: 0.83 mg/dL (ref 0.44–1.00)
Chloride: 105 mmol/L (ref 101–111)
Chloride: 106 mmol/L (ref 101–111)
Creatinine, Ser: 0.86 mg/dL (ref 0.44–1.00)
GFR calc Af Amer: >60 mL/min (ref >60)
GFR calc Af Amer: >60 mL/min (ref >60)
GLUCOSE: 149 mg/dL — AB (ref 65–99)
GLUCOSE: 155 mg/dL — AB (ref 65–99)
Potassium: 5.2 mmol/L — ABNORMAL HIGH (ref 3.5–5.1)
Potassium: 4.4 mmol/L (ref 3.5–5.1)
Sodium: 138 mmol/L (ref 135–145)
Sodium: 139 mmol/L (ref 135–145)

## 2016-09-13 MED ORDER — ASPIRIN 325 MG PO TBEC: 325.0000 mg | DELAYED_RELEASE_TABLET | Freq: Every day | ORAL | 0 refills | Status: DC

## 2016-09-13 MED ORDER — METHOCARBAMOL 500 MG PO TABS: 500.0000 mg | ORAL_TABLET | Freq: Four times a day (QID) | ORAL | 0 refills | Status: AC | PRN

## 2016-09-13 MED ORDER — SODIUM CHLORIDE 0.9 % IV BOLUS (SEPSIS)
250.0000 mL | Freq: Once | INTRAVENOUS | Status: AC
  Administered 2016-09-13: 250 mL via INTRAVENOUS

## 2016-09-13 MED ORDER — TRAMADOL HCL 50 MG PO TABS: 50.0000 mg | ORAL_TABLET | Freq: Four times a day (QID) | ORAL | 0 refills | Status: AC | PRN

## 2016-09-13 MED ORDER — OXYCODONE HCL 5 MG PO TABS: 5.0000 mg | ORAL_TABLET | ORAL | 0 refills | Status: DC | PRN

## 2016-09-13 NOTE — Discharge Instructions (Signed)
° °Dr. Frank Aluisio °Total Joint Specialist °[facility] °[address] °[place], [place] [address] °[phone] ° °ANTERIOR APPROACH TOTAL HIP REPLACEMENT POSTOPERATIVE DIRECTIONS ° ° °Hip Rehabilitation, Guidelines Following Surgery  °The results of a hip operation are greatly improved after range of motion and muscle strengthening exercises. Follow all safety measures which are given to protect your hip. If any of these exercises cause increased pain or swelling in your joint, decrease the amount until you are comfortable again. Then slowly increase the exercises. Call your caregiver if you have problems or questions.  ° °HOME CARE INSTRUCTIONS  °Remove items at home which could result in a fall. This includes throw rugs or furniture in walking pathways.  °· ICE to the affected hip every three hours for 30 minutes at a time and then as needed for pain and swelling.  Continue to use ice on the hip for pain and swelling from surgery. You may notice swelling that will progress down to the foot and ankle.  This is normal after surgery.  Elevate the leg when you are not up walking on it.   °· Continue to use the breathing machine which will help keep your temperature down.  It is common for your temperature to cycle up and down following surgery, especially at night when you are not up moving around and exerting yourself.  The breathing machine keeps your lungs expanded and your temperature down. ° ° °DIET °You may resume your previous home diet once your are discharged from the hospital. ° °DRESSING / WOUND CARE / SHOWERING °You may shower 3 days after surgery, but keep the wounds dry during showering.  You may use an occlusive plastic wrap (Press'n Seal for example), NO SOAKING/SUBMERGING IN THE BATHTUB.  If the bandage gets wet, change with a clean dry gauze.  If the incision gets wet, pat the wound dry with a clean towel. °You may start showering once you are discharged home but do not  submerge the incision under water. Just pat the incision dry and apply a dry gauze dressing on daily. °Change the surgical dressing daily and reapply a dry dressing each time. ° °ACTIVITY °Walk with your walker as instructed. °Use walker as long as suggested by your caregivers. °Avoid periods of inactivity such as sitting longer than an hour when not asleep. This helps prevent blood clots.  °You may resume a sexual relationship in one month or when given the OK by your doctor.  °You may return to work once you are cleared by your doctor.  °Do not drive a car for 6 weeks or until released by you surgeon.  °Do not drive while taking narcotics. ° °WEIGHT BEARING °Weight bearing as tolerated with assist device (walker, cane, etc) as directed, use it as long as suggested by your surgeon or therapist, typically at least 4-6 weeks. ° °POSTOPERATIVE CONSTIPATION PROTOCOL °Constipation - defined medically as fewer than three stools per week and severe constipation as less than one stool per week. ° °One of the most common issues patients have following surgery is constipation.  Even if you have a regular bowel pattern at home, your normal regimen is likely to be disrupted due to multiple reasons following surgery.  Combination of anesthesia, postoperative narcotics, change in appetite and fluid intake all can affect your bowels.  In order to avoid complications following surgery, here are some recommendations in order to help you during your recovery period. ° °Colace (docusate) - Pick up an over-the-counter   form of Colace or another stool softener and take twice a day as long as you are requiring postoperative pain medications.  Take with a full glass of water daily.  If you experience loose stools or diarrhea, hold the colace until you stool forms back up.  If your symptoms do not get better within 1 week or if they get worse, check with your doctor. ° °Dulcolax (bisacodyl) - Pick up over-the-counter and take as directed  by the product packaging as needed to assist with the movement of your bowels.  Take with a full glass of water.  Use this product as needed if not relieved by Colace only.  ° °MiraLax (polyethylene glycol) - Pick up over-the-counter to have on hand.  MiraLax is a solution that will increase the amount of water in your bowels to assist with bowel movements.  Take as directed and can mix with a glass of water, juice, soda, coffee, or tea.  Take if you go more than two days without a movement. °Do not use MiraLax more than once per day. Call your doctor if you are still constipated or irregular after using this medication for 7 days in a row. ° °If you continue to have problems with postoperative constipation, please contact the office for further assistance and recommendations.  If you experience "the worst abdominal pain ever" or develop nausea or vomiting, please contact the office immediatly for further recommendations for treatment. ° °ITCHING ° If you experience itching with your medications, try taking only a single pain pill, or even half a pain pill at a time.  You can also use Benadryl over the counter for itching or also to help with sleep.  ° °TED HOSE STOCKINGS °Wear the elastic stockings on both legs for three weeks following surgery during the day but you may remove then at night for sleeping. ° °MEDICATIONS °See your medication summary on the “After Visit Summary” that the nursing staff will review with you prior to discharge.  You may have some home medications which will be placed on hold until you complete the course of blood thinner medication.  It is important for you to complete the blood thinner medication as prescribed by your surgeon.  Continue your approved medications as instructed at time of discharge. ° °PRECAUTIONS °If you experience chest pain or shortness of breath - call 911 immediately for transfer to the hospital emergency department.  °If you develop a fever greater that 101 F,  purulent drainage from wound, increased redness or drainage from wound, foul odor from the wound/dressing, or calf pain - CONTACT YOUR SURGEON.   °                                                °FOLLOW-UP APPOINTMENTS °Make sure you keep all of your appointments after your operation with your surgeon and caregivers. You should call the office at the above phone number and make an appointment for approximately two weeks after the date of your surgery or on the date instructed by your surgeon outlined in the "After Visit Summary". ° °RANGE OF MOTION AND STRENGTHENING EXERCISES  °These exercises are designed to help you keep full movement of your hip joint. Follow your caregiver's or physical therapist's instructions. Perform all exercises about fifteen times, three times per day or as directed. Exercise both hips, even if you   have had only one joint replacement. These exercises can be done on a training (exercise) mat, on the floor, on a table or on a bed. Use whatever works the best and is most comfortable for you. Use music or television while you are exercising so that the exercises are a pleasant break in your day. This will make your life better with the exercises acting as a break in routine you can look forward to.  Lying on your back, slowly slide your foot toward your buttocks, raising your knee up off the floor. Then slowly slide your foot back down until your leg is straight again.  Lying on your back spread your legs as far apart as you can without causing discomfort.  Lying on your side, raise your upper leg and foot straight up from the floor as far as is comfortable. Slowly lower the leg and repeat.  Lying on your back, tighten up the muscle in the front of your thigh (quadriceps muscles). You can do this by keeping your leg straight and trying to raise your heel off the floor. This helps strengthen the largest muscle supporting your knee.  Lying on your back, tighten up the muscles of your  buttocks both with the legs straight and with the knee bent at a comfortable angle while keeping your heel on the floor.   IF YOU ARE TRANSFERRED TO A SKILLED REHAB FACILITY If the patient is transferred to a skilled rehab facility following release from the hospital, a list of the current medications will be sent to the facility for the patient to continue.  When discharged from the skilled rehab facility, please have the facility set up the patient's Catherine prior to being released. Also, the skilled facility will be responsible for providing the patient with their medications at time of release from the facility to include their pain medication, the muscle relaxants, and their blood thinner medication. If the patient is still at the rehab facility at time of the two week follow up appointment, the skilled rehab facility will also need to assist the patient in arranging follow up appointment in our office and any transportation needs.  MAKE SURE YOU:  Understand these instructions.  Get help right away if you are not doing well or get worse.    Pick up stool softner and laxative for home use following surgery while on pain medications. Do not submerge incision under water. Please use good hand washing techniques while changing dressing each day. May shower starting three days after surgery. Please use a clean towel to pat the incision dry following showers. Continue to use ice for pain and swelling after surgery. Do not use any lotions or creams on the incision until instructed by your surgeon.  Take a full dose 325 mg Aspirin daily for a total of three weeks.  After three weeks, stop the full dose aspirin and then resume the low dose 81 mg Aspirin daily at home.  Resume the Plavix 75 mg at home following discharge.

## 2016-09-13 NOTE — Discharge Summary (Signed)
Physician Discharge Summary   Patient ID: [name] MRN: [id number] DOB/AGE: 07/18/1950 66 y.o.  Admit date: 09/12/2016 Discharge date: 09/13/2016  Primary Diagnosis:  Osteoarthritis of the Right hip.   Admission Diagnoses:  Past Medical History:  Diagnosis Date  . Anxiety   . Cystocele   . Hypertension   . PMB (postmenopausal bleeding)   . Rectocele   . SUI (stress urinary incontinence, female)   . Unstable bladder   . Urination frequency   . 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 Vaginal atrophy   . Vaginal itching    Discharge Diagnoses:   Principal Problem:   OA (osteoarthritis) of hip  Estimated body mass index is 27.63 kg/m as calculated from the following:   Height as of this encounter: '5\' 3"'  (1.6 m).   Weight as of this encounter: 70.8 kg (156 lb).  Procedure(s) (LRB): RIGHT TOTAL HIP ARTHROPLASTY ANTERIOR APPROACH (Right)   Consults: None  HPI: [name] is a 66 y.o. female who has advanced end-  stage arthritis of their Right  hip with progressively worsening pain and  dysfunction.The patient has failed nonoperative management and presents for  total hip arthroplasty.   Laboratory Data: Admission on 09/12/2016  Component Date Value Ref Range Status  . WBC 09/13/2016 15.4* 4.0 - 10.5 K/uL Final  . RBC 09/13/2016 3.61* 3.87 - 5.11 MIL/uL Final  . Hemoglobin 09/13/2016 11.3* 12.0 - 15.0 g/dL Final  . HCT 09/13/2016 33.7* 36.0 - 46.0 % Final  . MCV 09/13/2016 93.4  78.0 - 100.0 fL Final  . MCH 09/13/2016 31.3  26.0 - 34.0 pg Final  . MCHC 09/13/2016 33.5  30.0 - 36.0 g/dL Final  . RDW 09/13/2016 13.7  11.5 - 15.5 % Final  . Platelets 09/13/2016 216  150 - 400 K/uL Final  . Sodium 09/13/2016 138  135 - 145 mmol/L Final  . Potassium 09/13/2016 5.2* 3.5 - 5.1 mmol/L Final  . Chloride 09/13/2016 105  101 - 111 mmol/L Final  . CO2 09/13/2016 26  22 - 32 mmol/L Final  . Glucose, Bld 09/13/2016 149* 65 - 99 mg/dL Final  . BUN 09/13/2016 13   6 - 20 mg/dL Final  . Creatinine, Ser 09/13/2016 0.83  0.44 - 1.00 mg/dL Final  . Calcium 09/13/2016 9.3  8.9 - 10.3 mg/dL Final  . GFR calc non Af Amer 09/13/2016 >60  >60 mL/min Final  . GFR calc Af Amer 09/13/2016 >60  >60 mL/min Final   Comment: (NOTE) The eGFR has been calculated using the CKD EPI equation. This calculation has not been validated in all clinical situations. eGFR's persistently <60 mL/min signify possible Chronic Kidney Disease.   Georgiann Hahn gap 09/13/2016 7  5 - 15 Final  Hospital Outpatient Visit on 09/06/2016  Component Date Value Ref Range Status  . aPTT 09/06/2016 24  24 - 36 seconds Final  . WBC 09/06/2016 7.0  4.0 - 10.5 K/uL Final  . RBC 09/06/2016 4.28  3.87 - 5.11 MIL/uL Final  . Hemoglobin 09/06/2016 13.5  12.0 - 15.0 g/dL Final  . HCT 09/06/2016 40.2  36.0 - 46.0 % Final  . MCV 09/06/2016 93.9  78.0 - 100.0 fL Final  . MCH 09/06/2016 31.5  26.0 - 34.0 pg Final  . MCHC 09/06/2016 33.6  30.0 - 36.0 g/dL Final  . RDW 09/06/2016 13.9  11.5 - 15.5 % Final  . Platelets 09/06/2016 235  150 - 400 K/uL Final  .  Sodium 09/06/2016 141  135 - 145 mmol/L Final  . Potassium 09/06/2016 5.5* 3.5 - 5.1 mmol/L Final  . Chloride 09/06/2016 106  101 - 111 mmol/L Final  . CO2 09/06/2016 28  22 - 32 mmol/L Final  . Glucose, Bld 09/06/2016 83  65 - 99 mg/dL Final  . BUN 09/06/2016 18  6 - 20 mg/dL Final  . Creatinine, Ser 09/06/2016 0.83  0.44 - 1.00 mg/dL Final  . Calcium 09/06/2016 9.8  8.9 - 10.3 mg/dL Final  . Total Protein 09/06/2016 7.6  6.5 - 8.1 g/dL Final  . Albumin 09/06/2016 4.2  3.5 - 5.0 g/dL Final  . AST 09/06/2016 29  15 - 41 U/L Final  . ALT 09/06/2016 20  14 - 54 U/L Final  . Alkaline Phosphatase 09/06/2016 74  38 - 126 U/L Final  . Total Bilirubin 09/06/2016 0.2* 0.3 - 1.2 mg/dL Final  . GFR calc non Af Amer 09/06/2016 >60  >60 mL/min Final  . GFR calc Af Amer 09/06/2016 >60  >60 mL/min Final   Comment: (NOTE) The eGFR has been calculated using the  CKD EPI equation. This calculation has not been validated in all clinical situations. eGFR's persistently <60 mL/min signify possible Chronic Kidney Disease.   . Anion gap 09/06/2016 7  5 - 15 Final  . Prothrombin Time 09/06/2016 13.1  11.4 - 15.2 seconds Final  . INR 09/06/2016 0.99   Final  . ABO/RH(D) 09/06/2016 O POS   Final  . Antibody Screen 09/06/2016 NEG   Final  . Sample Expiration 09/06/2016 09/15/2016   Final  . Extend sample reason 09/06/2016 NO TRANSFUSIONS OR PREGNANCY IN THE PAST 3 MONTHS   Final  . MRSA, PCR 09/06/2016 NEGATIVE  NEGATIVE Final  . Staphylococcus aureus 09/06/2016 NEGATIVE  NEGATIVE Final   Comment:        The Xpert SA Assay (FDA approved for NASAL specimens in patients over 84 years of age), is one component of a comprehensive surveillance program.  Test performance has been validated by [facility] for patients greater than or equal to 19 year old. It is not intended to diagnose infection nor to guide or monitor treatment.   . ABO/RH(D) 09/06/2016 O POS   Final     X-Rays:Dg Pelvis Portable  Result Date: 09/12/2016 CLINICAL DATA:  66 year old female with a history of right total hip arthroplasty EXAM: PORTABLE PELVIS 1-2 VIEWS COMPARISON:  CT 01/30/2014 FINDINGS: Davol drain projects over the pelvis, limiting evaluation of the pelvic ring. Surgical changes of right total hip arthroplasty. Surgical drain projects within the soft tissues. Gas within the soft tissues. Femoral component appears located within the acetabular component. Unremarkable appearance of the left femur. No perihardware fracture. IMPRESSION: Early surgical changes of right total hip arthroplasty. Surgical drain projects within the soft tissues, with davol drain projecting over the pelvis. Electronically Signed   By: Corrie Mckusick D.O.   On: 09/12/2016 18:15   Dg C-arm 1-60 Min-no Report  Result Date: 09/12/2016 Fluoroscopy was utilized by the requesting physician.  No radiographic  interpretation.    EKG: Orders placed or performed in visit on 10/16/11  . EKG 12-Lead     Hospital Course: Patient was admitted to [facility] and taken to the OR and underwent the above state procedure without complications.  Patient tolerated the procedure well and was later transferred to the recovery room and then to the orthopaedic floor for postoperative care.  They were given PO and IV analgesics  for pain control following their surgery.  They were given 24 hours of postoperative antibiotics of  Anti-infectives    Start     Dose/Rate Route Frequency Ordered Stop   09/13/16 0600  vancomycin (VANCOCIN) IVPB 1000 mg/200 mL premix     1,000 mg 200 mL/hr over 60 Minutes Intravenous On call to O.R. 09/12/16 1245 09/12/16 1439   09/13/16 0200  vancomycin (VANCOCIN) IVPB 1000 mg/200 mL premix    Comments:  Give 50 mg po Benadryl prior to dose. Infuse slowly over 2 hours   1,000 mg 100 mL/hr over 120 Minutes Intravenous Every 12 hours 09/12/16 1821 09/13/16 0402     and started on DVT prophylaxis in the form of Aspirin and Plavix.   PT and OT were ordered for total hip protocol.  The patient was allowed to be WBAT with therapy. Discharge planning was consulted to help with postop disposition and equipment needs.  Patient had a good night on the evening of surgery.  They started to get up OOB with therapy on day one.  Hemovac drain was pulled without difficulty.    Patient was seen in rounds on POD 1 and was ready to go home later that same afternoon following therapy.  Diet - Cardiac diet Follow up - in 2 weeks Activity - WBAT Disposition - Home Condition Upon Discharge - stable D/C Meds - See DC Summary DVT Prophylaxis - Aspirin and Plavix  Discharge Instructions    Call MD / Call 911    Complete by:  As directed    If you experience chest pain or shortness of breath, CALL 911 and be transported to the hospital emergency room.  If you develope a fever above 101 F, pus (white  drainage) or increased drainage or redness at the wound, or calf pain, call your surgeon's office.   Change dressing    Complete by:  As directed    You may change your dressing dressing daily with sterile 4 x 4 inch gauze dressing and paper tape.  Do not submerge the incision under water.   Constipation Prevention    Complete by:  As directed    Drink plenty of fluids.  Prune juice may be helpful.  You may use a stool softener, such as Colace (over the counter) 100 mg twice a day.  Use MiraLax (over the counter) for constipation as needed.   Diet - low sodium heart healthy    Complete by:  As directed    Discharge instructions    Complete by:  As directed    Take a full dose 325 mg Aspirin daily for a total of three weeks.  After three weeks, stop the full dose aspirin and then resume the low dose 81 mg Aspirin daily at home.  Resume the Plavix 75 mg at home following discharge.  Pick up stool softner and laxative for home use following surgery while on pain medications. Do not submerge incision under water. Please use good hand washing techniques while changing dressing each day. May shower starting three days after surgery. Please use a clean towel to pat the incision dry following showers. Continue to use ice for pain and swelling after surgery. Do not use any lotions or creams on the incision until instructed by your surgeon.  Wear both TED hose on both legs during the day every day for three weeks, but may remove the TED hose at night at home.  Postoperative Constipation Protocol  Constipation - defined medically as fewer  than three stools per week and severe constipation as less than one stool per week.  One of the most common issues patients have following surgery is constipation.  Even if you have a regular bowel pattern at home, your normal regimen is likely to be disrupted due to multiple reasons following surgery.  Combination of anesthesia, postoperative narcotics, change in  appetite and fluid intake all can affect your bowels.  In order to avoid complications following surgery, here are some recommendations in order to help you during your recovery period.  Colace (docusate) - Pick up an over-the-counter form of Colace or another stool softener and take twice a day as long as you are requiring postoperative pain medications.  Take with a full glass of water daily.  If you experience loose stools or diarrhea, hold the colace until you stool forms back up.  If your symptoms do not get better within 1 week or if they get worse, check with your doctor.  Dulcolax (bisacodyl) - Pick up over-the-counter and take as directed by the product packaging as needed to assist with the movement of your bowels.  Take with a full glass of water.  Use this product as needed if not relieved by Colace only.   MiraLax (polyethylene glycol) - Pick up over-the-counter to have on hand.  MiraLax is a solution that will increase the amount of water in your bowels to assist with bowel movements.  Take as directed and can mix with a glass of water, juice, soda, coffee, or tea.  Take if you go more than two days without a movement. Do not use MiraLax more than once per day. Call your doctor if you are still constipated or irregular after using this medication for 7 days in a row.  If you continue to have problems with postoperative constipation, please contact the office for further assistance and recommendations.  If you experience "the worst abdominal pain ever" or develop nausea or vomiting, please contact the office immediatly for further recommendations for treatment.   Do not sit on low chairs, stoools or toilet seats, as it may be difficult to get up from low surfaces    Complete by:  As directed    Driving restrictions    Complete by:  As directed    No driving until released by the physician.   Increase activity slowly as tolerated    Complete by:  As directed    Lifting restrictions     Complete by:  As directed    No lifting until released by the physician.   Patient may shower    Complete by:  As directed    You may shower without a dressing once there is no drainage.  Do not wash over the wound.  If drainage remains, do not shower until drainage stops.   TED hose    Complete by:  As directed    Use stockings (TED hose) for 3 weeks on both leg(s).  You may remove them at night for sleeping.   Weight bearing as tolerated    Complete by:  As directed    Laterality:  right   Extremity:  Lower     Allergies as of 09/13/2016      Reactions   Vancomycin    Causes RED MANS SYNDROME; per Allergist; may be administered with these precautions: "pleas pre-treat patient with benadryl 31m prior to dosing. Run in the infusion at a slower rate . Run the infusion over at least 2 hours  or longer."   Biaxin [clarithromycin]    Gi pain, jittery   Cephalosporins Other (See Comments)   Doxycycline Other (See Comments)   Jittery    Levaquin [levofloxacin In D5w] Other (See Comments)   Unknown    Motrin [ibuprofen] Other (See Comments)   In high doses, unknown reaction   Sulfa Antibiotics Hives   Hypotension, chills    Amoxicillin Rash   Has patient had a PCN reaction causing immediate rash, facial/tongue/throat swelling, SOB or lightheadedness with hypotension: Yes Has patient had a PCN reaction causing severe rash involving mucus membranes or skin necrosis: No Has patient had a PCN reaction that required hospitalization: No Has patient had a PCN reaction occurring within the last 10 years: No If all of the above answers are "NO", then may proceed with Cephalosporin use.   Cefuroxime Palpitations   Anxiety    Ciprofloxacin Palpitations   anxiety   Clindamycin/lincomycin Rash, Other (See Comments)   Dizzy, light headed      Medication List    STOP taking these medications   CENTRUM WOMEN Tabs   estradiol 0.1 MG/GM vaginal cream Commonly known as:  ESTRACE     hydroxychloroquine 200 MG tablet Commonly known as:  PLAQUENIL   Vitamin D3 2000 units capsule     TAKE these medications   acetaminophen 650 MG CR tablet Commonly known as:  TYLENOL Take 1,300 mg by mouth daily as needed for pain.   amLODipine 2.5 MG tablet Commonly known as:  NORVASC Take 2.5 mg by mouth daily.   aspirin 325 MG EC tablet Take 1 tablet (325 mg total) by mouth daily with breakfast. Take a full dose 325 mg Aspirin daily for a total of three weeks.  After three weeks, stop the full dose aspirin and then resume the low dose 81 mg Aspirin daily at home. What changed:  medication strength  how much to take  when to take this  additional instructions   clopidogrel 75 MG tablet Commonly known as:  PLAVIX Take 75 mg by mouth daily.   loratadine 10 MG tablet Commonly known as:  CLARITIN Take 10 mg by mouth daily as needed for allergies.   methocarbamol 500 MG tablet Commonly known as:  ROBAXIN Take 1 tablet (500 mg total) by mouth every 6 (six) hours as needed for muscle spasms.   metoprolol tartrate 25 MG tablet Commonly known as:  LOPRESSOR Take 12.5 mg by mouth 2 (two) times daily.   MUCINEX MAXIMUM STRENGTH 1200 MG Tb12 Generic drug:  Guaifenesin Take 1,200 mg by mouth daily as needed (congestion).   oxyCODONE 5 MG immediate release tablet Commonly known as:  Oxy IR/ROXICODONE Take 1-2 tablets (5-10 mg total) by mouth every 4 (four) hours as needed for moderate pain or severe pain.   ranitidine 150 MG tablet Commonly known as:  ZANTAC Take 150 mg by mouth daily as needed for heartburn.   rosuvastatin 5 MG tablet Commonly known as:  CRESTOR Take 5 mg by mouth at bedtime.   tolterodine 2 MG tablet Commonly known as:  DETROL Take 2 mg by mouth daily.   traMADol 50 MG tablet Commonly known as:  ULTRAM Take 1-2 tablets (50-100 mg total) by mouth every 6 (six) hours as needed for moderate pain.   venlafaxine XR 75 MG 24 hr capsule Commonly  known as:  EFFEXOR-XR Take 75 mg by mouth daily.      Follow-up Information    Gaynelle Arabian, MD. Schedule an appointment as  soon as possible for a visit on 09/27/2016.   Specialty:  Orthopedic Surgery Why:  Follow Thursday 09/27/16 with Darrel Reach information: [address] [place] [address] [phone]           Signed: Arlee Muslim, PA-C Orthopaedic Surgery 09/13/2016, 7:30 AM

## 2016-09-13 NOTE — Evaluation (Signed)
Physical Therapy Evaluation Patient Details Name: [name] MRN: [id number] DOB: [birth year] Today's Date: 09/13/2016   History of Present Illness  [name]  Clinical Impression  The patient tolerated ambulation today. BP 106/51, mild dizziness.  Pt admitted with above diagnosis. Pt currently with functional limitations due to the deficits listed below (see PT Problem List).  Pt will benefit from skilled PT to increase their independence and safety with mobility to allow discharge to the venue listed below.       Follow Up Recommendations Home health PT    Equipment Recommendations  None recommended by PT    Recommendations for Other Services       Precautions / Restrictions Precautions Precautions: Fall      Mobility  Bed Mobility Overal bed mobility: Needs Assistance Bed Mobility: Supine to Sit     Supine to sit: Min assist     General bed mobility comments: cues for technique  Transfers Overall transfer level: Needs assistance Equipment used: Rolling walker (2 wheeled) Transfers: Sit to/from Stand Sit to Stand: Min assist         General transfer comment: cues for hand and right leg position  Ambulation/Gait Ambulation/Gait assistance: Min assist Ambulation Distance (Feet): 70 Feet Assistive device: Rolling walker (2 wheeled) Gait Pattern/deviations: Step-to pattern;Step-through pattern;Antalgic     General Gait Details: cues for sequence, then ambulated to and from BAthroom.  Stairs            Wheelchair Mobility    Modified Rankin (Stroke Patients Only)       Balance                                             Pertinent Vitals/Pain Pain Assessment: 0-10 Pain Score: 5  Pain Location: right hip Pain Descriptors / Indicators: Aching;Sore Pain Intervention(s): Patient requesting pain meds-RN notified;Premedicated before session;Repositioned;Ice applied    Home Living Family/patient expects to be discharged to::  Private residence Living Arrangements: Spouse/significant other Available Help at Discharge: Family Type of Home: House Home Access: Stairs to enter Entrance Stairs-Rails: None Entrance Stairs-Number of Steps: 3 Home Layout: One level Home Equipment: Environmental consultant - 2 wheels;Cane - single point      Prior Function Level of Independence: Independent               Hand Dominance        Extremity/Trunk Assessment   Upper Extremity Assessment Upper Extremity Assessment: Overall WFL for tasks assessed    Lower Extremity Assessment Lower Extremity Assessment: RLE deficits/detail RLE Deficits / Details: bears weight, advances the leg    Cervical / Trunk Assessment Cervical / Trunk Assessment: Normal  Communication   Communication: No difficulties  Cognition Arousal/Alertness: Awake/alert Behavior During Therapy: WFL for tasks assessed/performed Overall Cognitive Status: Within Functional Limits for tasks assessed                                        General Comments      Exercises Total Joint Exercises Ankle Circles/Pumps: AROM;10 reps;Supine Short Arc Quad: AAROM;Right;10 reps;Supine Heel Slides: AAROM;Right;10 reps;Supine Hip ABduction/ADduction: AAROM;Right;10 reps;Supine Long Arc Quad: AROM;Right;10 reps;Seated   Assessment/Plan    PT Assessment Patient needs continued PT services  PT Problem List Decreased strength;Decreased activity tolerance;Decreased range of motion;Decreased mobility;Decreased knowledge of  precautions;Decreased safety awareness;Pain       PT Treatment Interventions DME instruction;Gait training;Stair training;Functional mobility training;Therapeutic activities;Therapeutic exercise;Patient/family education    PT Goals (Current goals can be found in the Care Plan section)  Acute Rehab PT Goals Patient Stated Goal: get back to yoga PT Goal Formulation: With patient/family Time For Goal Achievement: 09/15/16 Potential to  Achieve Goals: Good    Frequency 7X/week   Barriers to discharge        Co-evaluation               AM-PAC PT "6 Clicks" Daily Activity  Outcome Measure Difficulty turning over in bed (including adjusting bedclothes, sheets and blankets)?: Total Difficulty moving from lying on back to sitting on the side of the bed? : Total Difficulty sitting down on and standing up from a chair with arms (e.g., wheelchair, bedside commode, etc,.)?: A Little Help needed moving to and from a bed to chair (including a wheelchair)?: A Little Help needed walking in hospital room?: A Little Help needed climbing 3-5 steps with a railing? : A Lot 6 Click Score: 13    End of Session   Activity Tolerance: Patient tolerated treatment well Patient left: in chair;with call bell/phone within reach;with family/visitor present Nurse Communication: Mobility status PT Visit Diagnosis: Pain;Difficulty in walking, not elsewhere classified (R26.2) Pain - Right/Left: Right Pain - part of body: Hip    Time: 7341-9379 PT Time Calculation (min) (ACUTE ONLY): 44 min   Charges:   PT Evaluation $PT Eval Low Complexity: 1 Low PT Treatments $Gait Training: 8-22 mins $Therapeutic Exercise: 8-22 mins   PT G CodesTresa Endo PT [phone]    Claretha Cooper 09/13/2016, 9:54 AM

## 2016-09-13 NOTE — Progress Notes (Signed)
   Subjective: 1 Day Post-Op Procedure(s) (LRB): RIGHT TOTAL HIP ARTHROPLASTY ANTERIOR APPROACH (Right) Patient reports pain as mild.   Patient seen in rounds by Dr. Wynelle Link. Patient is well, but has had some minor complaints of pain in the hip, requiring pain medications We will start therapy today.  If they do well with therapy and meets all goals, then will allow home later this afternoon following therapy. Plan is to go Home after hospital stay.  Objective: Vital signs in last 24 hours: Temp:  [97.5 F (36.4 C)-98.5 F (36.9 C)] 97.8 F (36.6 C) (08/02 0549) Pulse Rate:  [55-61] 55 (08/02 0549) Resp:  [12-16] 15 (08/02 0549) BP: (94-147)/(56-92) 97/56 (08/02 0549) SpO2:  [97 %-100 %] 98 % (08/02 0549) Weight:  [70.8 kg (156 lb)] 70.8 kg (156 lb) (08/01 1318)  Intake/Output from previous day:  Intake/Output Summary (Last 24 hours) at 09/13/16 0713 Last data filed at 09/13/16 0600  Gross per 24 hour  Intake          2621.25 ml  Output             1615 ml  Net          1006.25 ml    Intake/Output this shift: No intake/output data recorded.  Labs:  Recent Labs  09/13/16 0543  HGB 11.3*    Recent Labs  09/13/16 0543  WBC 15.4*  RBC 3.61*  HCT 33.7*  PLT 216    Recent Labs  09/13/16 0543  NA 138  K 5.2*  CL 105  CO2 26  BUN 13  CREATININE 0.83  GLUCOSE 149*  CALCIUM 9.3   No results for input(s): LABPT, INR in the last 72 hours.  EXAM General - Patient is Alert and Appropriate Extremity - Neurovascular intact Sensation intact distally Dressing - dressing C/D/I Motor Function - intact, moving foot and toes well on exam.  Hemovac pulled without difficulty.  Past Medical History:  Diagnosis Date  . Anxiety   . Cystocele   . Hypertension   . PMB (postmenopausal bleeding)   . Rectocele   . SUI (stress urinary incontinence, female)   . Unstable bladder   . Urination frequency   . 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 Vaginal  atrophy   . Vaginal itching     Assessment/Plan: 1 Day Post-Op Procedure(s) (LRB): RIGHT TOTAL HIP ARTHROPLASTY ANTERIOR APPROACH (Right) Principal Problem:   OA (osteoarthritis) of hip  Estimated body mass index is 27.63 kg/m as calculated from the following:   Height as of this encounter: '5\' 3"'$  (1.6 m).   Weight as of this encounter: 70.8 kg (156 lb). Advance diet Up with therapy Discharge home with home health when met goals  DVT Prophylaxis - Aspirin and Plavix Weight Bearing As Tolerated right Leg Hemovac Pulled Begin Therapy  If meets goals and able to go home: Discharge home with home health Diet - Cardiac diet Follow up - in 2 weeks Activity - WBAT Disposition - Home Condition Upon Discharge - pending therapy D/C Meds - See DC Summary DVT Prophylaxis - Aspirin and Plavix  Arlee Muslim, PA-C Orthopaedic Surgery 09/13/2016, 7:13 AM

## 2016-09-13 NOTE — Progress Notes (Signed)
Physical Therapy Treatment Patient Details Name: [name] MRN: [id number] DOB: [birth year] Today's Date: 09/13/2016    History of Present Illness [name]    PT Comments    POD # 1 pm session With spouse present for "hands on" instruction, practed transfers, amb with walker and stairs.  Also practiced a shower transfer.  Pt feeling better and plans to D/C to home.   Follow Up Recommendations  Home health PT     Equipment Recommendations  None recommended by PT    Recommendations for Other Services       Precautions / Restrictions Precautions Precautions: Fall Restrictions Weight Bearing Restrictions: No    Mobility  Bed Mobility Overal bed mobility: Needs Assistance Bed Mobility: Supine to Sit     Supine to sit: Min guard     General bed mobility comments: assisted by spouse  Transfers Overall transfer level: Needs assistance Equipment used: Rolling walker (2 wheeled) Transfers: Sit to/from Stand Sit to Stand: Supervision;Min guard         General transfer comment: cues for hand and right leg position  Ambulation/Gait Ambulation/Gait assistance: Supervision;Min guard Ambulation Distance (Feet): 35 Feet Assistive device: Rolling walker (2 wheeled) Gait Pattern/deviations: Step-to pattern;Step-through pattern;Antalgic Gait velocity: decreased   General Gait Details: cues for sequence, amb with spouse "hands on"   Stairs Stairs: Yes   Stair Management: No rails;Step to pattern;Forwards;With walker Number of Stairs: 2 General stair comments: with spouse "hands on" assist with instruction on safe handling and proper walker placement  Wheelchair Mobility    Modified Rankin (Stroke Patients Only)       Balance                                            Cognition Arousal/Alertness: Awake/alert Behavior During Therapy: WFL for tasks assessed/performed Overall Cognitive Status: Within Functional Limits for tasks assessed                                        Exercises      General Comments        Pertinent Vitals/Pain Pain Assessment: 0-10 Pain Score: 4  Pain Location: right hip Pain Descriptors / Indicators: Aching;Sore;Operative site guarding Pain Intervention(s): Premedicated before session;Monitored during session;Repositioned;Ice applied    Home Living                      Prior Function            PT Goals (current goals can now be found in the care plan section) Progress towards PT goals: Progressing toward goals    Frequency           PT Plan Current plan remains appropriate    Co-evaluation              AM-PAC PT "6 Clicks" Daily Activity  Outcome Measure  Difficulty turning over in bed (including adjusting bedclothes, sheets and blankets)?: Total Difficulty moving from lying on back to sitting on the side of the bed? : Total Difficulty sitting down on and standing up from a chair with arms (e.g., wheelchair, bedside commode, etc,.)?: Total Help needed moving to and from a bed to chair (including a wheelchair)?: A Little Help needed walking in hospital room?: A Little Help needed climbing  3-5 steps with a railing? : A Little 6 Click Score: 12    End of Session Equipment Utilized During Treatment: Gait belt Activity Tolerance: Patient tolerated treatment well Patient left: in bed;with call bell/phone within reach Nurse Communication:  (pt ready for D/C to home) PT Visit Diagnosis: Pain;Difficulty in walking, not elsewhere classified (R26.2)     Time: 1281-1886 PT Time Calculation (min) (ACUTE ONLY): 23 min  Charges:  $Gait Training: 8-22 mins $Therapeutic Activity: 8-22 mins                    G Codes:       [name]  PTA [facility]  Acute  Rehab Pager      [phone]

## 2016-09-13 NOTE — Progress Notes (Addendum)
Discharge planning, spoke with patient and spouse at bedside. Have chosen [facility] for [facility] PT. Contacted [facility] for referral. Has RW and 3n1, requesting a tub bench, contacted [facility] to deliver to room. [phone]

## 2017-07-15 ENCOUNTER — Encounter: Payer: Self-pay | Admitting: Student

## 2017-07-16 ENCOUNTER — Encounter
Admission: RE | Disposition: A | Discharge status: Home / Self Care | Payer: Self-pay | Source: Ambulatory Visit | Attending: Gastroenterology

## 2017-07-16 ENCOUNTER — Ambulatory Visit: Payer: Medicare Other | Admitting: Anesthesiology

## 2017-07-16 ENCOUNTER — Encounter: Payer: Self-pay | Admitting: Anesthesiology

## 2017-07-16 ENCOUNTER — Ambulatory Visit
Admission: RE | Admit: 2017-07-16 | Discharge: 2017-07-16 | Disposition: A | Discharge status: Home / Self Care | Payer: Medicare Other | Source: Ambulatory Visit | Attending: Gastroenterology | Admitting: Gastroenterology

## 2017-07-16 DIAGNOSIS — Z7982 Long term (current) use of aspirin: Secondary | ICD-10-CM | POA: Diagnosis not present

## 2017-07-16 DIAGNOSIS — Z79890 Hormone replacement therapy (postmenopausal): Secondary | ICD-10-CM | POA: Insufficient documentation

## 2017-07-16 DIAGNOSIS — Z7902 Long term (current) use of antithrombotics/antiplatelets: Secondary | ICD-10-CM | POA: Insufficient documentation

## 2017-07-16 DIAGNOSIS — Q438 Other specified congenital malformations of intestine: Secondary | ICD-10-CM | POA: Insufficient documentation

## 2017-07-16 DIAGNOSIS — K621 Rectal polyp: Secondary | ICD-10-CM | POA: Insufficient documentation

## 2017-07-16 DIAGNOSIS — I739 Peripheral vascular disease, unspecified: Secondary | ICD-10-CM | POA: Insufficient documentation

## 2017-07-16 DIAGNOSIS — I10 Essential (primary) hypertension: Secondary | ICD-10-CM | POA: Diagnosis not present

## 2017-07-16 DIAGNOSIS — Z87891 Personal history of nicotine dependence: Secondary | ICD-10-CM | POA: Diagnosis not present

## 2017-07-16 DIAGNOSIS — I252 Old myocardial infarction: Secondary | ICD-10-CM | POA: Insufficient documentation

## 2017-07-16 DIAGNOSIS — F419 Anxiety disorder, unspecified: Secondary | ICD-10-CM | POA: Diagnosis not present

## 2017-07-16 DIAGNOSIS — Z1211 Encounter for screening for malignant neoplasm of colon: Secondary | ICD-10-CM | POA: Diagnosis present

## 2017-07-16 DIAGNOSIS — Z79899 Other long term (current) drug therapy: Secondary | ICD-10-CM | POA: Diagnosis not present

## 2017-07-16 DIAGNOSIS — D123 Benign neoplasm of transverse colon: Secondary | ICD-10-CM | POA: Insufficient documentation

## 2017-07-16 DIAGNOSIS — Z955 Presence of coronary angioplasty implant and graft: Secondary | ICD-10-CM | POA: Insufficient documentation

## 2017-07-16 DIAGNOSIS — I2510 Atherosclerotic heart disease of native coronary artery without angina pectoris: Secondary | ICD-10-CM | POA: Diagnosis not present

## 2017-07-16 HISTORY — DX: Systemic involvement of connective tissue, unspecified: M35.9

## 2017-07-16 HISTORY — PX: COLONOSCOPY WITH PROPOFOL: SHX5780

## 2017-07-16 HISTORY — DX: Dermatitis, unspecified: L30.9

## 2017-07-16 HISTORY — DX: Raynaud's syndrome without gangrene: I73.00

## 2017-07-16 HISTORY — DX: Acute myocardial infarction, unspecified: I21.9

## 2017-07-16 SURGERY — COLONOSCOPY WITH PROPOFOL
Anesthesia: General

## 2017-07-16 MED ORDER — PROPOFOL 500 MG/50ML IV EMUL
INTRAVENOUS | Status: DC | PRN
  Administered 2017-07-16: 120 ug/kg/min via INTRAVENOUS

## 2017-07-16 MED ORDER — LIDOCAINE HCL (PF) 1 % IJ SOLN
INTRAMUSCULAR | Status: AC
  Administered 2017-07-16: 0.3 mL via INTRADERMAL
  Filled 2017-07-16: qty 2

## 2017-07-16 MED ORDER — SODIUM CHLORIDE 0.9 % IV SOLN
INTRAVENOUS | Status: DC
  Administered 2017-07-16: 1000 mL via INTRAVENOUS

## 2017-07-16 MED ORDER — EPHEDRINE SULFATE 50 MG/ML IJ SOLN
INTRAMUSCULAR | Status: DC | PRN
  Administered 2017-07-16: 5 mg via INTRAVENOUS
  Administered 2017-07-16: 10 mg via INTRAVENOUS
  Administered 2017-07-16: 5 mg via INTRAVENOUS

## 2017-07-16 MED ORDER — EPHEDRINE SULFATE 50 MG/ML IJ SOLN
INTRAMUSCULAR | Status: AC
  Filled 2017-07-16: qty 1

## 2017-07-16 MED ORDER — LIDOCAINE HCL (PF) 1 % IJ SOLN
2.0000 mL | Freq: Once | INTRAMUSCULAR | Status: AC
  Administered 2017-07-16: 0.3 mL via INTRADERMAL

## 2017-07-16 MED ORDER — PHENYLEPHRINE HCL 10 MG/ML IJ SOLN
INTRAMUSCULAR | Status: DC | PRN
  Administered 2017-07-16: 50 ug via INTRAVENOUS

## 2017-07-16 MED ORDER — PROPOFOL 500 MG/50ML IV EMUL
INTRAVENOUS | Status: AC
  Filled 2017-07-16: qty 50

## 2017-07-16 NOTE — Anesthesia Procedure Notes (Signed)
Performed by: Cook-Martin, Lilliemae Fruge Pre-anesthesia Checklist: Patient identified, Emergency Drugs available, Patient being monitored, Suction available and Timeout performed Patient Re-evaluated:Patient Re-evaluated prior to induction Oxygen Delivery Method: Nasal cannula Preoxygenation: Pre-oxygenation with 100% oxygen Induction Type: IV induction Placement Confirmation: positive ETCO2 and CO2 detector       

## 2017-07-16 NOTE — OR Nursing (Signed)
IV Right Forearm removed.  Site without redness or swelling.

## 2017-07-16 NOTE — Op Note (Signed)
[facility] Patient Name: [name] Procedure Date: 07/16/2017 12:32 PM MRN: [id number] Account #: [id number] Date of Birth: 1950/09/10 Admit Type: Outpatient Age: 67 Room: [facility] ENDO ROOM 3 Gender: Female Note Status: Finalized Procedure:            Colonoscopy Indications:          Screening for colorectal malignant neoplasm Providers:            Lollie Sails, MD Referring MD:         Irven Easterly. Kary Kos, MD (Referring MD) Medicines:            Monitored Anesthesia Care Complications:        No immediate complications. Procedure:            Pre-Anesthesia Assessment:                       - ASA Grade Assessment: III - A patient with severe                        systemic disease.                       After obtaining informed consent, the colonoscope was                        passed under direct vision. Throughout the procedure,                        the patient's blood pressure, pulse, and oxygen                        saturations were monitored continuously. The                        Colonoscope was introduced through the anus and                        advanced to the the cecum, identified by appendiceal                        orifice and ileocecal valve. The colonoscopy was                        performed with moderate difficulty due to a tortuous                        colon. Successful completion of the procedure was aided                        by changing the patient to a supine position. The                        patient tolerated the procedure well. The quality of                        the bowel preparation was good. Findings:      A 3 mm polyp was found in the distal transverse colon. The polyp was       sessile. The polyp was removed with a cold biopsy forceps. Resection and  retrieval were complete.      A 3 mm polyp was found in the proximal transverse colon. The polyp was       sessile. The polyp was removed with a  cold biopsy forceps. Resection and       retrieval were complete.      A 1 mm polyp was found in the rectum. The polyp was sessile. The polyp       was removed with a cold biopsy forceps. Resection and retrieval were       complete.      The sigmoid colon and descending colon were significantly redundant.      The digital rectal exam was normal. Impression:           - One 3 mm polyp in the distal transverse colon,                        removed with a cold biopsy forceps. Resected and                        retrieved.                       - One 3 mm polyp in the proximal transverse colon,                        removed with a cold biopsy forceps. Resected and                        retrieved.                       - One 1 mm polyp in the rectum, removed with a cold                        biopsy forceps. Resected and retrieved.                       - Redundant colon. Recommendation:       - Discharge patient to home.                       - Telephone GI clinic for pathology results in 1 week.                       - Await pathology results. Procedure Code(s):    --- Professional ---                       [phone], Colonoscopy, flexible; with biopsy, single or                        multiple Diagnosis Code(s):    --- Professional ---                       Z12.11, Encounter for screening for malignant neoplasm                        of colon                       D12.3, Benign neoplasm of transverse colon (hepatic  flexure or splenic flexure)                       K62.1, Rectal polyp                       Q43.8, Other specified congenital malformations of                        intestine CPT copyright 2017 American Medical Association. All rights reserved. The codes documented in this report are preliminary and upon coder review may  be revised to meet current compliance requirements. Lollie Sails, MD 07/16/2017 1:08:31 PM This report has been signed  electronically. Number of Addenda: 0 Note Initiated On: 07/16/2017 12:32 PM Scope Withdrawal Time: 0 hours 10 minutes 52 seconds  Total Procedure Duration: 0 hours 28 minutes 31 seconds       [facility]

## 2017-07-16 NOTE — Anesthesia Post-op Follow-up Note (Signed)
Anesthesia QCDR form completed.        

## 2017-07-16 NOTE — Anesthesia Postprocedure Evaluation (Signed)
Anesthesia Post Note  Patient: [name]  Procedure(s) Performed: COLONOSCOPY WITH PROPOFOL (N/A )  Patient location during evaluation: Endoscopy Anesthesia Type: General Level of consciousness: awake and alert Pain management: pain level controlled Vital Signs Assessment: post-procedure vital signs reviewed and stable Respiratory status: spontaneous breathing, nonlabored ventilation, respiratory function stable and patient connected to nasal cannula oxygen Cardiovascular status: blood pressure returned to baseline and stable Postop Assessment: no apparent nausea or vomiting Anesthetic complications: no     Last Vitals:  Vitals:   07/16/17 1319 07/16/17 1329  BP: (!) 110/51 112/68  Pulse:    Resp: 18   Temp:    SpO2:      Last Pain:  Vitals:   07/16/17 1329  TempSrc:   PainSc: 0-No pain                 Martha Clan

## 2017-07-16 NOTE — Anesthesia Preprocedure Evaluation (Signed)
Anesthesia Evaluation  Patient identified by MRN, date of birth, ID band Patient awake    Reviewed: Allergy & Precautions, H&P , NPO status , Patient's Chart, lab work & pertinent test results, reviewed documented beta blocker date and time   History of Anesthesia Complications Negative for: history of anesthetic complications  Airway Mallampati: I  TM Distance: >3 FB Neck ROM: full    Dental  (+) Dental Advidsory Given, Teeth Intact   Pulmonary neg pulmonary ROS, former smoker,           Cardiovascular Exercise Tolerance: Good hypertension, (-) angina+ CAD, + Past MI, + Cardiac Stents and + Peripheral Vascular Disease  (-) CABG (-) dysrhythmias (-) Valvular Problems/Murmurs     Neuro/Psych PSYCHIATRIC DISORDERS Anxiety negative neurological ROS     GI/Hepatic Neg liver ROS, GERD  ,  Endo/Other  negative endocrine ROS  Renal/GU negative Renal ROS  negative genitourinary   Musculoskeletal   Abdominal   Peds  Hematology negative hematology ROS (+)   Anesthesia Other Findings Past Medical History: No date: Anxiety No date: Connective tissue disease (HCC) No date: Cystocele No date: Eczema No date: Hypertension No date: Myocardial infarction ([place]) No date: PMB (postmenopausal bleeding) No date: Raynaud disease No date: Rectocele No date: SUI (stress urinary incontinence, female) No date: Unstable bladder No date: Urination frequency No date: Uterus descensus     Comment:  cervix descends to mid vagina No date: Vaginal atrophy No date: Vaginal itching   Reproductive/Obstetrics negative OB ROS                             Anesthesia Physical Anesthesia Plan  ASA: II  Anesthesia Plan: General   Post-op Pain Management:    Induction: Intravenous  PONV Risk Score and Plan: 3 and Propofol infusion  Airway Management Planned: Nasal Cannula  Additional Equipment:   Intra-op  Plan:   Post-operative Plan:   Informed Consent: I have reviewed the patients History and Physical, chart, labs and discussed the procedure including the risks, benefits and alternatives for the proposed anesthesia with the patient or authorized representative who has indicated his/her understanding and acceptance.   Dental Advisory Given  Plan Discussed with: Anesthesiologist, CRNA and Surgeon  Anesthesia Plan Comments:         Anesthesia Quick Evaluation

## 2017-07-16 NOTE — H&P (Signed)
Outpatient short stay form Pre-procedure 07/16/2017 12:14 PM Lollie Sails MD  Primary Physician: Dr. Maryland Pink  Reason for visit: Colonoscopy  History of present illness: Patient is a 67 year old female presenting today as above.  Is a screening colonoscopy.  She has no issues with rectal bleeding or abdominal pain or chorea.  Does have a history of chronic constipation.  Tolerating her prep well.  She does take Plavix which is been held for 8 days.  He also takes 81 mg aspirin that is also been held.  She takes no other aspirin products or blood thinning agent.    Current Facility-Administered Medications:  .  0.9 %  sodium chloride infusion, , Intravenous, Continuous, Lollie Sails, MD, Last Rate: 20 mL/hr at 07/16/17 1201, 1,000 mL at 07/16/17 1201  Medications Prior to Admission  Medication Sig Dispense Refill Last Dose  . Cholecalciferol 2000 units CAPS Take by mouth.     . diphenhydrAMINE (BENADRYL) 25 mg capsule Take 25 mg by mouth every 6 (six) hours as needed.     Marland Kitchen EPINEPHrine 0.3 mg/0.3 mL IJ SOAJ injection Inject into the muscle once.     Marland Kitchen estradiol (ESTRACE) 0.1 MG/GM vaginal cream Place 1 Applicatorful vaginally at bedtime.     . Multiple Vitamins-Minerals (CENTRUM SILVER 50+WOMEN) TABS Take by mouth.     Marland Kitchen acetaminophen (TYLENOL) 650 MG CR tablet Take 1,300 mg by mouth daily as needed for pain.   09/11/2016 at Unknown time  . amLODipine (NORVASC) 2.5 MG tablet Take 2.5 mg by mouth daily.   09/12/2016 at 1200  . aspirin EC 325 MG EC tablet Take 1 tablet (325 mg total) by mouth daily with breakfast. Take a full dose 325 mg Aspirin daily for a total of three weeks.  After three weeks, stop the full dose aspirin and then resume the low dose 81 mg Aspirin daily at home. 21 tablet 0 07/08/2017  . clopidogrel (PLAVIX) 75 MG tablet Take 75 mg by mouth daily.   07/08/2017  . Guaifenesin (MUCINEX MAXIMUM STRENGTH) 1200 MG TB12 Take 1,200 mg by mouth daily as needed  (congestion).   09/11/2016 at Unknown time  . loratadine (CLARITIN) 10 MG tablet Take 10 mg by mouth daily as needed for allergies.   08/29/2016 at Unknown time  . methocarbamol (ROBAXIN) 500 MG tablet Take 1 tablet (500 mg total) by mouth every 6 (six) hours as needed for muscle spasms. 80 tablet 0   . metoprolol tartrate (LOPRESSOR) 25 MG tablet Take 12.5 mg by mouth 2 (two) times daily.   09/12/2016 at 1200  . oxyCODONE (OXY IR/ROXICODONE) 5 MG immediate release tablet Take 1-2 tablets (5-10 mg total) by mouth every 4 (four) hours as needed for moderate pain or severe pain. 60 tablet 0   . ranitidine (ZANTAC) 150 MG tablet Take 150 mg by mouth daily as needed for heartburn.   09/11/2016 at Unknown time  . rosuvastatin (CRESTOR) 5 MG tablet Take 5 mg by mouth at bedtime.   09/05/2016  . tolterodine (DETROL) 2 MG tablet Take 2 mg by mouth daily.   09/12/2016 at 1200  . traMADol (ULTRAM) 50 MG tablet Take 1-2 tablets (50-100 mg total) by mouth every 6 (six) hours as needed for moderate pain. 56 tablet 0   . venlafaxine XR (EFFEXOR-XR) 75 MG 24 hr capsule Take 75 mg by mouth daily.   09/12/2016 at 1200     Allergies  Allergen Reactions  . Vancomycin  Causes RED MANS SYNDROME; per Allergist; may be administered with these precautions: "pleas pre-treat patient with benadryl 50mg  prior to dosing. Run in the infusion at a slower rate . Run the infusion over at least 2 hours or longer."  . Albuterol   . Biaxin [Clarithromycin]     Gi pain, jittery  . Cephalosporins Other (See Comments)  . Doxycycline Other (See Comments)    Jittery   . Levaquin [Levofloxacin In D5w] Other (See Comments)    Unknown   . Motrin [Ibuprofen] Other (See Comments)    In high doses, unknown reaction  . Omnicef [Cefdinir]   . Sulfa Antibiotics Hives    Hypotension, chills   . Amoxicillin Rash    Has patient had a PCN reaction causing immediate rash, facial/tongue/throat swelling, SOB or lightheadedness with hypotension:  Yes Has patient had a PCN reaction causing severe rash involving mucus membranes or skin necrosis: No Has patient had a PCN reaction that required hospitalization: No Has patient had a PCN reaction occurring within the last 10 years: No If all of the above answers are "NO", then may proceed with Cephalosporin use.   . Cefuroxime Palpitations    Anxiety   . Ciprofloxacin Palpitations    anxiety  . Clindamycin/Lincomycin Rash and Other (See Comments)    Dizzy, light headed     Past Medical History:  Diagnosis Date  . Anxiety   . Connective tissue disease ([place])   . Cystocele   . Eczema   . Hypertension   . Myocardial infarction ([place])   . PMB (postmenopausal bleeding)   . Raynaud disease   . Rectocele   . SUI (stress urinary incontinence, female)   . Unstable bladder   . Urination frequency   . 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 Vaginal atrophy   . Vaginal itching     Review of systems:      Physical Exam    Heart and lungs: Regular rate and rhythm without rub or gallop, lungs are bilaterally clear.    HEENT: Normocephalic atraumatic eyes are anicteric    Other:    Pertinant exam for procedure: Soft nontender nondistended bowel sounds positive normoactive    Planned proceedures: Colonoscopy and indicated procedures. I have discussed the risks benefits and complications of procedures to include not limited to bleeding, infection, perforation and the risk of sedation and the patient wishes to proceed.    Lollie Sails, MD Gastroenterology 07/16/2017  12:14 PM

## 2017-07-16 NOTE — Transfer of Care (Signed)
Immediate Anesthesia Transfer of Care Note  Patient: [name]  Procedure(s) Performed: COLONOSCOPY WITH PROPOFOL (N/A )  Patient Location: PACU  Anesthesia Type:General  Level of Consciousness: awake and sedated  Airway & Oxygen Therapy: Patient Spontanous Breathing and Patient connected to nasal cannula oxygen  Post-op Assessment: Report given to RN and Post -op Vital signs reviewed and stable  Post vital signs: Reviewed and stable  Last Vitals:  Vitals Value Taken Time  BP    Temp    Pulse    Resp    SpO2      Last Pain:  Vitals:   07/16/17 1138  TempSrc: Tympanic  PainSc: 0-No pain         Complications: No apparent anesthesia complications

## 2017-07-17 LAB — SURGICAL PATHOLOGY

## 2017-07-18 ENCOUNTER — Encounter: Payer: Self-pay | Admitting: Gastroenterology

## 2017-09-20 DIAGNOSIS — M329 Systemic lupus erythematosus, unspecified: Secondary | ICD-10-CM | POA: Insufficient documentation

## 2017-10-31 ENCOUNTER — Other Ambulatory Visit: Payer: Self-pay | Admitting: Student

## 2017-10-31 ENCOUNTER — Other Ambulatory Visit: Payer: Self-pay | Admitting: Family Medicine

## 2017-10-31 DIAGNOSIS — Z1231 Encounter for screening mammogram for malignant neoplasm of breast: Secondary | ICD-10-CM

## 2017-11-11 ENCOUNTER — Ambulatory Visit
Admission: RE | Admit: 2017-11-11 | Discharge: 2017-11-11 | Disposition: A | Discharge status: Home / Self Care | Payer: Medicare Other | Source: Ambulatory Visit | Attending: Family Medicine | Admitting: Family Medicine

## 2017-11-11 DIAGNOSIS — Z1231 Encounter for screening mammogram for malignant neoplasm of breast: Secondary | ICD-10-CM | POA: Diagnosis present

## 2018-04-24 ENCOUNTER — Emergency Department: Payer: Medicare Other

## 2018-04-24 ENCOUNTER — Other Ambulatory Visit: Payer: Self-pay

## 2018-04-24 ENCOUNTER — Emergency Department
Admission: EM | Admit: 2018-04-24 | Discharge: 2018-04-24 | Disposition: A | Discharge status: Home / Self Care | Payer: Medicare Other | Attending: Emergency Medicine | Admitting: Emergency Medicine

## 2018-04-24 ENCOUNTER — Encounter: Payer: Self-pay | Admitting: Emergency Medicine

## 2018-04-24 DIAGNOSIS — M542 Cervicalgia: Secondary | ICD-10-CM | POA: Diagnosis present

## 2018-04-24 DIAGNOSIS — Z7982 Long term (current) use of aspirin: Secondary | ICD-10-CM | POA: Insufficient documentation

## 2018-04-24 DIAGNOSIS — Z87891 Personal history of nicotine dependence: Secondary | ICD-10-CM | POA: Insufficient documentation

## 2018-04-24 DIAGNOSIS — I10 Essential (primary) hypertension: Secondary | ICD-10-CM | POA: Diagnosis not present

## 2018-04-24 DIAGNOSIS — R079 Chest pain, unspecified: Secondary | ICD-10-CM | POA: Insufficient documentation

## 2018-04-24 DIAGNOSIS — Z79899 Other long term (current) drug therapy: Secondary | ICD-10-CM | POA: Diagnosis not present

## 2018-04-24 DIAGNOSIS — M5412 Radiculopathy, cervical region: Secondary | ICD-10-CM | POA: Insufficient documentation

## 2018-04-24 LAB — CBC
HCT: 41.4 % (ref 36.0–46.0)
Hemoglobin: 13.7 g/dL (ref 12.0–15.0)
MCH: 30.4 pg (ref 26.0–34.0)
MCHC: 33.1 g/dL (ref 30.0–36.0)
MCV: 92 fL (ref 80.0–100.0)
Platelets: 261 10*3/uL (ref 150–400)
RBC: 4.5 MIL/uL (ref 3.87–5.11)
RDW: 13.6 % (ref 11.5–15.5)
WBC: 7.4 10*3/uL (ref 4.0–10.5)
nRBC: 0 % (ref 0.0–0.2)

## 2018-04-24 LAB — BASIC METABOLIC PANEL
Anion gap: 9 (ref 5–15)
BUN: 19 mg/dL (ref 8–23)
CO2: 25 mmol/L (ref 22–32)
Calcium: 9.7 mg/dL (ref 8.9–10.3)
Chloride: 105 mmol/L (ref 98–111)
Creatinine, Ser: 0.88 mg/dL (ref 0.44–1.00)
GFR calc Af Amer: >60 mL/min (ref >60)
GFR calc non Af Amer: >60 mL/min (ref >60)
GLUCOSE: 83 mg/dL (ref 70–99)
Potassium: 3.8 mmol/L (ref 3.5–5.1)
SODIUM: 139 mmol/L (ref 135–145)

## 2018-04-24 LAB — TROPONIN I
Troponin I: 0.05 ng/mL (ref <0.03)
Troponin I: 0.04 ng/mL (ref <0.03)

## 2018-04-24 MED ORDER — NITROGLYCERIN 0.4 MG SL SUBL: 0.4000 mg | SUBLINGUAL_TABLET | SUBLINGUAL | Status: DC | PRN

## 2018-04-24 MED ORDER — ACETAMINOPHEN 500 MG PO TABS
1000.0000 mg | ORAL_TABLET | Freq: Once | ORAL | Status: AC
  Administered 2018-04-24: 1000 mg via ORAL
  Filled 2018-04-24: qty 2

## 2018-04-24 NOTE — ED Triage Notes (Signed)
Patient presents to the ED with neck pain x 1 week, headache x 2 days, increased fatigue and bilateral arm and left sided chest pain since yesterday day.  Patient report pain is causing her to have difficulty sleeping.

## 2018-04-24 NOTE — ED Notes (Signed)
Pt denies CP, neck pain, dizziness, SHOB. Pt c/o of right arm pain 7/10.

## 2018-04-24 NOTE — ED Notes (Addendum)
Refer to triage note: pt c/o "chest discomfort" 4/10 and headache 6/10. Pt states she has been having chest pain for the past 2 days. Lefft sided CP   That woke her up  last night, L/CP radiates across neck and right arm, along with  with dizziness and "pulsating" on her eyes; denies SHOB. Pt is A/Ox4. NAD noted at this time. This RN will continue to monitor pt.

## 2018-04-24 NOTE — Discharge Instructions (Addendum)
Your blood tests were okay today.  Your neck x-ray does show signs of significant arthritis.  Please follow-up with your orthopedic surgeon for further evaluation.  If you have weakness or numbness in the arm, or worsening chest pain or symptoms that get worse with walking and exertion, please return to the emergency room for repeat assessment.

## 2018-04-24 NOTE — ED Provider Notes (Signed)
[facility] Emergency Department Provider Note  ____________________________________________  Time seen: Approximately 1:16 PM  I have reviewed the triage vital signs and the nursing notes.   HISTORY  Chief Complaint Neck Pain and Chest Pain    HPI Accalia Rigdon is a 68 y.o. female with a history of anxiety, hypertension, MI, connective tissue disease who complains of neck pain in the back of the neck diffusely, ongoing for the past week, worse on the right and radiating into the right arm.  Contrary to the triage note, she specifically denies any chest pain to me.  She denies any exertional symptoms, not pleuritic.  She does have a generalized headache, without vision changes weakness or paresthesias.  Not thunderclap or sudden in onset, waxing and waning, worse with neck movement and change in position.  Denies any recent falls or trauma.      Past Medical History:  Diagnosis Date  . Anxiety   . Connective tissue disease ([place])   . Cystocele   . Eczema   . Hypertension   . Myocardial infarction ([place])   . PMB (postmenopausal bleeding)   . Raynaud disease   . Rectocele   . SUI (stress urinary incontinence, female)   . Unstable bladder   . Urination frequency   . 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 Vaginal atrophy   . Vaginal itching      Patient Active Problem List   Diagnosis Date Noted  . OA (osteoarthritis) of hip 09/12/2016  . History of ST elevation myocardial infarction (STEMI) 01/23/2016  . Recurrent urinary tract infection 07/07/2014  . Raynaud's disease 07/07/2014  . Chronic coronary artery disease 08/31/2013  . Connective tissue disease ([place]) 08/31/2013     Past Surgical History:  Procedure Laterality Date  . ABDOMINAL HYSTERECTOMY  01/19/2014   tah/bso  . CARDIAC CATHETERIZATION  2010, 06/2016  . CESAREAN SECTION  1986  . COLONOSCOPY WITH PROPOFOL N/A 07/16/2017   Procedure: COLONOSCOPY WITH PROPOFOL;  Surgeon:  Lollie Sails, MD;  Location: [facility] ENDOSCOPY;  Service: Endoscopy;  Laterality: N/A;  . cystotomy repair  01/18/2014  . JOINT REPLACEMENT Right 2018  . LEFT HEART CATH AND CORONARY ANGIOGRAPHY Left 07/03/2016   Procedure: Left Heart Cath and Coronary Angiography;  Surgeon: Teodoro Spray, MD;  Location: [facility];  Service: Cardiovascular;  Laterality: Left;  . Recurrent UTI    . TONSILLECTOMY  1969  . TOTAL HIP ARTHROPLASTY Right 09/12/2016   Procedure: RIGHT TOTAL HIP ARTHROPLASTY ANTERIOR APPROACH;  Surgeon: Gaynelle Arabian, MD;  Location: [facility] ORS;  Service: Orthopedics;  Laterality: Right;  . TUBAL LIGATION  1986     Prior to Admission medications   Medication Sig Start Date End Date Taking? Authorizing Provider  acetaminophen (TYLENOL) 650 MG CR tablet Take 1,300 mg by mouth daily as needed for pain.    [provider]  amLODipine (NORVASC) 2.5 MG tablet Take 2.5 mg by mouth daily.    [provider]  aspirin EC 325 MG EC tablet Take 1 tablet (325 mg total) by mouth daily with breakfast. Take a full dose 325 mg Aspirin daily for a total of three weeks.  After three weeks, stop the full dose aspirin and then resume the low dose 81 mg Aspirin daily at home. 09/13/16   Perkins, Alexzandrew L, PA-C  Cholecalciferol 2000 units CAPS Take by mouth.    [provider]  clopidogrel (PLAVIX) 75 MG tablet Take 75  mg by mouth daily.    [provider]  diphenhydrAMINE (BENADRYL) 25 mg capsule Take 25 mg by mouth every 6 (six) hours as needed.    [provider]  EPINEPHrine 0.3 mg/0.3 mL IJ SOAJ injection Inject into the muscle once.    [provider]  estradiol (ESTRACE) 0.1 MG/GM vaginal cream Place 1 Applicatorful vaginally at bedtime.    [provider]  Guaifenesin (MUCINEX MAXIMUM STRENGTH) 1200 MG TB12 Take 1,200 mg by mouth daily as needed (congestion).    [provider]  loratadine (CLARITIN) 10 MG tablet  Take 10 mg by mouth daily as needed for allergies.    [provider]  methocarbamol (ROBAXIN) 500 MG tablet Take 1 tablet (500 mg total) by mouth every 6 (six) hours as needed for muscle spasms. 09/13/16   Perkins, Alexzandrew L, PA-C  metoprolol tartrate (LOPRESSOR) 25 MG tablet Take 12.5 mg by mouth 2 (two) times daily.    [provider]  Multiple Vitamins-Minerals (CENTRUM SILVER 50+WOMEN) TABS Take by mouth.    [provider]  oxyCODONE (OXY IR/ROXICODONE) 5 MG immediate release tablet Take 1-2 tablets (5-10 mg total) by mouth every 4 (four) hours as needed for moderate pain or severe pain. 09/13/16   Perkins, Alexzandrew L, PA-C  ranitidine (ZANTAC) 150 MG tablet Take 150 mg by mouth daily as needed for heartburn.    [provider]  rosuvastatin (CRESTOR) 5 MG tablet Take 5 mg by mouth at bedtime.    [provider]  tolterodine (DETROL) 2 MG tablet Take 2 mg by mouth daily.    [provider]  traMADol (ULTRAM) 50 MG tablet Take 1-2 tablets (50-100 mg total) by mouth every 6 (six) hours as needed for moderate pain. 09/13/16   Perkins, Alexzandrew L, PA-C  venlafaxine XR (EFFEXOR-XR) 75 MG 24 hr capsule Take 75 mg by mouth daily.    [provider]     Allergies Ceftriaxone; Ciprofloxacin-dexamethasone; Clindamycin; Sulfa antibiotics; Penicillins; Vancomycin; Albuterol; Cefuroxime axetil; Cephalosporins; Clarithromycin; Doxycycline; Levaquin [levofloxacin in d5w]; Levofloxacin; Motrin [ibuprofen]; Naproxen sodium; Amoxicillin; Cefdinir; Cefuroxime; Cephalexin; Ciprofloxacin; and Clindamycin/lincomycin   Family History  Problem Relation Age of Onset  . Breast cancer Neg Hx     Social History Social History   Tobacco Use  . Smoking status: Former Smoker    Last attempt to quit: 02/13/1983    Years since quitting: 35.2  . Smokeless tobacco: Never Used  Substance Use Topics  . Alcohol use: Yes    Alcohol/week: 7.0 standard  drinks    Types: 7 Glasses of wine per week  . Drug use: No    Review of Systems  Constitutional:   No fever or chills.  ENT:   No sore throat. No rhinorrhea. Cardiovascular:   No chest pain or syncope. Respiratory:   No dyspnea or cough. Gastrointestinal:   Negative for abdominal pain, vomiting and diarrhea.  Musculoskeletal:   Positive neck pain as above All other systems reviewed and are negative except as documented above in ROS and HPI.  ____________________________________________   PHYSICAL EXAM:  VITAL SIGNS: ED Triage Vitals  Enc Vitals Group     BP 04/24/18 1024 130/67     Pulse Rate 04/24/18 1024 75     Resp 04/24/18 1024 20     Temp 04/24/18 1024 97.7 F (36.5 C)     Temp Source 04/24/18 1024 Oral     SpO2 04/24/18 1024 96 %     Weight 04/24/18  1023 160 lb (72.6 kg)     Height 04/24/18 1023 5\' 3"  (1.6 m)     Head Circumference --      Peak Flow --      Pain Score 04/24/18 1027 2     Pain Loc --      Pain Edu? --      Excl. in [place]? --     Vital signs reviewed, nursing assessments reviewed.   Constitutional:   Alert and oriented. Non-toxic appearance.  Smiling and pleasant Eyes:   Conjunctivae are normal. EOMI. PERRL. ENT      Head:   Normocephalic and atraumatic.      Nose:   No congestion/rhinnorhea.       Mouth/Throat:   MMM, no pharyngeal erythema. No peritonsillar mass.       Neck:   No meningismus. Full ROM. Hematological/Lymphatic/Immunilogical:   No cervical lymphadenopathy. Cardiovascular:   RRR. Symmetric bilateral radial and DP pulses.  No murmurs. Cap refill less than 2 seconds. Respiratory:   Normal respiratory effort without tachypnea/retractions. Breath sounds are clear and equal bilaterally. No wheezes/rales/rhonchi. Gastrointestinal:   Soft and nontender. Non distended. There is no CVA tenderness.  No rebound, rigidity, or guarding.  Musculoskeletal:   Normal range of motion in all extremities. No joint effusions.  No lower extremity  tenderness.  No edema.  There is stiffness in bilateral trapezius muscles, greater on the right.  Also tender to the touch in the trapezius which she reports reproduces her pain.  Moving neck to extremes of motion also causes feeling of pain on the right side that radiates to the arm. Neurologic:   Normal speech and language.  Motor grossly intact. No acute focal neurologic deficits are appreciated.  Skin:    Skin is warm, dry and intact. No rash noted.  No petechiae, purpura, or bullae.  ____________________________________________    LABS (pertinent positives/negatives) (all labs ordered are listed, but only abnormal results are displayed) Labs Reviewed  TROPONIN I - Abnormal; Notable for the following components:      Result Value   Troponin I 0.05 (*)    All other components within normal limits  TROPONIN I - Abnormal; Notable for the following components:   Troponin I 0.04 (*)    All other components within normal limits  BASIC METABOLIC PANEL  CBC   ____________________________________________   EKG  Interpreted by me Normal sinus rhythm rate of 72, normal axis and intervals.  Normal QRS ST segments and T waves.  ____________________________________________    [id number]  Dg Chest 2 View  Result Date: 04/24/2018 CLINICAL DATA:  Neck pain.  Headache.  Fatigue.  Chest pain. EXAM: CHEST - 2 VIEW COMPARISON:  02/02/2014.  01/28/2014. FINDINGS: Heart size is normal. Coronary artery stents are noted. Mediastinal shadows are normal. No evidence of edema, infiltrate or lobar collapse. Mild scarring or atelectasis at both lung bases. On the lateral view, there is an 8 mm nodule projected over the spine which was not seen in 2015. This could be a developing pulmonary nodule. Chest CT would be recommended at some point. IMPRESSION: Mild scarring or atelectasis at both lung bases. Coronary artery stents. Newly seen 8 mm nodular shadow projected over the spine, not present in 2015. This  could represent a new pulmonary nodule. Chest CT would be suggested at some point. Electronically Signed   By: Nelson Chimes M.D.   On: 04/24/2018 11:00   Dg Cervical Spine Complete  Result Date: 04/24/2018 CLINICAL DATA:  Neck pain for 1 week. EXAM: CERVICAL SPINE - COMPLETE 4+ VIEW COMPARISON:  None. FINDINGS: There is no evidence of cervical spine fracture or prevertebral soft tissue swelling. Alignment is normal. Multilevel osteoarthritic changes with disc space narrowing, endplate sclerosis, remodeling of vertebral bodies and small osteophyte formation. Similar mild posterior facet arthropathy. The changes are most prominent at C6-C7 and C5-C6. IMPRESSION: 1. No acute fracture or dislocation identified about the cervical spine. 2. Multilevel osteoarthritic changes. Electronically Signed   By: Fidela Salisbury M.D.   On: 04/24/2018 13:19    ____________________________________________   PROCEDURES Procedures  ____________________________________________  DIFFERENTIAL DIAGNOSIS   Trapezius strain, cervical radiculopathy, degenerative disease of the cervical spine, arthritis  CLINICAL IMPRESSION / ASSESSMENT AND PLAN / ED COURSE  Medications ordered in the ED: Medications  acetaminophen (TYLENOL) tablet 1,000 mg (1,000 mg Oral Given 04/24/18 1230)    Pertinent labs & imaging results that were available during my care of the patient were reviewed by me and considered in my medical decision making (see chart for details).    Patient presents with radiating neck pain, exam consistent with musculoskeletal.  Vital signs are normal, EKG is unremarkable.  Exam is reassuring.  Doubt ACS PE dissection or carotid or vertebral artery occlusion.  Patient is overall calm and well-appearing.  X-ray of the cervical spine ordered to evaluate degeneration.  She already has an orthopedist that she sees for chronic left hip arthritis who she will also start seeing for the neck as well.  Advised she may  need referral to neurosurgery if symptoms continue to progress.  Serum labs are unremarkable.  She has a troponin of 0.05 which I suspect to be chronic due to the lack of cardiac symptoms today and her history of a STEMI in 2017.  I will repeat the troponin to ensure that it is not trending upward.  Afterward she will be stable for discharge home.  Clinical Course as of Apr 23 1536  Thu Apr 24, 2018  1536 Repeat troponin decreased.  Not consistent with ACS.  Stable for discharge home, follow-up with orthopedics regarding her degenerative cervical disease.   [PS]    Clinical Course User Index [PS] Carrie Mew, MD     ____________________________________________   FINAL CLINICAL IMPRESSION(S) / ED DIAGNOSES    Final diagnoses:  Neck pain  Cervical radiculopathy     ED Discharge Orders    None      Portions of this note were generated with dragon dictation software. Dictation errors may occur despite best attempts at proofreading.   Carrie Mew, MD 04/24/18 1538

## 2018-04-24 NOTE — ED Notes (Signed)
Pt alert and oriented X4, active, cooperative, pt in NAD. RR even and unlabored, color WNL.  Pt informed to return if any life threatening symptoms occur.  Discharge and followup instructions reviewed. Ambulates safely. 

## 2018-05-29 DIAGNOSIS — I10 Essential (primary) hypertension: Secondary | ICD-10-CM | POA: Insufficient documentation

## 2018-12-22 ENCOUNTER — Other Ambulatory Visit: Payer: Self-pay | Admitting: Family Medicine

## 2018-12-22 DIAGNOSIS — Z1231 Encounter for screening mammogram for malignant neoplasm of breast: Secondary | ICD-10-CM

## 2018-12-24 ENCOUNTER — Ambulatory Visit
Admission: RE | Admit: 2018-12-24 | Discharge: 2018-12-24 | Disposition: A | Discharge status: Home / Self Care | Payer: Medicare Other | Source: Ambulatory Visit | Attending: Family Medicine | Admitting: Family Medicine

## 2018-12-24 DIAGNOSIS — Z1231 Encounter for screening mammogram for malignant neoplasm of breast: Secondary | ICD-10-CM | POA: Insufficient documentation

## 2019-08-03 ENCOUNTER — Other Ambulatory Visit: Payer: Self-pay | Admitting: Neurology

## 2019-08-03 DIAGNOSIS — H532 Diplopia: Secondary | ICD-10-CM

## 2019-08-03 DIAGNOSIS — H518 Other specified disorders of binocular movement: Secondary | ICD-10-CM

## 2019-08-26 ENCOUNTER — Other Ambulatory Visit: Payer: Self-pay

## 2019-08-26 ENCOUNTER — Ambulatory Visit
Admission: RE | Admit: 2019-08-26 | Discharge: 2019-08-26 | Disposition: A | Discharge status: Home / Self Care | Payer: Medicare PPO | Source: Ambulatory Visit | Attending: Neurology | Admitting: Neurology

## 2019-08-26 DIAGNOSIS — H518 Other specified disorders of binocular movement: Secondary | ICD-10-CM | POA: Insufficient documentation

## 2019-08-26 DIAGNOSIS — H532 Diplopia: Secondary | ICD-10-CM | POA: Insufficient documentation

## 2020-03-09 DIAGNOSIS — G2581 Restless legs syndrome: Secondary | ICD-10-CM | POA: Insufficient documentation

## 2020-05-26 ENCOUNTER — Other Ambulatory Visit: Payer: Self-pay | Admitting: Family Medicine

## 2020-05-26 DIAGNOSIS — Z1231 Encounter for screening mammogram for malignant neoplasm of breast: Secondary | ICD-10-CM

## 2020-06-24 ENCOUNTER — Ambulatory Visit
Admission: RE | Admit: 2020-06-24 | Discharge: 2020-06-24 | Disposition: A | Discharge status: Home / Self Care | Payer: Medicare PPO | Source: Ambulatory Visit | Attending: Family Medicine | Admitting: Family Medicine

## 2020-06-24 ENCOUNTER — Other Ambulatory Visit: Payer: Self-pay

## 2020-06-24 DIAGNOSIS — Z1231 Encounter for screening mammogram for malignant neoplasm of breast: Secondary | ICD-10-CM | POA: Insufficient documentation

## 2020-12-07 ENCOUNTER — Ambulatory Visit: Payer: Self-pay | Admitting: Orthopedic Surgery

## 2020-12-07 DIAGNOSIS — M48062 Spinal stenosis, lumbar region with neurogenic claudication: Secondary | ICD-10-CM

## 2020-12-15 ENCOUNTER — Ambulatory Visit: Payer: Self-pay | Admitting: Orthopedic Surgery

## 2020-12-15 NOTE — H&P (View-Only) (Signed)
[name] is an 70 y.o. female.   Chief Complaint: back and leg pain HPI: Reason for Visit: (normal) review of test results (lumbar MRI); 4 1/2 weeks Location (Lower Extremity): lower back pain on the right; leg pain on the right, , ; foot pain on the right, , Severity: pain level 7/10 Quality: sharp; stabbing Aggravating Factors: walking for Associated Symptoms: no numbness/tingling; no weakness Medications: helping a little; Ibuprofen Patient is limited ambulatory range.  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   Vaginal atrophy    Vaginal itching     Past Surgical History:  Procedure Laterality Date   ABDOMINAL HYSTERECTOMY  01/19/2014   tah/bso   CARDIAC CATHETERIZATION  2010, 06/2016   CESAREAN SECTION  1986   COLONOSCOPY WITH PROPOFOL N/A 07/16/2017   Procedure: COLONOSCOPY WITH PROPOFOL;  Surgeon: Lollie Sails, MD;  Location: [facility] ENDOSCOPY;  Service: Endoscopy;  Laterality: N/A;   cystotomy repair  01/18/2014   JOINT REPLACEMENT Right 2018   LEFT HEART CATH AND CORONARY ANGIOGRAPHY Left 07/03/2016   Procedure: Left Heart Cath and Coronary Angiography;  Surgeon: Teodoro Spray, MD;  Location: [facility];  Service: Cardiovascular;  Laterality: Left;   Recurrent UTI     TONSILLECTOMY  1969   TOTAL HIP ARTHROPLASTY Right 09/12/2016   Procedure: RIGHT TOTAL HIP ARTHROPLASTY ANTERIOR APPROACH;  Surgeon: Gaynelle Arabian, MD;  Location: [facility] ORS;  Service: Orthopedics;  Laterality: Right;   TUBAL LIGATION  1986    Family History  Problem Relation Age of Onset   Breast cancer Neg Hx    Social History:  reports that she quit smoking about 37  years ago. She has never used smokeless tobacco. She reports current alcohol use of about 7.0 standard drinks per week. She reports that she does not use drugs.  Allergies:  Allergies  Allergen Reactions   Ceftriaxone     Other reaction(s): Confusion/Altered Mental Status "feels like I'm coming out of my skin"   Ciprofloxacin-Dexamethasone Palpitations   Sulfa Antibiotics Hives    Hypotension, chills  Other reaction(s): Photosensitivity, Unknown   Penicillins Hives   Vancomycin     Causes RED MANS SYNDROME; per Allergist; may be administered with these precautions: "pleas pre-treat patient with benadryl 50mg  prior to dosing. Run in the infusion at a slower rate . Run the infusion over at least 2 hours or longer."   Albuterol     Other reaction(s): Unknown    Cephalosporins Other (See Comments)    Other reaction(s): Unknown   Clarithromycin     Gi pain, jittery   Doxycycline Other (See Comments)    Jittery     Levofloxacin     Other reaction(s): Unknown   Motrin [Ibuprofen] Other (See Comments)    In high doses, unknown reaction   Naproxen Sodium     Other reaction(s): UNKNOWN   Oxycodone Hcl Nausea And Vomiting    Bad Dreams   Amoxicillin Rash    Has patient had a PCN reaction causing immediate rash, facial/tongue/throat swelling, SOB or lightheadedness with hypotension: Yes  Has patient had a PCN reaction causing severe rash involving mucus membranes or skin necrosis: No Has patient had a PCN reaction that required hospitalization: No Has patient had a PCN reaction occurring within the last 10 years: No If all of the above answers are "NO", then may proceed with Cephalosporin use.    Cefdinir Rash   Cefuroxime Anxiety and Palpitations    Other reaction(s): HEADACHE   Cephalexin Nausea And Vomiting   Ciprofloxacin Anxiety and Palpitations        Clindamycin/Lincomycin Rash    Dizzy, light headed    (Not in a hospital admission)   No results found for this or any  previous visit (from the past 48 hour(s)). No results found.  Review of Systems  Constitutional: Negative.   HENT: Negative.    Eyes: Negative.   Respiratory: Negative.    Cardiovascular: Negative.   Gastrointestinal: Negative.   Genitourinary: Negative.   Musculoskeletal:  Positive for back pain, gait problem and myalgias.  Neurological:  Positive for weakness and numbness.   There were no vitals taken for this visit. Physical Exam Constitutional:      Appearance: Normal appearance.  HENT:     Head: Normocephalic.     Right Ear: External ear normal.     Left Ear: External ear normal.     Nose: Nose normal.     Mouth/Throat:     Pharynx: Oropharynx is clear.  Eyes:     Conjunctiva/sclera: Conjunctivae normal.  Cardiovascular:     Rate and Rhythm: Normal rate and regular rhythm.     Pulses: Normal pulses.  Pulmonary:     Effort: Pulmonary effort is normal.     Breath sounds: Normal breath sounds.  Abdominal:     General: Bowel sounds are normal.  Musculoskeletal:     Cervical back: Normal range of motion.     Comments: Straight leg raise low back pain. Motor is 5/5 lower extremities. Sensory exam is intact  Neurological:     Mental Status: She is alert.    MRI demonstrates severe stenosis at L4-5 with redundancy of the nerve roots facet arthropathies and short pedicles. Moderate foraminal stenosis at L5-S1. 6 mm diameter. Small disc protrusion.  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 Also patient reports history of diagnosis of idiopathic peripheral neuropathy without history of diabetes.  Plan:  We discussed conservative versus operative options. Avoiding extension favoring flexion etc. She is significantly limited in ambulatory capacity and with radicular symptoms its reasonable to consider a decompression at L4-5. 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 Symptoms of been over 6 months of duration with conservative treatment being none therapeutic We discussed decompression I had an extensive discussion with the patient concerning the pathology relevant anatomy and treatment options. At this point exhausting conservative treatment and in the presence of a neurologic deficit we discussed microlumbar decompression. I discussed the risks and benefits including bleeding, infection, DVT, PE, anesthetic complications, worsening in their symptoms, improvement in their symptoms, C SF leakage, epidural fibrosis, need for future surgeries such as revision discectomy and lumbar fusion. I also indicated that this is an operation to basically decompress the nerve root to allow recovery as opposed to fixing a herniated disc if iy is encountered and that the incidence of recurrent chest disc herniation can approach 15%. Also that nerve root recovery is variable and may not recover completely.  I discussed the operative course including overnight in the hospital. Immediate ambulation. Follow-up in 2 weeks for suture removal. 6 weeks until healing of the herniation followed by 6 weeks of reconditioning and strengthening of the core musculature. Also discussed the need to employ the concepts of disc pressure management and core motion following the surgery to minimize the risk of recurrent disc herniation. We will obtain preoperative clearance i if necessary and proceed accordingly.  No history of DVT or PE or MRSA. She reports hydrocodone tolerable unable to tolerate oxycodone. Kefzol.  She is going to [place] for a trip. I gave her the result of her MRI report to take and a steroid Dosepak and gabapentin to be taken if she has an exacerbation A prescription for a steroid dose pack was given to reduce inflammation. 5 mg tablets to be taken over a 6 a day period of time in decreasing doses. The packaging provides guidance.  In addition, a refill was given to be taken if persistent or recurrent pain is present. Maximum number of Sterapred dose packs in a year's period of time is approximately three. Any anti-inflammatory medication should be discontinued while using this dose pack but can be resumed following its completion. Side effects may include fluid retention, insomnia, and hyperactivity. If you are a diabetic monitor your blood glucose levels as steroids typically elevate those. You may need to supplement your current diabetes medication regiment accordingly. It is advised to consult with your medical physician for this.  She has taken gabapentin in the past for her peripheral neuropathy once a day  Preoperative clearance  Patient is to call or present to the emergency room if there is any numbness or weakness to suggest worsening of their condition. In addition although rare if there is loss of bowel or bladder function such as incontinence or inability to void that this may represent a cauda equina syndrome. If noted the patient is to present immediately to the emergency room for evaluation and treatment otherwise permanent bowel or bladder dysfunction can occur as a result.  Plan microlumbar decompression L4-5  Cecilie Kicks, PA-C for Dr Tonita Cong 12/15/2020, 1:49 PM

## 2020-12-15 NOTE — H&P (Signed)
[name] is an 70 y.o. female.   Chief Complaint: back and leg pain HPI: Reason for Visit: (normal) review of test results (lumbar MRI); 4 1/2 weeks Location (Lower Extremity): lower back pain on the right; leg pain on the right, , ; foot pain on the right, , Severity: pain level 7/10 Quality: sharp; stabbing Aggravating Factors: walking for Associated Symptoms: no numbness/tingling; no weakness Medications: helping a little; Ibuprofen Patient is limited ambulatory range.  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   Vaginal atrophy    Vaginal itching     Past Surgical History:  Procedure Laterality Date   ABDOMINAL HYSTERECTOMY  01/19/2014   [place]   CARDIAC CATHETERIZATION  2010, 06/2016   CESAREAN SECTION  1986   COLONOSCOPY WITH PROPOFOL N/A 07/16/2017   Procedure: COLONOSCOPY WITH PROPOFOL;  Surgeon: Lollie Sails, MD;  Location: [facility] ENDOSCOPY;  Service: Endoscopy;  Laterality: N/A;   cystotomy repair  01/18/2014   JOINT REPLACEMENT Right 2018   LEFT HEART CATH AND CORONARY ANGIOGRAPHY Left 07/03/2016   Procedure: Left Heart Cath and Coronary Angiography;  Surgeon: Teodoro Spray, MD;  Location: [facility];  Service: Cardiovascular;  Laterality: Left;   Recurrent UTI     TONSILLECTOMY  1969   TOTAL HIP ARTHROPLASTY Right 09/12/2016   Procedure: RIGHT TOTAL HIP ARTHROPLASTY ANTERIOR APPROACH;  Surgeon: Gaynelle Arabian, MD;  Location: [facility] OR[facility];  Service: Orthopedics;  Laterality: Right;   TUBAL LIGATION  1986    Family History  Problem Relation Age of Onset   Breast cancer Neg Hx    Social History:  reports that she quit smoking about 37  years ago. She has never used smokeless tobacco. She reports current alcohol use of about 7.0 standard drinks per week. She reports that she does not use drugs.  Allergies:  Allergies  Allergen Reactions   Ceftriaxone     Other reaction(s): Confusion/Altered Mental Status "feels like I'm coming out of my skin"   Ciprofloxacin-Dexamethasone Palpitations   Sulfa Antibiotics Hives    Hypotension, chills  Other reaction(s): Photosensitivity, Unknown   Penicillins Hives   Vancomycin     Causes RED MANS SYNDROME; per Allergist; may be administered with these precautions: "pleas pre-treat patient with benadryl 50mg  prior to dosing. Run in the infusion at a slower rate . Run the infusion over at least 2 hours or longer."   Albuterol     Other reaction(s): Unknown    Cephalosporins Other (See Comments)    Other reaction(s): Unknown   Clarithromycin     Gi pain, jittery   Doxycycline Other (See Comments)    Jittery     Levofloxacin     Other reaction(s): Unknown   Motrin [Ibuprofen] Other (See Comments)    In high doses, unknown reaction   Naproxen Sodium     Other reaction(s): UNKNOWN   Oxycodone Hcl Nausea And Vomiting    Bad Dreams   Amoxicillin Rash    Has patient had a PCN reaction causing immediate rash, facial/tongue/throat swelling, SOB or lightheadedness with hypotension: Yes  Has patient had a PCN reaction causing severe rash involving mucus membranes or skin necrosis: No Has patient had a PCN reaction that required hospitalization: No Has patient had a PCN reaction occurring within the last 10 years: No If all of the above answers are "NO", then may proceed with Cephalosporin use.    Cefdinir Rash   Cefuroxime Anxiety and Palpitations    Other reaction(s): HEADACHE   Cephalexin Nausea And Vomiting   Ciprofloxacin Anxiety and Palpitations        Clindamycin/Lincomycin Rash    Dizzy, light headed    (Not in a hospital admission)   No results found for this or any  previous visit (from the past 48 hour(s)). No results found.  Review of Systems  Constitutional: Negative.   HENT: Negative.    Eyes: Negative.   Respiratory: Negative.    Cardiovascular: Negative.   Gastrointestinal: Negative.   Genitourinary: Negative.   Musculoskeletal:  Positive for back pain, gait problem and myalgias.  Neurological:  Positive for weakness and numbness.   There were no vitals taken for this visit. Physical Exam Constitutional:      Appearance: Normal appearance.  HENT:     Head: Normocephalic.     Right Ear: External ear normal.     Left Ear: External ear normal.     Nose: Nose normal.     Mouth/Throat:     Pharynx: Oropharynx is clear.  Eyes:     Conjunctiva/sclera: Conjunctivae normal.  Cardiovascular:     Rate and Rhythm: Normal rate and regular rhythm.     Pulses: Normal pulses.  Pulmonary:     Effort: Pulmonary effort is normal.     Breath sounds: Normal breath sounds.  Abdominal:     General: Bowel sounds are normal.  Musculoskeletal:     Cervical back: Normal range of motion.     Comments: Straight leg raise low back pain. Motor is 5/5 lower extremities. Sensory exam is intact  Neurological:     Mental Status: She is alert.    MRI demonstrates severe stenosis at L4-5 with redundancy of the nerve roots facet arthropathies and short pedicles. Moderate foraminal stenosis at L5-S1. 6 mm diameter. Small disc protrusion.  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 Also patient reports history of diagnosis of idiopathic peripheral neuropathy without history of diabetes.  Plan:  We discussed conservative versus operative options. Avoiding extension favoring flexion etc. She is significantly limited in ambulatory capacity and with radicular symptoms its reasonable to consider a decompression at L4-5. 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 Symptoms of been over 6 months of duration with conservative treatment being none therapeutic We discussed decompression I had an extensive discussion with the patient concerning the pathology relevant anatomy and treatment options. At this point exhausting conservative treatment and in the presence of a neurologic deficit we discussed microlumbar decompression. I discussed the risks and benefits including bleeding, infection, DVT, PE, anesthetic complications, worsening in their symptoms, improvement in their symptoms, C SF leakage, epidural fibrosis, need for future surgeries such as revision discectomy and lumbar fusion. I also indicated that this is an operation to basically decompress the nerve root to allow recovery as opposed to fixing a herniated disc if iy is encountered and that the incidence of recurrent chest disc herniation can approach 15%. Also that nerve root recovery is variable and may not recover completely.  I discussed the operative course including overnight in the hospital. Immediate ambulation. Follow-up in 2 weeks for suture removal. 6 weeks until healing of the herniation followed by 6 weeks of reconditioning and strengthening of the core musculature. Also discussed the need to employ the concepts of disc pressure management and core motion following the surgery to minimize the risk of recurrent disc herniation. We will obtain preoperative clearance i if necessary and proceed accordingly.  No history of DVT or PE or MRSA. She reports hydrocodone tolerable unable to tolerate oxycodone. Kefzol.  She is going to [place] for a trip. I gave her the result of her MRI report to take and a steroid Dosepak and gabapentin to be taken if she has an exacerbation A prescription for a steroid dose pack was given to reduce inflammation. 5 mg tablets to be taken over a 6 a day period of time in decreasing doses. The packaging provides guidance.  In addition, a refill was given to be taken if persistent or recurrent pain is present. Maximum number of Sterapred dose packs in a year's period of time is approximately three. Any anti-inflammatory medication should be discontinued while using this dose pack but can be resumed following its completion. Side effects may include fluid retention, insomnia, and hyperactivity. If you are a diabetic monitor your blood glucose levels as steroids typically elevate those. You may need to supplement your current diabetes medication regiment accordingly. It is advised to consult with your medical physician for this.  She has taken gabapentin in the past for her peripheral neuropathy once a day  Preoperative clearance  Patient is to call or present to the emergency room if there is any numbness or weakness to suggest worsening of their condition. In addition although rare if there is loss of bowel or bladder function such as incontinence or inability to void that this may represent a cauda equina syndrome. If noted the patient is to present immediately to the emergency room for evaluation and treatment otherwise permanent bowel or bladder dysfunction can occur as a result.  Plan microlumbar decompression L4-5  Cecilie Kicks, PA-C for Dr Tonita Cong 12/15/2020, 1:49 PM

## 2020-12-16 NOTE — Progress Notes (Addendum)
Surgical Instructions    Your procedure is scheduled on Thursday November 10th.  Report to [facility] Main Entrance "A" at 5:30 A.M., then check in with the Admitting office.  Call this number if you have problems the morning of surgery:  [phone]   If you have any questions prior to your surgery date call [phone]: Open Monday-Friday 8am-4pm    Remember:  Do not eat after midnight the night before your surgery  You may drink clear liquids until 4:30am the morning of your surgery.   Clear liquids allowed are: Water, Non-Citrus Juices (without pulp), Carbonated Beverages, Clear Tea, Black Coffee ONLY (NO MILK, CREAM OR POWDERED CREAMER of any kind), and Gatorade    Take these medicines the morning of surgery with A SIP OF WATER amLODipine (NORVASC) 2.5 MG tablet gabapentin (NEURONTIN) 300 MG capsule loratadine (CLARITIN) 10 MG tablet metoprolol tartrate (LOPRESSOR) 25 MG tablet mirabegron ER (MYRBETRIQ) 50 MG TB24 tablet omeprazole (PRILOSEC) 20 MG capsule venlafaxine XR (EFFEXOR-XR) 75 MG 24 hr capsule  IF NEEDED  acetaminophen (TYLENOL) 500 MG tablet diazepam (VALIUM) 10 MG tablet diphenhydrAMINE (BENADRYL) 25 mg capsule EPINEPHrine 0.3 mg/0.3 mL IJ SOAJ injection fluticasone (FLONASE) 50 MCG/ACT nasal spray Guaifenesin 1200 MG TB12 methocarbamol (ROBAXIN) 500 MG tablet  Follow your surgeon's instructions on when to stop Aspirin.  If no instructions were given by your surgeon then you will need to call the office to get those instructions.    As of today, STOP taking any Aspirin (unless otherwise instructed by your surgeon) Aleve, Naproxen, Ibuprofen, Motrin, Advil, Goody's, BC's, all herbal medications, fish oil, and all vitamins.     After your COVID test   You are not required to quarantine however you are required to wear a well-fitting mask when you are out and around people not in your household.  If your mask becomes wet or soiled, replace with a new  one.  Wash your hands often with soap and water for 20 seconds or clean your hands with an alcohol-based hand sanitizer that contains at least 60% alcohol.  Do not share personal items.  Notify your provider: if you are in close contact with someone who has COVID  or if you develop a fever of 100.4 or greater, sneezing, cough, sore throat, shortness of breath or body aches.             Do not wear jewelry or makeup Do not wear lotions, powders, perfumes, or deodorant. Do not shave 48 hours prior to surgery.   Do not bring valuables to the hospital. DO Not wear nail polish, gel polish, artificial nails, or any other type of covering on natural nails including finger and toenails. If patients have artificial nails, gel coating, etc. that need to be removed by a nail salon, please have this removed prior to surgery or surgery may need to be canceled/delayed if the surgeon/ anesthesia feels like the patient is unable to be adequately monitored.             [facility] is not responsible for any belongings or valuables.  Do NOT Smoke (Tobacco/Vaping)  24 hours prior to your procedure  If you use a CPAP at night, you may bring your mask for your overnight stay.   Contacts, glasses, hearing aids, dentures or partials may not be worn into surgery, please bring cases for these belongings   For patients admitted to the hospital, discharge time will be determined by your treatment team.   Patients discharged  the day of surgery will not be allowed to drive home, and someone needs to stay with them for 24 hours.  NO VISITORS WILL BE ALLOWED IN PRE-OP WHERE PATIENTS ARE PREPPED FOR SURGERY.  ONLY 1 SUPPORT PERSON MAY BE PRESENT IN THE WAITING ROOM WHILE YOU ARE IN SURGERY.  IF YOU ARE TO BE ADMITTED, ONCE YOU ARE IN YOUR ROOM YOU WILL BE ALLOWED TWO (2) VISITORS. 1 (ONE) VISITOR MAY STAY OVERNIGHT BUT MUST ARRIVE TO THE ROOM BY 8pm.  Minor children may have two parents present. Special  consideration for safety and communication needs will be reviewed on a case by case basis.  Special instructions:    Oral Hygiene is also important to reduce your risk of infection.  Remember - BRUSH YOUR TEETH THE MORNING OF SURGERY WITH YOUR REGULAR TOOTHPASTE   [facility]- Preparing For Surgery  Before surgery, you can play an important role. Because skin is not sterile, your skin needs to be as free of germs as possible. You can reduce the number of germs on your skin by washing with CHG (chlorahexidine gluconate) Soap before surgery.  CHG is an antiseptic cleaner which kills germs and bonds with the skin to continue killing germs even after washing.     Please do not use if you have an allergy to CHG or antibacterial soaps. If your skin becomes reddened/irritated stop using the CHG.  Do not shave (including legs and underarms) for at least 48 hours prior to first CHG shower. It is OK to shave your face.  Please follow these instructions carefully.     Shower the NIGHT BEFORE SURGERY and the MORNING OF SURGERY with CHG Soap.   If you chose to wash your hair, wash your hair first as usual with your normal shampoo. After you shampoo, rinse your hair and body thoroughly to remove the shampoo.  Then [employer] and genitals (private parts) with your normal soap and rinse thoroughly to remove soap.  After that Use CHG Soap as you would any other liquid soap. You can apply CHG directly to the skin and wash gently with a scrungie or a clean washcloth.   Apply the CHG Soap to your body ONLY FROM THE NECK DOWN.  Do not use on open wounds or open sores. Avoid contact with your eyes, ears, mouth and genitals (private parts). Wash Face and genitals (private parts)  with your normal soap.   Wash thoroughly, paying special attention to the area where your surgery will be performed.  Thoroughly rinse your body with warm water from the neck down.  DO NOT shower/wash with your normal soap after using  and rinsing off the CHG Soap.  Pat yourself dry with a CLEAN TOWEL.  Wear CLEAN PAJAMAS to bed the night before surgery  Place CLEAN SHEETS on your bed the night before your surgery  DO NOT SLEEP WITH PETS.   Day of Surgery:  Take a shower with CHG soap. Wear Clean/Comfortable clothing the morning of surgery Do not apply any deodorants/lotions.   Remember to brush your teeth WITH YOUR REGULAR TOOTHPASTE.   Please read over the following fact sheets that you were given.

## 2020-12-19 ENCOUNTER — Encounter (HOSPITAL_COMMUNITY)
Admission: RE | Admit: 2020-12-19 | Discharge: 2020-12-19 | Disposition: A | Discharge status: Home / Self Care | Payer: Medicare PPO | Source: Ambulatory Visit | Attending: Specialist | Admitting: Specialist

## 2020-12-19 ENCOUNTER — Encounter (HOSPITAL_COMMUNITY): Payer: Self-pay

## 2020-12-19 ENCOUNTER — Other Ambulatory Visit: Payer: Self-pay

## 2020-12-19 ENCOUNTER — Ambulatory Visit (HOSPITAL_COMMUNITY)
Admission: RE | Admit: 2020-12-19 | Discharge: 2020-12-19 | Disposition: A | Discharge status: Home / Self Care | Payer: Medicare PPO | Source: Ambulatory Visit | Attending: Orthopedic Surgery | Admitting: Orthopedic Surgery

## 2020-12-19 VITALS — BP 155/77 | HR 66 | Temp 97.7°F | Resp 17 | Ht 63.0 in | Wt 165.6 lb

## 2020-12-19 DIAGNOSIS — M47816 Spondylosis without myelopathy or radiculopathy, lumbar region: Secondary | ICD-10-CM | POA: Insufficient documentation

## 2020-12-19 DIAGNOSIS — M48062 Spinal stenosis, lumbar region with neurogenic claudication: Secondary | ICD-10-CM

## 2020-12-19 DIAGNOSIS — Z01818 Encounter for other preprocedural examination: Secondary | ICD-10-CM

## 2020-12-19 DIAGNOSIS — M5126 Other intervertebral disc displacement, lumbar region: Secondary | ICD-10-CM | POA: Diagnosis not present

## 2020-12-19 DIAGNOSIS — Z20822 Contact with and (suspected) exposure to covid-19: Secondary | ICD-10-CM | POA: Insufficient documentation

## 2020-12-19 DIAGNOSIS — Z87891 Personal history of nicotine dependence: Secondary | ICD-10-CM | POA: Diagnosis not present

## 2020-12-19 HISTORY — DX: Urinary tract infection, site not specified: N39.0

## 2020-12-19 HISTORY — DX: Atherosclerotic heart disease of native coronary artery without angina pectoris: I25.10

## 2020-12-19 LAB — SARS CORONAVIRUS 2 (TAT 6-24 HRS): SARS Coronavirus 2: NEGATIVE

## 2020-12-19 LAB — BASIC METABOLIC PANEL
Anion gap: 6 (ref 5–15)
BUN: 13 mg/dL (ref 8–23)
CO2: 28 mmol/L (ref 22–32)
Calcium: 9.6 mg/dL (ref 8.9–10.3)
Chloride: 105 mmol/L (ref 98–111)
Creatinine, Ser: 0.81 mg/dL (ref 0.44–1.00)
GFR, Estimated: >60 mL/min (ref >60)
Glucose, Bld: 83 mg/dL (ref 70–99)
Potassium: 4.3 mmol/L (ref 3.5–5.1)
Sodium: 139 mmol/L (ref 135–145)

## 2020-12-19 LAB — CBC
HCT: 43.4 % (ref 36.0–46.0)
Hemoglobin: 14.6 g/dL (ref 12.0–15.0)
MCH: 31.9 pg (ref 26.0–34.0)
MCHC: 33.6 g/dL (ref 30.0–36.0)
MCV: 95 fL (ref 80.0–100.0)
Platelets: 275 10*3/uL (ref 150–400)
RBC: 4.57 MIL/uL (ref 3.87–5.11)
RDW: 14.1 % (ref 11.5–15.5)
WBC: 8.8 10*3/uL (ref 4.0–10.5)
nRBC: 0 % (ref 0.0–0.2)

## 2020-12-19 LAB — SURGICAL PCR SCREEN
MRSA, PCR: NEGATIVE
Staphylococcus aureus: NEGATIVE

## 2020-12-19 NOTE — Progress Notes (Addendum)
PCP - [place] [facility] Cardiologist - Bartholome Bill  Chest x-ray - Not indicated EKG - 12/19/20 Stress Test -Less than a year 2021 follows with Dr. Ubaldo Glassing at [facility] - Denies Cardiac Cath - 07/03/16  Sleep Study - Denies  DM - Denies  Aspirin Instructions: INstructed by surgeon to stop 1 week prior  ERAS Protcol - Yes PRE-SURGERY Ensure given   COVID TEST- 12/19/20  Anesthesia review: Yes, cardiac history  Patient denies shortness of breath, fever, cough and chest pain at PAT appointment  All instructions explained to the patient, with a verbal understanding of the material. Patient agrees to go over the instructions while at home for a better understanding. Patient also instructed to wear a mask while in public after being tested for COVID-19. The opportunity to ask questions was provided.

## 2020-12-20 ENCOUNTER — Encounter (HOSPITAL_COMMUNITY): Payer: Self-pay

## 2020-12-20 NOTE — Anesthesia Preprocedure Evaluation (Deleted)
Anesthesia Evaluation    Airway        Dental   Pulmonary former smoker,           Cardiovascular hypertension,   Nuclear stress test 06/15/20 ([facility]): FINDINGS:  Regional wall motion: reveals normal myocardial thickening and wall  motion.  The overall quality of the study is good.   Left ventricular cavity: normal.  Perfusion Analysis: SPECT images demonstrate homogeneous tracer  distribution throughout the myocardium. Defect type: Normal IMPRESSION: Negative ETT with fair exercise tolerance. LV function normal. LVEF 62%. No evidence of a reversible ischemia. Low risk study.   Neuro/Psych    GI/Hepatic   Endo/Other    Renal/GU      Musculoskeletal   Abdominal   Peds  Hematology   Anesthesia Other Findings   Reproductive/Obstetrics                             Anesthesia Physical Anesthesia Plan  ASA:   Anesthesia Plan:    Post-op Pain Management:    Induction:   PONV Risk Score and Plan:   Airway Management Planned:   Additional Equipment:   Intra-op Plan:   Post-operative Plan:   Informed Consent:   Plan Discussed with:   Anesthesia Plan Comments: (PAT note written 12/20/2020 by Myra Gianotti, PA-C. )        Anesthesia Quick Evaluation

## 2020-12-20 NOTE — Progress Notes (Addendum)
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  Patient had surgical clearance note from Dr. Kary Kos with EKG. She reported ASA on hold for one week prior to surgery.   She had cardiology follow-up with Dr. Ubaldo Glassing within the past six months. Low rist stress test 06/2020.  She denied shortness of breath, cough, fever, chest pain at PAT RN visit.  12/19/2020 presurgical COVID-19 test negative.  Anesthesia team to evaluate on the day of surgery.   VS: BP (!) 155/77   Pulse 66   Temp 36.5 C (Oral)   Resp 17   Ht 5\' 3"  (1.6 m)   Wt 75.1 kg   SpO2 99%   BMI 29.33 kg/m    PROVIDERS: [place] Pink, MD is PCP [facility] [place], [place])  Bartholome Bill, MD is cardiologist. Vita Barley last office visit 07/14/20, although note not currently available. 05/25/20 note available. Stress test ordered for atypical chest pain, and was low risk on 06/15/20.    Manuella Ghazi. Hemang, MD is neurologist [facility], [place]). 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   Leigh Aurora, MD is rheumatologist  Tonette Lederer, MD is pulmonologist ([place]). 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 One year follow-up to continue monitoring for new/worsening symptoms or worsening PFTs.  Sherryll Burger, MD is urologist ([place]). Last visit 03/12/19 for recurrent UTI, high tone pelvic floor dysfunction, overactive bladder.     LABS: Labs reviewed: Acceptable for surgery. A1c 6/1% 11/22/20 (Care Everywhere). (all labs ordered are listed, but only abnormal results are displayed)  Labs Reviewed  SURGICAL PCR SCREEN  SARS CORONAVIRUS 2 (TAT 6-24 HRS)  CBC  BASIC METABOLIC PANEL    Spirometry 04/20/20 [facility]): FVC 2.49, FEV1 1.79, FEV1/FVC 71.90%, DLCO 12.78. Comparison 02/19/17: FVC 2.41 (82.7%), FEV1 1.73 (77.8%), FEV1/FVC 71.6%, DLCO 6.24 (92%).   IMAGES: Xray L-spine 117/22: IMPRESSION: 1. Mild broad-based dextroscoliosis. Lower lumbar facet hypertrophy and mild spondylosis. 2. Mild T11 superior endplate compression fracture, likely chronic but progressive loss of height from 04/24/2018 chest radiograph.   EKG:  EKG 12/19/20: NSR EKG 11/22/20 ([facility]): NSR. Possible inferior infarct, age undetermined. No significant change from 05/2020 EKG. Negative stress test in May (per notation by Dr. Kary Kos).  CV: Nuclear stress test 06/15/20 ([facility]): FINDINGS:  Regional wall motion:  reveals normal myocardial thickening and wall  motion.  The overall  quality of the study is good.    Left ventricular cavity: normal.  Perfusion Analysis:  SPECT images demonstrate homogeneous tracer   distribution throughout the myocardium. Defect type:  Normal IMPRESSION: Negative ETT with fair exercise tolerance.  LV function normal.  LVEF 62%. No evidence of a reversible ischemia.  Low risk study.   Cardiac cath 07/03/16: Mid RCA to Dist RCA lesion, 0 %stenosed. Dist LAD lesion, 0 %stenosed. The left ventricular systolic function is normal. LV end diastolic pressure is normal. No evidence of progression of disease. Lad stent patent, RCA stent patent. No evidence of prgression of disease in lcx , rca or lad.  EF normal.   Echo 01/03/16 ([facility]): Summary   Normal left ventricular wall thickness, cavity size, and systolic   function.   Normal regional wall motion   Left ventricular ejection fraction is 55 - 60%.   Normal left ventricular diastolic filling pattern.   Normal right ventricular size and function.   The left atrium is normal in size.   No significant valve abnormalities.    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   Vaginal atrophy    Vaginal itching     Past Surgical History:  Procedure Laterality Date   ABDOMINAL HYSTERECTOMY  01/19/2014   [place]/bso   CARDIAC CATHETERIZATION  2010, 06/2016   CESAREAN SECTION  1986   COLONOSCOPY WITH PROPOFOL N/A 07/16/2017   Procedure: COLONOSCOPY WITH PROPOFOL;  Surgeon: Lollie Sails, MD;  Location: [facility] ENDOSCOPY;  Service: Endoscopy;  Laterality: N/A;   cystotomy repair  01/18/2014   JOINT REPLACEMENT Right 2018   LEFT HEART CATH AND CORONARY ANGIOGRAPHY Left 07/03/2016   Procedure: Left Heart Cath and Coronary Angiography;  Surgeon: Teodoro Spray, MD;  Location: [facility] LAB;  Service: Cardiovascular;  Laterality: Left;   Recurrent UTI     TONSILLECTOMY  1969   TOTAL HIP ARTHROPLASTY Right 09/12/2016   Procedure: RIGHT TOTAL HIP ARTHROPLASTY ANTERIOR APPROACH;  Surgeon: Gaynelle Arabian, MD;  Location: [facility] ORS;  Service: Orthopedics;  Laterality: Right;   TUBAL LIGATION  1986    MEDICATIONS:  acetaminophen (TYLENOL) 500 MG tablet   amLODipine (NORVASC) 2.5 MG tablet   aspirin EC 325 MG EC tablet   aspirin EC 81 MG tablet   diazepam (VALIUM) 10 MG tablet   diphenhydrAMINE (BENADRYL) 25 mg capsule   EPINEPHrine 0.3 mg/0.3 mL IJ SOAJ injection   estradiol (ESTRACE) 0.1 MG/GM vaginal cream   fluticasone (FLONASE) 50 MCG/ACT nasal spray   folic acid (FOLVITE) 1 MG tablet   gabapentin (NEURONTIN) 300 MG capsule   Guaifenesin 1200 MG TB12   ibuprofen (ADVIL) 200 MG tablet   L-Lysine 500 MG TABS   loratadine (CLARITIN) 10 MG tablet   methocarbamol (ROBAXIN) 500 MG tablet   methotrexate 2.5 MG tablet   metoprolol tartrate (LOPRESSOR) 25 MG tablet   mirabegron ER (MYRBETRIQ) 50 MG TB24 tablet   Multiple Minerals (CALCIUM-MAGNESIUM-ZINC) TABS   omeprazole (PRILOSEC) 20 MG capsule  oxyCODONE (OXY IR/ROXICODONE) 5 MG immediate release tablet   pyridOXINE (VITAMIN B-6) 100 MG tablet   rosuvastatin (CRESTOR) 5 MG tablet   traMADol (ULTRAM) 50 MG tablet   venlafaxine XR (EFFEXOR-XR) 75 MG 24 hr capsule   No current facility-administered medications for this encounter.    Myra Gianotti, PA-C Surgical Short Stay/Anesthesiology [facility] Phone [phone] [facility] Phone [phone] 12/20/2020 5:18 PM

## 2020-12-21 MED ORDER — GENTAMICIN SULFATE 40 MG/ML IJ SOLN
1.5000 mg/kg | INTRAVENOUS | Status: AC
  Administered 2020-12-22: 110 mg via INTRAVENOUS
  Filled 2020-12-21 (×3): qty 2.75

## 2020-12-22 ENCOUNTER — Other Ambulatory Visit: Payer: Self-pay

## 2020-12-22 ENCOUNTER — Encounter (HOSPITAL_COMMUNITY)
Admission: RE | Disposition: A | Discharge status: Home / Self Care | Payer: Self-pay | Source: Home / Self Care | Attending: Specialist

## 2020-12-22 ENCOUNTER — Ambulatory Visit (HOSPITAL_COMMUNITY)
Admission: RE | Admit: 2020-12-22 | Discharge: 2020-12-23 | Disposition: A | Discharge status: Home / Self Care | Payer: Medicare PPO | Attending: Specialist | Admitting: Specialist

## 2020-12-22 ENCOUNTER — Ambulatory Visit (HOSPITAL_COMMUNITY): Payer: Medicare PPO

## 2020-12-22 ENCOUNTER — Ambulatory Visit (HOSPITAL_COMMUNITY): Payer: Medicare PPO | Admitting: Certified Registered Nurse Anesthetist

## 2020-12-22 ENCOUNTER — Ambulatory Visit (HOSPITAL_COMMUNITY): Payer: Medicare PPO | Admitting: Vascular Surgery

## 2020-12-22 ENCOUNTER — Encounter (HOSPITAL_COMMUNITY): Payer: Self-pay | Admitting: Specialist

## 2020-12-22 DIAGNOSIS — Z87891 Personal history of nicotine dependence: Secondary | ICD-10-CM | POA: Insufficient documentation

## 2020-12-22 DIAGNOSIS — M48061 Spinal stenosis, lumbar region without neurogenic claudication: Secondary | ICD-10-CM | POA: Diagnosis present

## 2020-12-22 DIAGNOSIS — I10 Essential (primary) hypertension: Secondary | ICD-10-CM | POA: Insufficient documentation

## 2020-12-22 DIAGNOSIS — Z419 Encounter for procedure for purposes other than remedying health state, unspecified: Secondary | ICD-10-CM

## 2020-12-22 DIAGNOSIS — M48062 Spinal stenosis, lumbar region with neurogenic claudication: Secondary | ICD-10-CM | POA: Diagnosis not present

## 2020-12-22 HISTORY — PX: LUMBAR LAMINECTOMY/DECOMPRESSION MICRODISCECTOMY: SHX5026

## 2020-12-22 LAB — HEMOGLOBIN A1C
Hgb A1c MFr Bld: 5.9 % — ABNORMAL HIGH (ref 4.8–5.6)
Mean Plasma Glucose: 122.63 mg/dL

## 2020-12-22 SURGERY — LUMBAR LAMINECTOMY/DECOMPRESSION MICRODISCECTOMY 1 LEVEL
Anesthesia: General | Site: Back

## 2020-12-22 MED ORDER — GUAIFENESIN ER 600 MG PO TB12
1200.0000 mg | ORAL_TABLET | Freq: Every day | ORAL | Status: DC | PRN
  Filled 2020-12-22: qty 2

## 2020-12-22 MED ORDER — PANTOPRAZOLE SODIUM 40 MG PO TBEC
40.0000 mg | DELAYED_RELEASE_TABLET | Freq: Every day | ORAL | Status: DC
  Administered 2020-12-22: 40 mg via ORAL
  Filled 2020-12-22: qty 1

## 2020-12-22 MED ORDER — VENLAFAXINE HCL ER 75 MG PO CP24: 75.0000 mg | ORAL_CAPSULE | Freq: Every day | ORAL | Status: DC

## 2020-12-22 MED ORDER — PROPOFOL 10 MG/ML IV BOLUS
INTRAVENOUS | Status: DC | PRN
  Administered 2020-12-22: 130 mg via INTRAVENOUS

## 2020-12-22 MED ORDER — LACTATED RINGERS IV SOLN: INTRAVENOUS | Status: DC

## 2020-12-22 MED ORDER — HYDROCODONE-ACETAMINOPHEN 5-325 MG PO TABS: 1.0000 | ORAL_TABLET | ORAL | 0 refills | Status: DC | PRN

## 2020-12-22 MED ORDER — THROMBIN 20000 UNITS EX SOLR: CUTANEOUS | Status: DC | PRN

## 2020-12-22 MED ORDER — EPHEDRINE SULFATE-NACL 50-0.9 MG/10ML-% IV SOSY
PREFILLED_SYRINGE | INTRAVENOUS | Status: DC | PRN
Start: 2020-12-22 — End: 2020-12-22
  Administered 2020-12-22 (×3): 10 mg via INTRAVENOUS
  Administered 2020-12-22: 5 mg via INTRAVENOUS

## 2020-12-22 MED ORDER — DEXAMETHASONE SODIUM PHOSPHATE 10 MG/ML IJ SOLN
INTRAMUSCULAR | Status: DC | PRN
  Administered 2020-12-22: 10 mg via INTRAVENOUS

## 2020-12-22 MED ORDER — ROCURONIUM BROMIDE 10 MG/ML (PF) SYRINGE
PREFILLED_SYRINGE | INTRAVENOUS | Status: AC
  Filled 2020-12-22: qty 10

## 2020-12-22 MED ORDER — FENTANYL CITRATE (PF) 100 MCG/2ML IJ SOLN
INTRAMUSCULAR | Status: DC | PRN
  Administered 2020-12-22: 100 ug via INTRAVENOUS
  Administered 2020-12-22 (×3): 50 ug via INTRAVENOUS

## 2020-12-22 MED ORDER — GABAPENTIN 300 MG PO CAPS
300.0000 mg | ORAL_CAPSULE | Freq: Every day | ORAL | Status: DC
  Administered 2020-12-22: 300 mg via ORAL
  Filled 2020-12-22: qty 1

## 2020-12-22 MED ORDER — BUPIVACAINE-EPINEPHRINE 0.5% -1:200000 IJ SOLN
INTRAMUSCULAR | Status: AC
  Filled 2020-12-22: qty 1

## 2020-12-22 MED ORDER — MIDAZOLAM HCL 2 MG/2ML IJ SOLN
INTRAMUSCULAR | Status: AC
  Filled 2020-12-22: qty 2

## 2020-12-22 MED ORDER — POLYETHYLENE GLYCOL 3350 17 G PO PACK: 17.0000 g | PACK | Freq: Every day | ORAL | Status: DC | PRN

## 2020-12-22 MED ORDER — LIDOCAINE 2% (20 MG/ML) 5 ML SYRINGE
INTRAMUSCULAR | Status: AC
  Filled 2020-12-22: qty 5

## 2020-12-22 MED ORDER — LORATADINE 10 MG PO TABS: 10.0000 mg | ORAL_TABLET | Freq: Every day | ORAL | Status: DC

## 2020-12-22 MED ORDER — ONDANSETRON HCL 4 MG PO TABS: 4.0000 mg | ORAL_TABLET | Freq: Four times a day (QID) | ORAL | Status: DC | PRN

## 2020-12-22 MED ORDER — MIRABEGRON ER 50 MG PO TB24
50.0000 mg | ORAL_TABLET | Freq: Every day | ORAL | Status: DC
  Filled 2020-12-22: qty 1

## 2020-12-22 MED ORDER — FENTANYL CITRATE (PF) 100 MCG/2ML IJ SOLN: 25.0000 ug | INTRAMUSCULAR | Status: DC | PRN

## 2020-12-22 MED ORDER — ONDANSETRON HCL 4 MG/2ML IJ SOLN
INTRAMUSCULAR | Status: DC | PRN
  Administered 2020-12-22: 4 mg via INTRAVENOUS

## 2020-12-22 MED ORDER — DIPHENHYDRAMINE HCL 25 MG PO CAPS: 25.0000 mg | ORAL_CAPSULE | Freq: Four times a day (QID) | ORAL | Status: DC | PRN

## 2020-12-22 MED ORDER — INSULIN ASPART 100 UNIT/ML IJ SOLN: 0.0000 [IU] | Freq: Three times a day (TID) | INTRAMUSCULAR | Status: DC

## 2020-12-22 MED ORDER — VANCOMYCIN HCL IN DEXTROSE 1-5 GM/200ML-% IV SOLN
1000.0000 mg | INTRAVENOUS | Status: AC
  Administered 2020-12-22 (×2): 1000 mg via INTRAVENOUS
  Filled 2020-12-22: qty 200

## 2020-12-22 MED ORDER — VITAMIN B-6 100 MG PO TABS
100.0000 mg | ORAL_TABLET | Freq: Every day | ORAL | Status: DC
  Filled 2020-12-22: qty 1

## 2020-12-22 MED ORDER — 0.9 % SODIUM CHLORIDE (POUR BTL) OPTIME
TOPICAL | Status: DC | PRN
  Administered 2020-12-22: 1000 mL

## 2020-12-22 MED ORDER — DIAZEPAM 5 MG PO TABS
10.0000 mg | ORAL_TABLET | Freq: Every day | ORAL | Status: DC | PRN
  Administered 2020-12-22: 10 mg via ORAL
  Filled 2020-12-22: qty 2

## 2020-12-22 MED ORDER — BUPIVACAINE-EPINEPHRINE 0.5% -1:200000 IJ SOLN
INTRAMUSCULAR | Status: DC | PRN
  Administered 2020-12-22: 6 mL

## 2020-12-22 MED ORDER — SUGAMMADEX SODIUM 200 MG/2ML IV SOLN
INTRAVENOUS | Status: DC | PRN
Start: 2020-12-22 — End: 2020-12-22
  Administered 2020-12-22: 200 mg via INTRAVENOUS

## 2020-12-22 MED ORDER — FENTANYL CITRATE (PF) 250 MCG/5ML IJ SOLN
INTRAMUSCULAR | Status: AC
  Filled 2020-12-22: qty 5

## 2020-12-22 MED ORDER — POTASSIUM CHLORIDE IN NACL 20-0.9 MEQ/L-% IV SOLN: INTRAVENOUS | Status: DC

## 2020-12-22 MED ORDER — FLUTICASONE PROPIONATE 50 MCG/ACT NA SUSP: 2.0000 | Freq: Every day | NASAL | Status: DC | PRN

## 2020-12-22 MED ORDER — PHENYLEPHRINE HCL-NACL 20-0.9 MG/250ML-% IV SOLN
INTRAVENOUS | Status: DC | PRN
  Administered 2020-12-22: 30 ug/min via INTRAVENOUS

## 2020-12-22 MED ORDER — CHLORHEXIDINE GLUCONATE 0.12 % MT SOLN
15.0000 mL | Freq: Once | OROMUCOSAL | Status: AC
  Administered 2020-12-22: 15 mL via OROMUCOSAL
  Filled 2020-12-22: qty 15

## 2020-12-22 MED ORDER — AMLODIPINE BESYLATE 5 MG PO TABS: 2.5000 mg | ORAL_TABLET | Freq: Every day | ORAL | Status: DC

## 2020-12-22 MED ORDER — TRANEXAMIC ACID-NACL 1000-0.7 MG/100ML-% IV SOLN
INTRAVENOUS | Status: DC | PRN
  Administered 2020-12-22: 1000 mg via INTRAVENOUS

## 2020-12-22 MED ORDER — ONDANSETRON HCL 4 MG/2ML IJ SOLN: 4.0000 mg | Freq: Four times a day (QID) | INTRAMUSCULAR | Status: DC | PRN

## 2020-12-22 MED ORDER — ACETAMINOPHEN 10 MG/ML IV SOLN
1000.0000 mg | INTRAVENOUS | Status: AC
  Administered 2020-12-22: 1000 mg via INTRAVENOUS
  Filled 2020-12-22: qty 100

## 2020-12-22 MED ORDER — EPINEPHRINE 0.3 MG/0.3ML IJ SOAJ: 0.3000 mg | INTRAMUSCULAR | Status: DC | PRN

## 2020-12-22 MED ORDER — HYDROCODONE-ACETAMINOPHEN 5-325 MG PO TABS
1.0000 | ORAL_TABLET | ORAL | Status: DC | PRN
  Administered 2020-12-22 – 2020-12-23 (×3): 1 via ORAL
  Filled 2020-12-22 (×3): qty 1

## 2020-12-22 MED ORDER — EPHEDRINE 5 MG/ML INJ
INTRAVENOUS | Status: AC
  Filled 2020-12-22: qty 10

## 2020-12-22 MED ORDER — ROCURONIUM BROMIDE 10 MG/ML (PF) SYRINGE
PREFILLED_SYRINGE | INTRAVENOUS | Status: DC | PRN
  Administered 2020-12-22: 80 mg via INTRAVENOUS
  Administered 2020-12-22: 20 mg via INTRAVENOUS

## 2020-12-22 MED ORDER — POLYETHYLENE GLYCOL 3350 17 G PO PACK: 17.0000 g | PACK | Freq: Every day | ORAL | 0 refills | Status: AC

## 2020-12-22 MED ORDER — DIPHENHYDRAMINE HCL 50 MG/ML IJ SOLN
50.0000 mg | Freq: Once | INTRAMUSCULAR | Status: AC
  Administered 2020-12-22: 50 mg via INTRAVENOUS
  Filled 2020-12-22: qty 1

## 2020-12-22 MED ORDER — FOLIC ACID 1 MG PO TABS: 1.0000 mg | ORAL_TABLET | ORAL | Status: DC

## 2020-12-22 MED ORDER — ACETAMINOPHEN 500 MG PO TABS: 1000.0000 mg | ORAL_TABLET | Freq: Four times a day (QID) | ORAL | Status: DC | PRN

## 2020-12-22 MED ORDER — TRAMADOL HCL 50 MG PO TABS: 50.0000 mg | ORAL_TABLET | Freq: Four times a day (QID) | ORAL | Status: DC | PRN

## 2020-12-22 MED ORDER — DIPHENHYDRAMINE HCL 50 MG/ML IJ SOLN
50.0000 mg | Freq: Once | INTRAMUSCULAR | Status: AC
  Administered 2020-12-22: 50 mg via INTRAVENOUS
  Filled 2020-12-22 (×2): qty 1

## 2020-12-22 MED ORDER — PHENOL 1.4 % MT LIQD: 1.0000 | OROMUCOSAL | Status: DC | PRN

## 2020-12-22 MED ORDER — DOCUSATE SODIUM 100 MG PO CAPS: 100.0000 mg | ORAL_CAPSULE | Freq: Two times a day (BID) | ORAL | 1 refills | Status: AC | PRN

## 2020-12-22 MED ORDER — ORAL CARE MOUTH RINSE: 15.0000 mL | Freq: Once | OROMUCOSAL | Status: AC

## 2020-12-22 MED ORDER — LIDOCAINE 2% (20 MG/ML) 5 ML SYRINGE
INTRAMUSCULAR | Status: DC | PRN
  Administered 2020-12-22: 100 mg via INTRAVENOUS

## 2020-12-22 MED ORDER — VANCOMYCIN HCL 1000 MG/200ML IV SOLN
1000.0000 mg | Freq: Once | INTRAVENOUS | Status: AC
  Administered 2020-12-22: 1000 mg via INTRAVENOUS
  Filled 2020-12-22: qty 200

## 2020-12-22 MED ORDER — BISACODYL 5 MG PO TBEC: 5.0000 mg | DELAYED_RELEASE_TABLET | Freq: Every day | ORAL | Status: DC | PRN

## 2020-12-22 MED ORDER — HYDROMORPHONE HCL 1 MG/ML IJ SOLN: 0.5000 mg | INTRAMUSCULAR | Status: DC | PRN

## 2020-12-22 MED ORDER — MENTHOL 3 MG MT LOZG: 1.0000 | LOZENGE | OROMUCOSAL | Status: DC | PRN

## 2020-12-22 MED ORDER — DEXAMETHASONE SODIUM PHOSPHATE 10 MG/ML IJ SOLN
INTRAMUSCULAR | Status: AC
  Filled 2020-12-22: qty 1

## 2020-12-22 MED ORDER — METHOCARBAMOL 500 MG PO TABS
500.0000 mg | ORAL_TABLET | Freq: Four times a day (QID) | ORAL | Status: DC | PRN
  Administered 2020-12-22 – 2020-12-23 (×2): 500 mg via ORAL
  Filled 2020-12-22 (×2): qty 1

## 2020-12-22 MED ORDER — MIDAZOLAM HCL 2 MG/2ML IJ SOLN
INTRAMUSCULAR | Status: DC | PRN
  Administered 2020-12-22: 2 mg via INTRAVENOUS

## 2020-12-22 MED ORDER — DOCUSATE SODIUM 100 MG PO CAPS
100.0000 mg | ORAL_CAPSULE | Freq: Two times a day (BID) | ORAL | Status: DC
  Administered 2020-12-22 (×2): 100 mg via ORAL
  Filled 2020-12-22 (×2): qty 1

## 2020-12-22 MED ORDER — PROMETHAZINE HCL 25 MG/ML IJ SOLN: 6.2500 mg | INTRAMUSCULAR | Status: DC | PRN

## 2020-12-22 MED ORDER — THROMBIN 20000 UNITS EX SOLR
CUTANEOUS | Status: AC
  Filled 2020-12-22: qty 20000

## 2020-12-22 MED ORDER — METOPROLOL TARTRATE 25 MG PO TABS: 25.0000 mg | ORAL_TABLET | Freq: Every day | ORAL | Status: DC

## 2020-12-22 MED ORDER — ALUM & MAG HYDROXIDE-SIMETH 200-200-20 MG/5ML PO SUSP: 30.0000 mL | Freq: Four times a day (QID) | ORAL | Status: DC | PRN

## 2020-12-22 MED ORDER — CALCIUM-MAGNESIUM-ZINC PO TABS: 1.0000 | ORAL_TABLET | Freq: Every day | ORAL | Status: DC

## 2020-12-22 MED ORDER — ONDANSETRON HCL 4 MG/2ML IJ SOLN
INTRAMUSCULAR | Status: AC
  Filled 2020-12-22: qty 2

## 2020-12-22 MED ORDER — AMISULPRIDE (ANTIEMETIC) 5 MG/2ML IV SOLN: 10.0000 mg | Freq: Once | INTRAVENOUS | Status: DC | PRN

## 2020-12-22 SURGICAL SUPPLY — 57 items
BAG COUNTER SPONGE SURGICOUNT (BAG) ×2 IMPLANT
BAG DECANTER FOR FLEXI CONT (MISCELLANEOUS) IMPLANT
BAND RUBBER #18 3X1/16 STRL (MISCELLANEOUS) ×6 IMPLANT
BUR EGG ELITE 5.0 (BURR) IMPLANT
BUR RND DIAMOND ELITE 4.0 (BURR) ×2 IMPLANT
BUR STRYKR EGG 5.0 (BURR) IMPLANT
CARTRIDGE OIL MAESTRO DRILL (MISCELLANEOUS) ×1 IMPLANT
CLEANER TIP ELECTROSURG 2X2 (MISCELLANEOUS) ×2 IMPLANT
CNTNR URN SCR LID CUP LEK RST (MISCELLANEOUS) ×1 IMPLANT
CONT SPEC 4OZ STRL OR WHT (MISCELLANEOUS) ×1
DIFFUSER DRILL AIR PNEUMATIC (MISCELLANEOUS) ×2 IMPLANT
DRAPE LAPAROTOMY 100X72X124 (DRAPES) ×2 IMPLANT
DRAPE MICROSCOPE LEICA (MISCELLANEOUS) ×2 IMPLANT
DRAPE SHEET LG 3/4 BI-LAMINATE (DRAPES) ×2 IMPLANT
DRAPE SURG 17X11 SM STRL (DRAPES) ×2 IMPLANT
DRAPE UTILITY XL STRL (DRAPES) ×2 IMPLANT
DRSG AQUACEL AG ADV 3.5X 4 (GAUZE/BANDAGES/DRESSINGS) IMPLANT
DRSG AQUACEL AG ADV 3.5X 6 (GAUZE/BANDAGES/DRESSINGS) ×2 IMPLANT
DRSG TELFA 3X8 NADH (GAUZE/BANDAGES/DRESSINGS) IMPLANT
DURAPREP 26ML APPLICATOR (WOUND CARE) ×2 IMPLANT
DURASEAL SPINE SEALANT 3ML (MISCELLANEOUS) IMPLANT
ELECT BLADE 4.0 EZ CLEAN MEGAD (MISCELLANEOUS) ×2
ELECT REM PT RETURN 9FT ADLT (ELECTROSURGICAL) ×2
ELECTRODE BLDE 4.0 EZ CLN MEGD (MISCELLANEOUS) ×1 IMPLANT
ELECTRODE REM PT RTRN 9FT ADLT (ELECTROSURGICAL) ×1 IMPLANT
GLOVE SURG POLYISO LF SZ7.5 (GLOVE) ×2 IMPLANT
GLOVE SURG POLYISO LF SZ8 (GLOVE) ×4 IMPLANT
GLOVE SURG UNDER POLY LF SZ7 (GLOVE) ×2 IMPLANT
GOWN STRL REUS W/ TWL LRG LVL3 (GOWN DISPOSABLE) ×1 IMPLANT
GOWN STRL REUS W/ TWL XL LVL3 (GOWN DISPOSABLE) ×1 IMPLANT
GOWN STRL REUS W/TWL LRG LVL3 (GOWN DISPOSABLE) ×1
GOWN STRL REUS W/TWL XL LVL3 (GOWN DISPOSABLE) ×1
IV CATH 14GX2 1/4 (CATHETERS) ×2 IMPLANT
KIT BASIN OR (CUSTOM PROCEDURE TRAY) ×2 IMPLANT
NEEDLE 22X1 1/2 (OR ONLY) (NEEDLE) ×2 IMPLANT
NEEDLE SPNL 18GX3.5 QUINCKE PK (NEEDLE) ×6 IMPLANT
OIL CARTRIDGE MAESTRO DRILL (MISCELLANEOUS) ×2
PACK LAMINECTOMY NEURO (CUSTOM PROCEDURE TRAY) ×2 IMPLANT
PATTIES SURGICAL .75X.75 (GAUZE/BANDAGES/DRESSINGS) IMPLANT
SPONGE SURGIFOAM ABS GEL 100 (HEMOSTASIS) ×2 IMPLANT
SPONGE T-LAP 4X18 ~~LOC~~+RFID (SPONGE) ×4 IMPLANT
STAPLER VISISTAT (STAPLE) ×2 IMPLANT
STRIP CLOSURE SKIN 1/2X4 (GAUZE/BANDAGES/DRESSINGS) ×2 IMPLANT
SUT NURALON 4 0 TR CR/8 (SUTURE) IMPLANT
SUT PROLENE 3 0 PS 2 (SUTURE) IMPLANT
SUT VIC AB 1 CT1 27 (SUTURE)
SUT VIC AB 1 CT1 27XBRD ANTBC (SUTURE) IMPLANT
SUT VIC AB 1-0 CT2 27 (SUTURE) IMPLANT
SUT VIC AB 2-0 CT1 27 (SUTURE)
SUT VIC AB 2-0 CT1 TAPERPNT 27 (SUTURE) IMPLANT
SUT VIC AB 2-0 CT2 27 (SUTURE) IMPLANT
SYR 3ML LL SCALE MARK (SYRINGE) ×2 IMPLANT
TOWEL GREEN STERILE (TOWEL DISPOSABLE) ×2 IMPLANT
TOWEL GREEN STERILE FF (TOWEL DISPOSABLE) ×2 IMPLANT
TRAY FOLEY MTR SLVR 16FR STAT (SET/KITS/TRAYS/PACK) ×2 IMPLANT
WIPE CHG CHLORHEXIDINE 2% (PERSONAL CARE ITEMS) ×2 IMPLANT
YANKAUER SUCT BULB TIP NO VENT (SUCTIONS) ×2 IMPLANT

## 2020-12-22 NOTE — Anesthesia Procedure Notes (Signed)
Procedure Name: Intubation Date/Time: 12/22/2020 7:39 AM Performed by: Genelle Bal, CRNA Pre-anesthesia Checklist: Patient identified, Emergency Drugs available, Suction available and Patient being monitored Patient Re-evaluated:Patient Re-evaluated prior to induction Oxygen Delivery Method: Circle system utilized Preoxygenation: Pre-oxygenation with 100% oxygen Induction Type: IV induction Ventilation: Mask ventilation without difficulty Laryngoscope Size: Miller and 2 Grade View: Grade II Tube type: Oral Tube size: 7.0 mm Number of attempts: 1 Airway Equipment and Method: Stylet and Oral airway Placement Confirmation: ETT inserted through vocal cords under direct vision, positive ETCO2 and breath sounds checked- equal and bilateral Secured at: 21 cm Tube secured with: Tape Dental Injury: Teeth and Oropharynx as per pre-operative assessment

## 2020-12-22 NOTE — Interval H&P Note (Signed)
History and Physical Interval Note:  12/22/2020 7:26 AM  [name]  has presented today for surgery, with the diagnosis of Stenosis L4-5.  The various methods of treatment have been discussed with the patient and family. After consideration of risks, benefits and other options for treatment, the patient has consented to  Procedure(s): Microlumbar decompression L4-5 (N/A) as a surgical intervention.  The patient's history has been reviewed, patient examined, no change in status, stable for surgery.  I have reviewed the patient's chart and labs.  Questions were answered to the patient's satisfaction.     [name]

## 2020-12-22 NOTE — Transfer of Care (Signed)
Immediate Anesthesia Transfer of Care Note  Patient: Debrah Agostino  Procedure(s) Performed: Microlumbar decompression Lumbar four-five (Back)  Patient Location: PACU  Anesthesia Type:General  Level of Consciousness: awake, alert  and oriented  Airway & Oxygen Therapy: Patient Spontanous Breathing and Patient connected to face mask oxygen  Post-op Assessment: Report given to RN and Post -op Vital signs reviewed and stable  Post vital signs: Reviewed and stable  Last Vitals:  Vitals Value Taken Time  BP 139/68 12/22/20 1003  Temp    Pulse 73 12/22/20 1004  Resp 11 12/22/20 1004  SpO2 100 % 12/22/20 1004  Vitals shown include unvalidated device data.  Last Pain:  Vitals:   12/22/20 0637  TempSrc:   PainSc: 0-No pain         Complications: No notable events documented.

## 2020-12-22 NOTE — Discharge Instructions (Signed)

## 2020-12-22 NOTE — Anesthesia Postprocedure Evaluation (Signed)
Anesthesia Post Note  Patient: [name]  Procedure(s) Performed: Microlumbar decompression Lumbar four-five (Back)     Patient location during evaluation: PACU Anesthesia Type: General Level of consciousness: sedated Pain management: pain level controlled Vital Signs Assessment: post-procedure vital signs reviewed and stable Respiratory status: spontaneous breathing and respiratory function stable Cardiovascular status: stable Postop Assessment: no apparent nausea or vomiting Anesthetic complications: no   No notable events documented.  Last Vitals:  Vitals:   12/22/20 1048 12/22/20 1103  BP: 127/72 114/70  Pulse: 72 73  Resp: 12 11  Temp:    SpO2: 93% 92%    Last Pain:  Vitals:   12/22/20 1103  TempSrc:   PainSc: Asleep    LLE Motor Response: Purposeful movement;Responds to commands (12/22/20 1103) LLE Sensation: Full sensation (12/22/20 1103) RLE Motor Response: Purposeful movement;Responds to commands (12/22/20 1103) RLE Sensation: Full sensation (12/22/20 1103)      Daivon Rayos DANIEL

## 2020-12-22 NOTE — Anesthesia Preprocedure Evaluation (Signed)
Anesthesia Evaluation  Patient identified by MRN, date of birth, ID band Patient awake    Reviewed: Allergy & Precautions, NPO status , Patient's Chart, lab work & pertinent test results  History of Anesthesia Complications Negative for: history of anesthetic complications  Airway Mallampati: II  TM Distance: >3 FB Neck ROM: Full    Dental no notable dental hx. (+) Dental Advisory Given   Pulmonary former smoker,  Tonette Lederer, MD is pulmonologist ([place]). 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 One year follow-up to continue monitoring for new/worsening symptoms or worsening PFTs.   Pulmonary exam normal        Cardiovascular hypertension, + CAD, + Past MI and + Cardiac Stents  Normal cardiovascular exam  Nuclear stress test 06/15/20 ([facility]): FINDINGS:  Regional wall motion: reveals normal myocardial thickening and wall  motion.  The overall quality of the study is good.   Left ventricular cavity: normal.  Perfusion Analysis: SPECT images demonstrate homogeneous tracer  distribution throughout the myocardium. Defect type: Normal IMPRESSION: Negative ETT with fair exercise tolerance. LV function normal. LVEF 62%. No evidence of a reversible ischemia. Low risk study.   Cardiac cath 07/03/16: ? Mid RCA to Dist RCA lesion, 0 %stenosed. ? Dist LAD lesion, 0 %stenosed. ? The left ventricular systolic function is normal. ? LV end diastolic pressure is normal. No evidence of progression of disease. Lad stent patent, RCA stent patent. No evidence of prgression of disease in lcx , rca or lad.  EF normal.   Echo 01/03/16 ([facility]): Summary  Normal left ventricular wall thickness, cavity size, and systolic  function.  Normal regional wall motion  Left ventricular ejection fraction is  55 - 60%.  Normal left ventricular diastolic filling pattern.  Normal right ventricular size and function.  The left atrium is normal in size.  No significant valve abnormalities.     Neuro/Psych PSYCHIATRIC DISORDERS Anxiety    GI/Hepatic negative GI ROS, Neg liver ROS,   Endo/Other  negative endocrine ROSSLE  Renal/GU negative Renal ROS  negative genitourinary   Musculoskeletal negative musculoskeletal ROS (+)   Abdominal   Peds negative pediatric ROS (+)  Hematology negative hematology ROS (+)   Anesthesia Other Findings   Reproductive/Obstetrics negative OB ROS                             Anesthesia Physical Anesthesia Plan  ASA: 3  Anesthesia Plan: General   Post-op Pain Management:    Induction: Intravenous  PONV Risk Score and Plan: 4 or greater and Ondansetron, Dexamethasone, Diphenhydramine and Midazolam  Airway Management Planned: Oral ETT  Additional Equipment:   Intra-op Plan:   Post-operative Plan: Extubation in OR  Informed Consent: I have reviewed the patients History and Physical, chart, labs and discussed the procedure including the risks, benefits and alternatives for the proposed anesthesia with the patient or authorized representative who has indicated his/her understanding and acceptance.     Dental advisory given  Plan Discussed with: CRNA and Anesthesiologist  Anesthesia Plan Comments:         Anesthesia Quick Evaluation

## 2020-12-22 NOTE — Brief Op Note (Addendum)
12/22/2020  10:03 AM  PATIENT:  [name]  70 y.o. female  PRE-OPERATIVE DIAGNOSIS:  Stenosis Lumbar four-five  POST-OPERATIVE DIAGNOSIS:  Stenosis Lumbar four-five  PROCEDURE:  Procedure(s): Microlumbar decompression Lumbar four-five (N/A)  SURGEON:  Surgeon(s) and Role:    Susa Day, MD - Primary  PHYSICIAN ASSISTANT:   ASSISTANTS: [place]   ANESTHESIA:   general  EBL:  150 mL   BLOOD ADMINISTERED:none  DRAINS: none   LOCAL MEDICATIONS USED:  MARCAINE     SPECIMEN:  No Specimen  DISPOSITION OF SPECIMEN:  N/A  COUNTS:  YES  TOURNIQUET:  * No tourniquets in log *  DICTATION: .Other Dictation: Dictation Number [id number]  PLAN OF CARE: Admit for overnight observation  PATIENT DISPOSITION:  PACU - hemodynamically stable.   Delay start of Pharmacological VTE agent (>24hrs) due to surgical blood loss or risk of bleeding: yes

## 2020-12-22 NOTE — Op Note (Signed)
[name], [name] MEDICAL RECORD NO: [id number] ACCOUNT NO: [id number] DATE OF BIRTH: 04-01-1950 FACILITY: [facility] LOCATION: [facility] PHYSICIAN: [name], MD  Operative Report   DATE OF PROCEDURE: 12/22/2020  PREOPERATIVE DIAGNOSIS:  Spinal stenosis, L4-L5.  POSTOPERATIVE DIAGNOSIS:  Spinal stenosis, L4-L5.  PROCEDURE PERFORMED: 1.  Microlumbar decompression, L4-L5. 2.  Bilateral foraminotomies, L4. 3.  Bilateral foraminotomies, L5.  ANESTHESIA:  General.  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  She was indicated for decompression at 4-5.  Risks and benefits discussed including bleeding, infection, damage to neurovascular structures, no change in  symptoms, worsening symptoms, DVT, PE, anesthetic complications, recurrent stenosis, etc.  DESCRIPTION OF PROCEDURE:  The patient in supine position.  After induction of adequate general anesthesia, vancomycin and gentamicin for antimicrobial prophylaxis she was placed prone on the Wilson frame.  All bony prominences were well padded.  Foley  to gravity.  Lumbar region was prepped and draped in the usual sterile fashion.  Two 18-gauge spinal needle was utilized to localize the 4-5 interspace, confirmed with x-ray.  An incision was made from the spinous process of above 4 to below 5.   Subcutaneous tissue was dissected. Electrocautery was utilized to achieve hemostasis.  0.25% Marcaine with epinephrine was infiltrated in subcutaneous tissue.  Dorsal lumbar fascia divided underlying skin incision.  Paraspinous muscles elevated from  lamina of 4 and 5.  Bipolar cautery was utilized to achieve hemostasis.  McCulloch retractor was placed.  Kochers on the spinous process of 4 and 5 confirmatory radiograph obtained.  Operating microscope was draped and brought into the surgical field.   Leksell rongeur was utilized to remove the spinous processes of L4  and L5.  Next, a micro curette was utilized to detach the ligamentum flavum from the cephalad edge of 5.  Next, hypertrophic ligamentum was removed partially from the interspace with a  pituitary.  Next, a Leksell rongeur was utilized to perform hemi-laminotomies of the caudad edge of 4.  I then used a 2 mm Kerrison centrally cephalad to the point detaching the ligamentum flavum.  I then used a [place] and [place] to develop a plane  between the ligamentum flavum and the thecal sac and the minimal epidural fat.  I then began removing ligamentum from the interspace on the right and the left sequentially and cephalad.  I then used a Woodson retractor and then continued to decompress  the lateral recesses where there was severe compression of the L5 nerve roots.  I performed a hemilaminotomy of 5 of the cephalad edge and a foraminotomy of L5 after mobilizing the 5 root medially on each side.  There was severe compression of the 5  roots bilaterally.  I gently mobilized the nerve roots.  I was able to decompress the lateral recess to the medial border of the pedicle bilaterally.  Severe compression of the lamina and the superior lamina of L5.  Had into the foramen bilaterally.   Mild bulging discs bilaterally.  No diskectomy was performed.  Foraminotomies of L4 were performed bilaterally as well.  Bone wax was placed on the cancellous surfaces.  Good restoration of the thecal sac following the decompression.  Seemed that the  compression of the 5 roots was greater on right over the left.  Edematous and erythematous.  No evidence of CSF leakage or active bleeding.  I copiously irrigated the operative field including the subcutaneous tissues.  We then evacuated that.  I  obtained a confirmatory radiograph of the woods and retractors in the foramen of 4 and 5.  I used bipolar cautery, utilized to achieve strict hemostasis.  I removed the McCulloch retractor very small patties.  A thrombin-soaked Gelfoam 2 x 2 mm  were  placed, one cephalad, 1 inferior to the central lamina of 4 and 2 separate ones out over the foramen of 5.  No evidence of CSF leakage or active bleeding.  Strict hemostasis was obtained in the paraspinous musculature.  I then reapproximated the dorsal  lumbar fascia with #1 Vicryl in interrupted figure-of-eight sutures, subcutaneous with 2-0 and skin with staples.  Wound was dressed sterilely.  She was placed supine on the hospital bed, extubated without difficulty and transported to the recovery room  in satisfactory condition.  The patient tolerated the procedure well.  No complications  Assistance of [place], [place] was used throughout the case for general intermittent neural retraction, patient positioning, closure.  Blood loss was 100 mL.   PUS D: 12/22/2020 10:13:34 am T: 12/22/2020 11:45:00 am  JOB: [id number]/ [id number]

## 2020-12-23 ENCOUNTER — Encounter (HOSPITAL_COMMUNITY): Payer: Self-pay | Admitting: Specialist

## 2020-12-23 DIAGNOSIS — M48062 Spinal stenosis, lumbar region with neurogenic claudication: Secondary | ICD-10-CM | POA: Diagnosis not present

## 2020-12-23 LAB — CBC
HCT: 38.9 % (ref 36.0–46.0)
Hemoglobin: 12.8 g/dL (ref 12.0–15.0)
MCH: 31.6 pg (ref 26.0–34.0)
MCHC: 32.9 g/dL (ref 30.0–36.0)
MCV: 96 fL (ref 80.0–100.0)
Platelets: 292 10*3/uL (ref 150–400)
RBC: 4.05 MIL/uL (ref 3.87–5.11)
RDW: 14.2 % (ref 11.5–15.5)
WBC: 16.8 10*3/uL — ABNORMAL HIGH (ref 4.0–10.5)
nRBC: 0 % (ref 0.0–0.2)

## 2020-12-23 LAB — BASIC METABOLIC PANEL
Anion gap: 9 (ref 5–15)
BUN: 15 mg/dL (ref 8–23)
CO2: 25 mmol/L (ref 22–32)
Calcium: 9 mg/dL (ref 8.9–10.3)
Chloride: 104 mmol/L (ref 98–111)
Creatinine, Ser: 1.07 mg/dL — ABNORMAL HIGH (ref 0.44–1.00)
GFR, Estimated: 56 mL/min — ABNORMAL LOW (ref >60)
Glucose, Bld: 131 mg/dL — ABNORMAL HIGH (ref 70–99)
Potassium: 4.2 mmol/L (ref 3.5–5.1)
Sodium: 138 mmol/L (ref 135–145)

## 2020-12-23 NOTE — Progress Notes (Addendum)
Subjective: 2 Days Post-Op Procedure(s) (LRB): Microlumbar decompression Lumbar four-five (N/A) Patient reports pain as mild.  Leg pain resolved. Still noting some neuropathy type symptoms in her feet. Voiding without difficulty. Norco working well.  Objective: Vital signs in last 24 hours:    Intake/Output from previous day: No intake/output data recorded. Intake/Output this shift: Total I/O In: 280 [P.O.:180; IV Piggyback:100] Out: 400 [Urine:400]  Recent Labs    12/23/20 0411  HGB 12.8   Recent Labs    12/23/20 0411  WBC 16.8*  RBC 4.05  HCT 38.9  PLT 292   Recent Labs    12/23/20 0411  NA 138  K 4.2  CL 104  CO2 25  BUN 15  CREATININE 1.07*  GLUCOSE 131*  CALCIUM 9.0   No results for input(s): LABPT, INR in the last 72 hours.  Neurologically intact ABD soft Neurovascular intact Sensation intact distally Intact pulses distally Dorsiflexion/Plantar flexion intact Incision: dressing C/D/I and no drainage No cellulitis present Compartment soft No sign of DVT   Assessment/Plan: 2 Days Post-Op Procedure(s) (LRB): Microlumbar decompression Lumbar four-five (N/A) Advance diet Up with therapy D/C IV fluids Discussed LSpine precautions dressing instructions and D/C instructions  D/C to home today   [name] 12/24/2020, 11:52 AM

## 2020-12-23 NOTE — Evaluation (Signed)
Occupational Therapy Evaluation Patient Details Name: [name] MRN: [id number] DOB: 10/15/1950 Today's Date: 12/23/2020   History of Present Illness 70 yo female s/p microlumbar decompression L 4-5 PMH anxiety, HTN myocardial infarction Raynaud disease previous smoker idopathic peripheral neuropahty   Clinical Impression  Patient evaluated by Occupational Therapy with no further acute OT needs identified. All education has been completed and the patient has no further questions. See below for any follow-up Occupational Therapy or equipment needs. OT to sign off. Thank you for referral. '     Recommendations for follow up therapy are one component of a multi-disciplinary discharge planning process, led by the attending physician.  Recommendations may be updated based on patient status, additional functional criteria and insurance authorization.   Follow Up Recommendations  No OT follow up    Assistance Recommended at Discharge    Functional Status Assessment  Patient has had a recent decline in their functional status and demonstrates the ability to make significant improvements in function in a reasonable and predictable amount of time.  Equipment Recommendations  None recommended by OT    Recommendations for Other Services       Precautions / Restrictions Precautions Precautions: Back Precaution Comments: back handout provided and reviewed for back precautions      Mobility Bed Mobility Overal bed mobility: Needs Assistance Bed Mobility: Rolling;Supine to Sit Rolling: Min assist   Supine to sit: Min guard     General bed mobility comments: educated on back rolling precautions with good return demo. pt needed cues to avoid reaching with L UE posteriorly causing trunk rotation. pt able to push up wiht R UE into static sitting.    Transfers Overall transfer level: Modified independent Equipment used: Rolling walker (2 wheels)               General transfer  comment: friend is letting her borrow a RW with a basket on the front      Balance Overall balance assessment: Mild deficits observed, not formally tested                                         ADL either performed or assessed with clinical judgement   ADL Overall ADL's : Modified independent                                       General ADL Comments: pt completed bed transfers, BSC transfers, sink level grooming, self feeding, chair transfe rand basic transfer with RW. Back handout provided and reviewed adls in detail. Pt educated on: cset an alarm at night for medication, avoid sitting for long periods of time, correct bed positioning for sleeping, correct sequence for bed mobility, avoiding lifting more than 5 pounds and never wash directly over incision. All education is complete and patient indicates understanding. Pt is able to figure 4 cross at this time so no DME required pt was educated on use of reacher to get items off the floor. Discussed methods to feed animals by sitting or use of a funnel with dry food.      Vision Baseline Vision/History: 1 Wears glasses Ability to See in Adequate Light: 0 Adequate       Perception     Praxis      Pertinent Vitals/Pain Pain Assessment: Faces Faces  Pain Scale: Hurts a little bit Pain Location: back Pain Descriptors / Indicators: Sore Pain Intervention(s): Monitored during session;Premedicated before session;Repositioned     Hand Dominance Right   Extremity/Trunk Assessment     Lower Extremity Assessment Lower Extremity Assessment: Defer to PT evaluation   Cervical / Trunk Assessment Cervical / Trunk Assessment: Back Surgery   Communication Communication Communication: No difficulties   Cognition Arousal/Alertness: Awake/alert Behavior During Therapy: WFL for tasks assessed/performed Overall Cognitive Status: Within Functional Limits for tasks assessed                                        General Comments       Exercises     Shoulder Instructions      Home Living Family/patient expects to be discharged to:: Private residence Living Arrangements: Alone Available Help at Discharge: Family;Available PRN/intermittently Type of Home: House Home Access: Stairs to enter Entrance Stairs-Number of Steps: 3 Entrance Stairs-Rails: None Home Layout: One level     Bathroom Shower/Tub: Occupational psychologist: Handicapped height     Home Equipment: Conservation officer, nature (2 wheels);Cane - single point   Additional Comments: 7 horses, 3 dogs and 2 cats- friend and son (A) upon d/c      Prior Functioning/Environment Prior Level of Function : Independent/Modified Independent                        OT Problem List:        OT Treatment/Interventions:      OT Goals(Current goals can be found in the care plan section) Acute Rehab OT Goals Patient Stated Goal: to be able to return to horses after all this OT Goal Formulation: With patient  OT Frequency:     Barriers to D/C:            Co-evaluation              AM-PAC OT "6 Clicks" Daily Activity     Outcome Measure Help from another person eating meals?: None Help from another person taking care of personal grooming?: None Help from another person toileting, which includes using toliet, bedpan, or urinal?: None Help from another person bathing (including washing, rinsing, drying)?: None Help from another person to put on and taking off regular upper body clothing?: None Help from another person to put on and taking off regular lower body clothing?: None 6 Click Score: 24   End of Session Equipment Utilized During Treatment: Rolling walker (2 wheels) Nurse Communication: Mobility status;Precautions  Activity Tolerance: Patient tolerated treatment well Patient left: in chair;with call bell/phone within reach  OT Visit Diagnosis: Unsteadiness on feet (R26.81)                 Time: 5643-3295 ([phone] to gain home infor) pt allowed time to finish breakfast OT Time Calculation (min): 16 min Charges:  OT General Charges $OT Visit: 1 Visit OT Evaluation $OT Eval Moderate Complexity: 1 Mod   Brynn, OTR/L  Acute Rehabilitation Services Pager: [phone] Office: [phone] .   Jeri Modena 12/23/2020, 9:39 AM

## 2020-12-23 NOTE — Evaluation (Signed)
Physical Therapy Evaluation and Discharge Patient Details Name: [name] MRN: [id number] DOB: 12/17/1950 Today's Date: 12/23/2020  History of Present Illness  Pt is a 70 y/o female s/p microlumbar decompression L4-5 on 12/22/2020. PMH significant for anxiety, HTN, MI, Raynaud disease, idopathic peripheral neuropathy.   Clinical Impression  Pt admitted with above diagnosis. At the time of PT eval, pt was able to demonstrate transfers and ambulation with gross modified independence. Progressed from utilizing RW for comfort to no AD by end of session. Pt was educated on precautions, positioning recommendations, appropriate activity progression, and car transfer. Pt currently with functional limitations due to the deficits listed below (see PT Problem List). Pt will benefit from skilled PT to increase their independence and safety with mobility to allow discharge to the venue listed below.         Recommendations for follow up therapy are one component of a multi-disciplinary discharge planning process, led by the attending physician.  Recommendations may be updated based on patient status, additional functional criteria and insurance authorization.  Follow Up Recommendations No PT follow up    Assistance Recommended at Discharge PRN  Functional Status Assessment Patient has had a recent decline in their functional status and demonstrates the ability to make significant improvements in function in a reasonable and predictable amount of time.  Equipment Recommendations  None recommended by PT    Recommendations for Other Services       Precautions / Restrictions Precautions Precautions: Back Precaution Booklet Issued: Yes (comment) Precaution Comments: Reviewed handout and pt was cued for precautions during functional mobility. Restrictions Weight Bearing Restrictions: No      Mobility  Bed Mobility Overal bed mobility: Needs Assistance Bed Mobility: Rolling;Supine to Sit Rolling:  Min assist   Supine to sit: Min guard     General bed mobility comments: Pt was received sitting up in the recliner. Verbally reviewed log roll technique.    Transfers Overall transfer level: Modified independent Equipment used: Rolling walker (2 wheels);None               General transfer comment: No assist required. Increased time due to stiffness but overall steady.    Ambulation/Gait Ambulation/Gait assistance: Modified independent (Device/Increase time) Gait Distance (Feet): 500 Feet Assistive device: Rolling walker (2 wheels);None Gait Pattern/deviations: Step-through pattern;Decreased stride length;Trunk flexed Gait velocity: Decreased Gait velocity interpretation: 1.31 - 2.62 ft/sec, indicative of limited community ambulator   General Gait Details: VC's for improved posture, closer walker proximity, and forward gaze. No assist required. Pt progressed to no AD by end of session.  Stairs Stairs:  (Pt declines stair training)          Wheelchair Mobility    Modified Rankin (Stroke Patients Only)       Balance Overall balance assessment: Mild deficits observed, not formally tested                                           Pertinent Vitals/Pain Pain Assessment: Faces Faces Pain Scale: Hurts a little bit Pain Location: back Pain Descriptors / Indicators: Sore Pain Intervention(s): Limited activity within patient's tolerance;Monitored during session;Repositioned    Home Living Family/patient expects to be discharged to:: Private residence Living Arrangements: Alone Available Help at Discharge: Family;Available PRN/intermittently Type of Home: House Home Access: Stairs to enter Entrance Stairs-Rails: None Entrance Stairs-Number of Steps: 3   Home Layout: One level  Home Equipment: Conservation officer, nature (2 wheels);Cane - single point;Shower seat;Hand held shower head Additional Comments: 7 horses, 3 dogs and 2 cats- friend Journalist, newspaper) and  son (A) upon d/c    Prior Function Prior Level of Function : Independent/Modified Independent                     Hand Dominance   Dominant Hand: Right    Extremity/Trunk Assessment   Upper Extremity Assessment Upper Extremity Assessment: Defer to OT evaluation    Lower Extremity Assessment Lower Extremity Assessment: Generalized weakness (Consistent with pre-op diagnosis)    Cervical / Trunk Assessment Cervical / Trunk Assessment: Back Surgery  Communication   Communication: No difficulties  Cognition Arousal/Alertness: Awake/alert Behavior During Therapy: WFL for tasks assessed/performed Overall Cognitive Status: Within Functional Limits for tasks assessed                                          General Comments      Exercises     Assessment/Plan    PT Assessment Patient does not need any further PT services  PT Problem List         PT Treatment Interventions      PT Goals (Current goals can be found in the Care Plan section)  Acute Rehab PT Goals Patient Stated Goal: Home today PT Goal Formulation: All assessment and education complete, DC therapy    Frequency     Barriers to discharge        Co-evaluation               AM-PAC PT "6 Clicks" Mobility  Outcome Measure Help needed turning from your back to your side while in a flat bed without using bedrails?: None Help needed moving from lying on your back to sitting on the side of a flat bed without using bedrails?: None Help needed moving to and from a bed to a chair (including a wheelchair)?: None Help needed standing up from a chair using your arms (e.g., wheelchair or bedside chair)?: None Help needed to walk in hospital room?: None Help needed climbing 3-5 steps with a railing? : A Little 6 Click Score: 23    End of Session Equipment Utilized During Treatment: Gait belt Activity Tolerance: Patient tolerated treatment well Patient left: in chair;with call  bell/phone within reach Nurse Communication: Mobility status PT Visit Diagnosis: Unsteadiness on feet (R26.81);Pain Pain - part of body:  (back)    Time: 8127-5170 PT Time Calculation (min) (ACUTE ONLY): 12 min   Charges:   PT Evaluation $PT Eval Low Complexity: 1 Low          Rolinda Roan, PT, DPT Acute Rehabilitation Services Pager: [phone] Office: [phone]   Thelma Comp 12/23/2020, 10:03 AM

## 2020-12-23 NOTE — Progress Notes (Signed)
Patient alert and oriented, mae's well, voiding adequate amount of urine, swallowing without difficulty, no c/o pain at time of discharge. Patient discharged home with family. Script and discharged instructions given to patient. Patient and family stated understanding of instructions given. Patient has an appointment with Dr. Beane in 2 weeks 

## 2021-06-21 ENCOUNTER — Other Ambulatory Visit: Payer: Self-pay | Admitting: Family Medicine

## 2021-06-21 DIAGNOSIS — Z1231 Encounter for screening mammogram for malignant neoplasm of breast: Secondary | ICD-10-CM

## 2021-06-28 ENCOUNTER — Ambulatory Visit
Admission: RE | Admit: 2021-06-28 | Discharge: 2021-06-28 | Disposition: A | Discharge status: Home / Self Care | Payer: Medicare PPO | Source: Ambulatory Visit | Attending: Family Medicine | Admitting: Family Medicine

## 2021-06-28 DIAGNOSIS — Z1231 Encounter for screening mammogram for malignant neoplasm of breast: Secondary | ICD-10-CM | POA: Diagnosis not present

## 2021-07-03 ENCOUNTER — Other Ambulatory Visit: Payer: Self-pay | Admitting: Family Medicine

## 2021-07-03 DIAGNOSIS — R928 Other abnormal and inconclusive findings on diagnostic imaging of breast: Secondary | ICD-10-CM

## 2021-07-03 DIAGNOSIS — R921 Mammographic calcification found on diagnostic imaging of breast: Secondary | ICD-10-CM

## 2021-07-13 ENCOUNTER — Ambulatory Visit
Admission: RE | Admit: 2021-07-13 | Discharge: 2021-07-13 | Disposition: A | Discharge status: Home / Self Care | Payer: Medicare PPO | Source: Ambulatory Visit | Attending: Family Medicine | Admitting: Family Medicine

## 2021-07-13 DIAGNOSIS — R921 Mammographic calcification found on diagnostic imaging of breast: Secondary | ICD-10-CM | POA: Insufficient documentation

## 2021-07-13 DIAGNOSIS — R928 Other abnormal and inconclusive findings on diagnostic imaging of breast: Secondary | ICD-10-CM | POA: Insufficient documentation

## 2022-06-14 IMAGING — MG MM DIGITAL DIAGNOSTIC UNILAT*R* W/ TOMO W/ CAD
8 series · 8 of 20 positions shown · non-contrast
Comparison: Previous exam(s).

CLINICAL DATA: Callback for possible RIGHT breast asymmetry and
calcifications

EXAM:
DIGITAL DIAGNOSTIC UNILATERAL RIGHT MAMMOGRAM WITH TOMOSYNTHESIS AND
CAD
TECHNIQUE: Right digital diagnostic mammography and breast tomosynthesis was
performed. The images were evaluated with computer-aided detection.

[R ML]
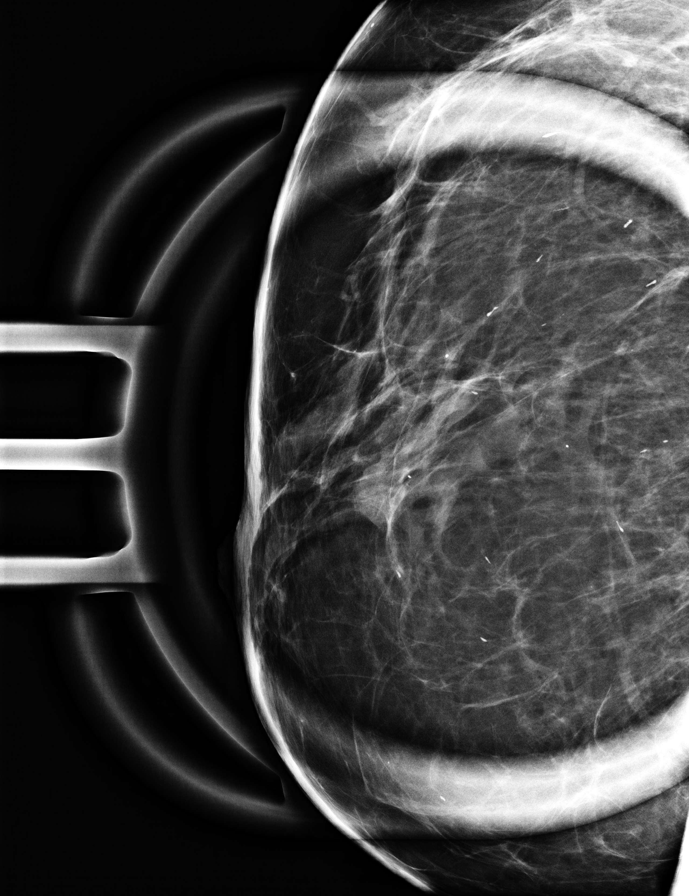

[R CC]
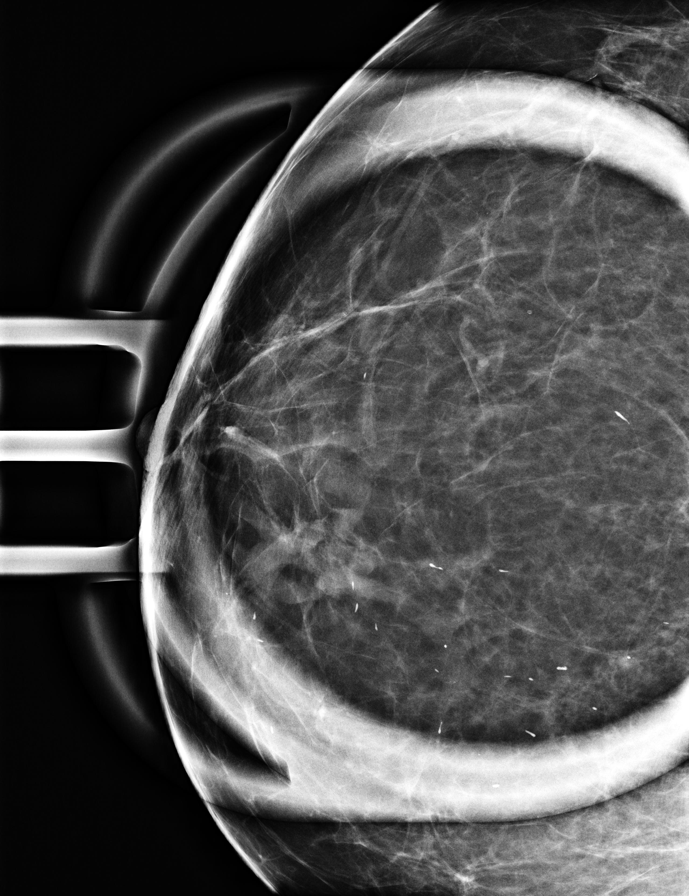

[R ML synth-2D]
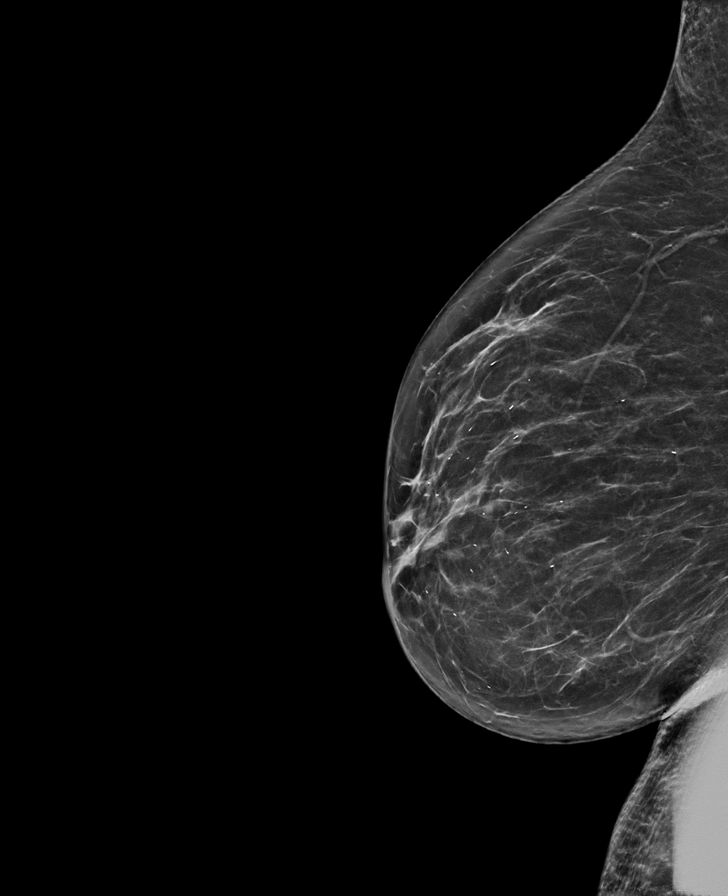

[R CC synth-2D]
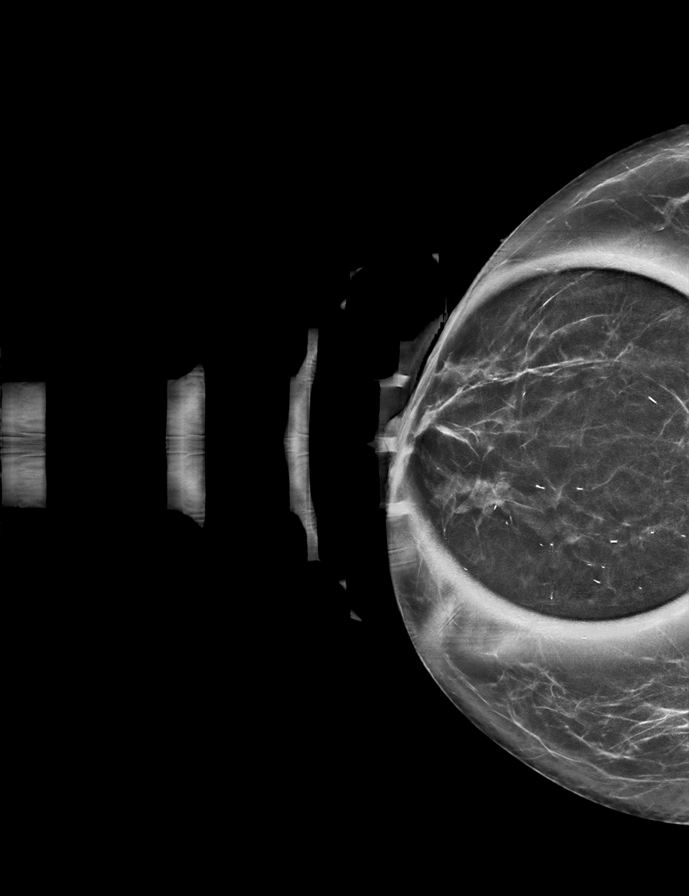

[R MLO synth-2D]
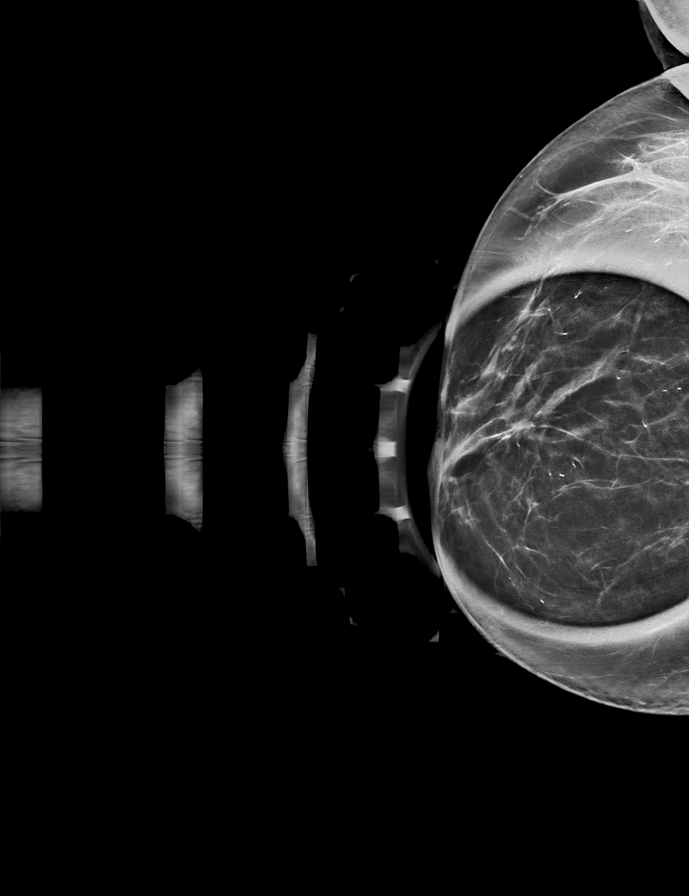

[R CC tomo · tomo slice 31/62.0]
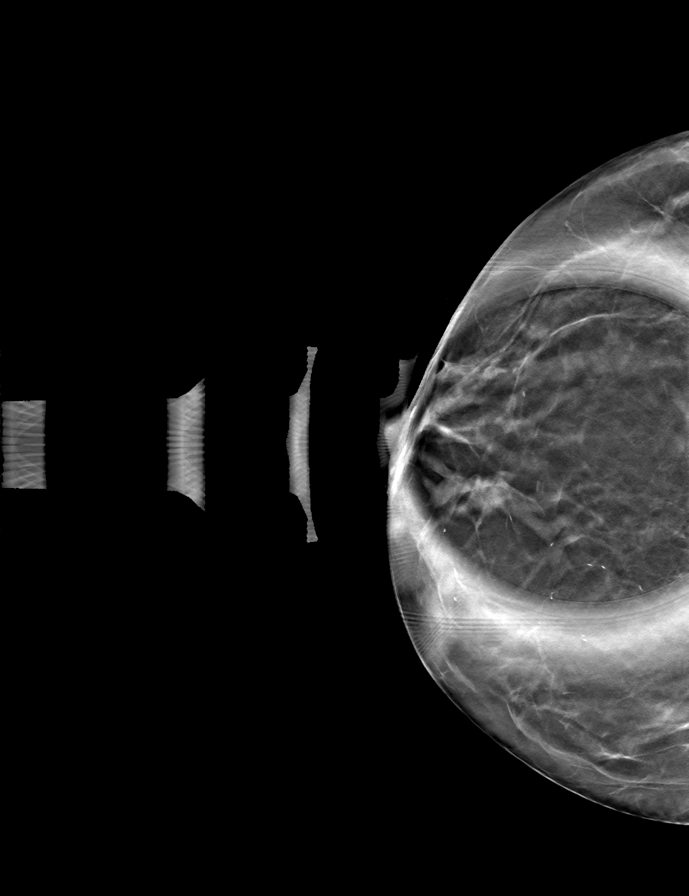

[R MLO tomo · tomo slice 33/65.0]
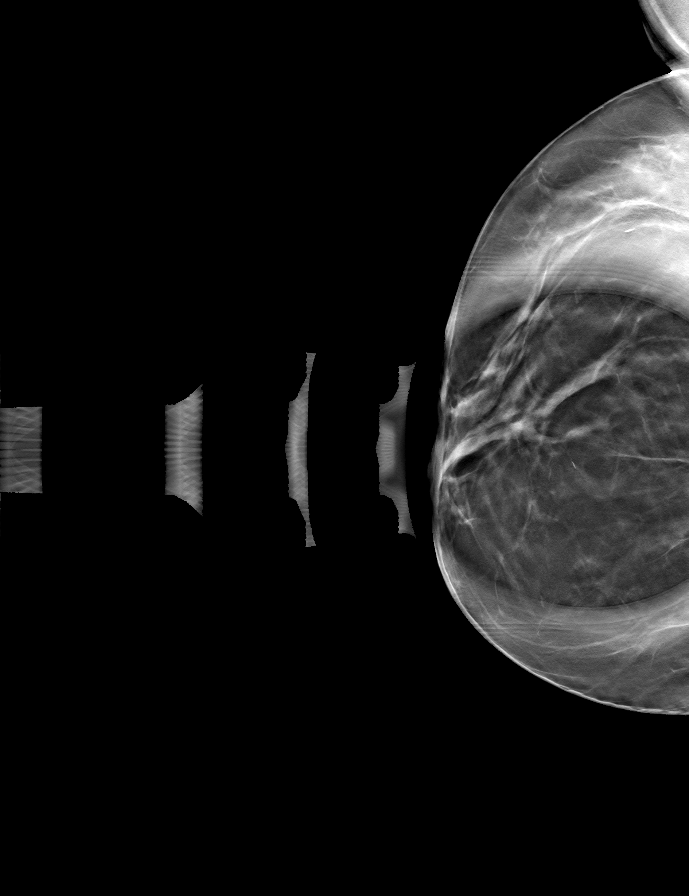

[R ML tomo · tomo slice 37/72.0]
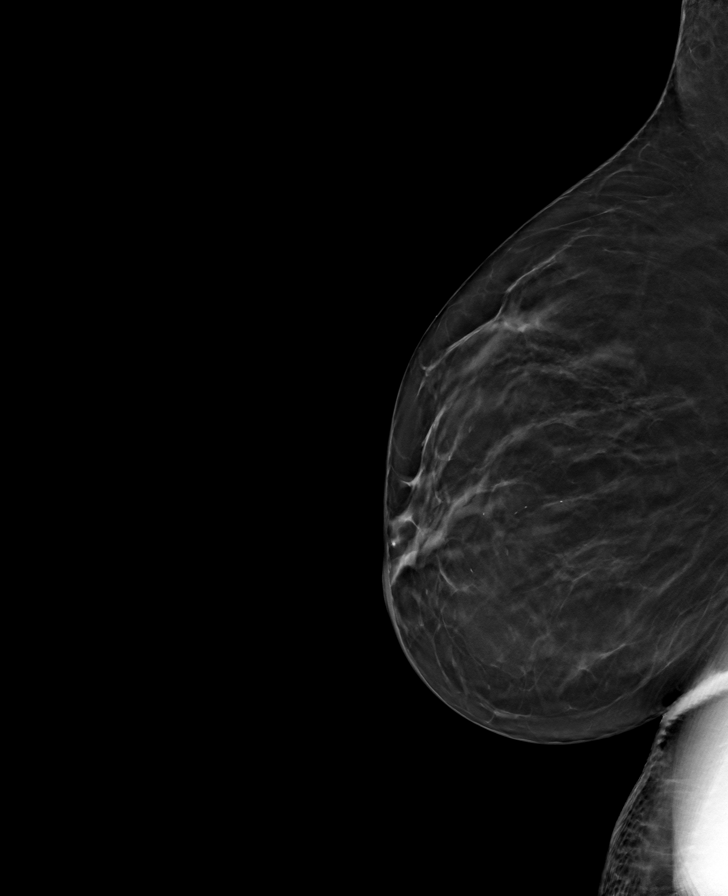

[8 of 20 positions shown; findings below may reference images not displayed]

ACR Breast Density Category b: There are scattered areas of
fibroglandular density.
FINDINGS: The previously described finding does not persist with additional
views, consistent with superimposed fibroglandular tissue. Spot
magnification views demonstrate that the previously questioned
calcifications are consistent with benign tomosynthesis associated
pseudocalcification artifact. No suspicious mass,
microcalcification, or other finding is identified.
IMPRESSION: No mammographic evidence of malignancy.

RECOMMENDATION:
Screening mammogram in one year.(Code:[id number])

I have discussed the findings and recommendations with the patient.
If applicable, a reminder letter will be sent to the patient
regarding the next appointment.

BI-RADS CATEGORY  2: Benign.

## 2022-12-03 ENCOUNTER — Encounter: Payer: Self-pay | Admitting: Podiatry

## 2022-12-03 ENCOUNTER — Other Ambulatory Visit: Payer: Self-pay | Admitting: Podiatry

## 2022-12-03 DIAGNOSIS — S92354D Nondisplaced fracture of fifth metatarsal bone, right foot, subsequent encounter for fracture with routine healing: Secondary | ICD-10-CM

## 2022-12-04 ENCOUNTER — Encounter: Payer: Self-pay | Admitting: Podiatry

## 2022-12-19 ENCOUNTER — Ambulatory Visit
Admission: RE | Admit: 2022-12-19 | Discharge: 2022-12-19 | Disposition: A | Discharge status: Home / Self Care | Payer: Medicare PPO | Source: Ambulatory Visit | Attending: Podiatry | Admitting: Podiatry

## 2022-12-19 DIAGNOSIS — S92354D Nondisplaced fracture of fifth metatarsal bone, right foot, subsequent encounter for fracture with routine healing: Secondary | ICD-10-CM

## 2023-01-14 ENCOUNTER — Encounter
Admission: RE | Disposition: A | Discharge status: Home / Self Care | Payer: Self-pay | Source: Home / Self Care | Attending: Gastroenterology

## 2023-01-14 ENCOUNTER — Ambulatory Visit: Payer: Medicare PPO

## 2023-01-14 ENCOUNTER — Other Ambulatory Visit: Payer: Self-pay

## 2023-01-14 ENCOUNTER — Ambulatory Visit
Admission: RE | Admit: 2023-01-14 | Discharge: 2023-01-14 | Disposition: A | Discharge status: Home / Self Care | Payer: Medicare PPO | Attending: Gastroenterology | Admitting: Gastroenterology

## 2023-01-14 ENCOUNTER — Encounter: Payer: Self-pay | Admitting: *Deleted

## 2023-01-14 DIAGNOSIS — K640 First degree hemorrhoids: Secondary | ICD-10-CM | POA: Insufficient documentation

## 2023-01-14 DIAGNOSIS — K5730 Diverticulosis of large intestine without perforation or abscess without bleeding: Secondary | ICD-10-CM | POA: Diagnosis not present

## 2023-01-14 DIAGNOSIS — D123 Benign neoplasm of transverse colon: Secondary | ICD-10-CM | POA: Diagnosis not present

## 2023-01-14 DIAGNOSIS — Z860101 Personal history of adenomatous and serrated colon polyps: Secondary | ICD-10-CM | POA: Diagnosis not present

## 2023-01-14 DIAGNOSIS — Z87891 Personal history of nicotine dependence: Secondary | ICD-10-CM | POA: Diagnosis not present

## 2023-01-14 DIAGNOSIS — F419 Anxiety disorder, unspecified: Secondary | ICD-10-CM | POA: Diagnosis not present

## 2023-01-14 DIAGNOSIS — I2510 Atherosclerotic heart disease of native coronary artery without angina pectoris: Secondary | ICD-10-CM | POA: Diagnosis not present

## 2023-01-14 DIAGNOSIS — I10 Essential (primary) hypertension: Secondary | ICD-10-CM | POA: Insufficient documentation

## 2023-01-14 DIAGNOSIS — Z1211 Encounter for screening for malignant neoplasm of colon: Secondary | ICD-10-CM | POA: Diagnosis present

## 2023-01-14 DIAGNOSIS — Z79899 Other long term (current) drug therapy: Secondary | ICD-10-CM | POA: Insufficient documentation

## 2023-01-14 DIAGNOSIS — Z7982 Long term (current) use of aspirin: Secondary | ICD-10-CM | POA: Insufficient documentation

## 2023-01-14 DIAGNOSIS — I7300 Raynaud's syndrome without gangrene: Secondary | ICD-10-CM | POA: Diagnosis not present

## 2023-01-14 HISTORY — PX: COLONOSCOPY WITH PROPOFOL: SHX5780

## 2023-01-14 HISTORY — PX: POLYPECTOMY: SHX5525

## 2023-01-14 SURGERY — COLONOSCOPY WITH PROPOFOL
Anesthesia: General

## 2023-01-14 MED ORDER — SODIUM CHLORIDE 0.9 % IV SOLN: INTRAVENOUS | Status: DC

## 2023-01-14 MED ORDER — PROPOFOL 500 MG/50ML IV EMUL
INTRAVENOUS | Status: DC | PRN
  Administered 2023-01-14: 70 mg via INTRAVENOUS
  Administered 2023-01-14: 150 ug/kg/min via INTRAVENOUS

## 2023-01-14 NOTE — Op Note (Signed)
[facility]astroenter[facility] Patient Name: [name] Procedure Date: 01/14/2023 11:13 AM MRN: [id number] Account #: [id number] Date of Birth: 12-08-1950 Admit Type: Outpatient Age: 72 Room: [facility] ENDO ROOM 3 Gender: Female Note Status: Finalized Instrument Name: Prentice Docker [id number] Procedure:             Colonoscopy Indications:           Surveillance: Personal history of adenomatous polyps                         on last colonoscopy 5 years ago Providers:             Eather Colas MD, MD Referring MD:          Rhona Leavens. Burnett Sheng, MD (Referring MD) Medicines:             Monitored Anesthesia Care Complications:         No immediate complications. Estimated blood loss:                         Minimal. Procedure:             Pre-Anesthesia Assessment:                        - Prior to the procedure, a History and Physical was                         performed, and patient medications and allergies were                         reviewed. The patient is competent. The risks and                         benefits of the procedure and the sedation options and                         risks were discussed with the patient. All questions                         were answered and informed consent was obtained.                         Patient identification and proposed procedure were                         verified by the physician, the nurse, the                         anesthesiologist, the anesthetist and the technician                         in the endoscopy suite. Mental Status Examination:                         alert and oriented. Airway Examination: normal                         oropharyngeal airway and neck mobility. Respiratory  Examination: clear to auscultation. CV Examination:                         normal. Prophylactic Antibiotics: The patient does not                         require prophylactic antibiotics. Prior                          Anticoagulants: The patient has taken no anticoagulant                         or antiplatelet agents except for aspirin. ASA Grade                         Assessment: III - A patient with severe systemic                         disease. After reviewing the risks and benefits, the                         patient was deemed in satisfactory condition to                         undergo the procedure. The anesthesia plan was to use                         monitored anesthesia care (MAC). Immediately prior to                         administration of medications, the patient was                         re-assessed for adequacy to receive sedatives. The                         heart rate, respiratory rate, oxygen saturations,                         blood pressure, adequacy of pulmonary ventilation, and                         response to care were monitored throughout the                         procedure. The physical status of the patient was                         re-assessed after the procedure.                        After obtaining informed consent, the colonoscope was                         passed under direct vision. Throughout the procedure,                         the patient's blood pressure, pulse, and oxygen  saturations were monitored continuously. The                         Colonoscope was introduced through the anus and                         advanced to the the terminal ileum. The colonoscopy                         was performed without difficulty. The patient                         tolerated the procedure well. The quality of the bowel                         preparation was good. The terminal ileum, ileocecal                         valve, appendiceal orifice, and rectum were                         photographed. Findings:      The perianal and digital rectal examinations were normal.      The terminal ileum appeared normal.      A 1 mm  polyp was found in the transverse colon. The polyp was sessile.       The polyp was removed with a jumbo cold forceps. Resection and retrieval       were complete. Estimated blood loss was minimal.      Multiple small-mouthed diverticula were found in the sigmoid colon.      Internal hemorrhoids were found during retroflexion. The hemorrhoids       were Grade I (internal hemorrhoids that do not prolapse).      The exam was otherwise without abnormality on direct and retroflexion       views. Impression:            - The examined portion of the ileum was normal.                        - One 1 mm polyp in the transverse colon, removed with                         a jumbo cold forceps. Resected and retrieved.                        - Diverticulosis in the sigmoid colon.                        - Internal hemorrhoids.                        - The examination was otherwise normal on direct and                         retroflexion views. Recommendation:        - Discharge patient to home.                        - Resume previous diet.                        -  Continue present medications.                        - Await pathology results.                        - Repeat colonoscopy is not recommended due to current                         age (14 years or older) for surveillance.                        - Return to referring physician as previously                         scheduled. Procedure Code(s):     --- Professional ---                        [id number], Colonoscopy, flexible; with biopsy, single or                         multiple Diagnosis Code(s):     --- Professional ---                        Z86.010, Personal history of colonic polyps                        K64.0, First degree hemorrhoids                        D12.3, Benign neoplasm of transverse colon (hepatic                         flexure or splenic flexure)                        K57.30, Diverticulosis of large intestine without                          perforation or abscess without bleeding CPT copyright 2022 [employer]. All rights reserved. The codes documented in this report are preliminary and upon coder review may  be revised to meet current compliance requirements. Eather Colas MD, MD 01/14/2023 11:49:53 AM Number of Addenda: 0 Note Initiated On: 01/14/2023 11:13 AM Scope Withdrawal Time: 0 hours 8 minutes 18 seconds  Total Procedure Duration: 0 hours 16 minutes 37 seconds  Estimated Blood Loss:  Estimated blood loss was minimal.      [facility]

## 2023-01-14 NOTE — Anesthesia Preprocedure Evaluation (Signed)
Anesthesia Evaluation  Patient identified by MRN, date of birth, ID band Patient awake    Reviewed: Allergy & Precautions, NPO status , Patient's Chart, lab work & pertinent test results  History of Anesthesia Complications Negative for: history of anesthetic complications  Airway Mallampati: III  TM Distance: <3 FB Neck ROM: full    Dental  (+) Chipped   Pulmonary neg shortness of breath, former smoker   Pulmonary exam normal        Cardiovascular Exercise Tolerance: Good hypertension, (-) angina + CAD and + Past MI  Normal cardiovascular exam     Neuro/Psych   Anxiety     negative neurological ROS  negative psych ROS   GI/Hepatic negative GI ROS, Neg liver ROS,neg GERD  ,,  Endo/Other  negative endocrine ROS    Renal/GU negative Renal ROS  negative genitourinary   Musculoskeletal   Abdominal   Peds  Hematology negative hematology ROS (+)   Anesthesia Other Findings Past Medical History: No date: Anxiety No date: Connective tissue disease (HCC) No date: Coronary artery disease No date: Cystocele No date: Eczema No date: Hypertension No date: Myocardial infarction (HCC) No date: PMB (postmenopausal bleeding) No date: Raynaud disease No date: Rectocele No date: Recurrent UTI No date: SUI (stress urinary incontinence, female) No date: Unstable bladder No date: Urination frequency No date: Uterus descensus     Comment:  cervix descends to mid vagina No date: Vaginal atrophy No date: Vaginal itching  Past Surgical History: 01/19/2014: ABDOMINAL HYSTERECTOMY     Comment:  [place]/bso 2010, 06/2016: CARDIAC CATHETERIZATION 1986: CESAREAN SECTION 07/16/2017: COLONOSCOPY WITH PROPOFOL; N/A     Comment:  Procedure: COLONOSCOPY WITH PROPOFOL;  Surgeon:               Christena Deem, MD;  Location: [facility] ENDOSCOPY;                Service: Endoscopy;  Laterality: N/A; No date: CORONARY ANGIOPLASTY      Comment:  DES mLAD 10/16/11; DES RCA 12/2015 01/18/2014: cystotomy repair 2018: JOINT REPLACEMENT; Right 07/03/2016: LEFT HEART CATH AND CORONARY ANGIOGRAPHY; Left     Comment:  Procedure: Left Heart Cath and Coronary Angiography;                Surgeon: Dalia Heading, MD;  Location: [facility] INVASIVE CV              LAB;  Service: Cardiovascular;  Laterality: Left; 12/22/2020: LUMBAR LAMINECTOMY/DECOMPRESSION MICRODISCECTOMY; N/A     Comment:  Procedure: Microlumbar decompression Lumbar four-five;                Surgeon: Jene Every, MD;  Location: [facility] OR;  Service:               Orthopedics;  Laterality: N/A; 1969: TONSILLECTOMY 09/12/2016: TOTAL HIP ARTHROPLASTY; Right     Comment:  Procedure: RIGHT TOTAL HIP ARTHROPLASTY ANTERIOR               APPROACH;  Surgeon: Ollen Gross, MD;  Location: [facility]               ORS;  Service: Orthopedics;  Laterality: Right; 1986: TUBAL LIGATION  BMI    Body Mass Index: 29.05 kg/m      Reproductive/Obstetrics negative OB ROS                             Anesthesia Physical Anesthesia Plan  ASA:  3  Anesthesia Plan: General   Post-op Pain Management:    Induction: Intravenous  PONV Risk Score and Plan: Propofol infusion and TIVA  Airway Management Planned: Natural Airway and Nasal Cannula  Additional Equipment:   Intra-op Plan:   Post-operative Plan:   Informed Consent: I have reviewed the patients History and Physical, chart, labs and discussed the procedure including the risks, benefits and alternatives for the proposed anesthesia with the patient or authorized representative who has indicated his/her understanding and acceptance.     Dental Advisory Given  Plan Discussed with: Anesthesiologist, CRNA and Surgeon  Anesthesia Plan Comments: (Patient consented for risks of anesthesia including but not limited to:  - adverse reactions to medications - risk of airway placement if required - damage to eyes,  teeth, lips or other oral mucosa - nerve damage due to positioning  - sore throat or hoarseness - Damage to heart, brain, nerves, lungs, other parts of body or loss of life  Patient voiced understanding and assent.)       Anesthesia Quick Evaluation

## 2023-01-14 NOTE — H&P (Signed)
Outpatient short stay form Pre-procedure 01/14/2023  [name], MD  Primary Physician: Kristina Mina, MD  Reason for visit:  Surveillance  History of present illness:    72 y/o lady with history of hypertension, CAD on aspirin, and connective tissue disease here for surveillance colonoscopy. Last colonoscopy in 2019 with two small Ta's. No blood thinners except aspirin. History of hysterectomy.    Current Facility-Administered Medications:    0.9 %  sodium chloride infusion, , Intravenous, Continuous, [place] Mallozzi, Rossie Muskrat, MD  Medications Prior to Admission  Medication Sig Dispense Refill Last Dose   acetaminophen (TYLENOL) 500 MG tablet Take 1,000 mg by mouth every 6 (six) hours as needed for moderate pain.   01/13/2023   amLODipine (NORVASC) 2.5 MG tablet Take 2.5 mg by mouth daily.   01/13/2023   diazepam (VALIUM) 10 MG tablet Take 10 mg by mouth daily as needed for bladder spasms.   01/13/2023   diphenhydrAMINE (BENADRYL) 25 mg capsule Take 25 mg by mouth every 6 (six) hours as needed for allergies.   01/13/2023   docusate sodium (COLACE) 100 MG capsule Take 1 capsule (100 mg total) by mouth 2 (two) times daily as needed for mild constipation. 30 capsule 1 Past Week   estradiol (ESTRACE) 0.1 MG/GM vaginal cream Place 1 Applicatorful vaginally every 3 (three) days.   01/13/2023   fluticasone (FLONASE) 50 MCG/ACT nasal spray Place 2 sprays into both nostrils daily as needed for allergies or rhinitis.   01/13/2023   folic acid (FOLVITE) 1 MG tablet Take 1 mg by mouth 2 (two) times a week.   01/13/2023   gabapentin (NEURONTIN) 300 MG capsule Take 300 mg by mouth daily.   01/13/2023   Guaifenesin 1200 MG TB12 Take 1,200 mg by mouth daily as needed (congestion).   01/13/2023   HYDROcodone-acetaminophen (NORCO/VICODIN) 5-325 MG tablet Take 1 tablet by mouth every 4 (four) hours as needed for severe pain. 40 tablet 0 01/13/2023   L-Lysine 500 MG TABS Take 500 mg by mouth daily as needed.    01/13/2023   loratadine (CLARITIN) 10 MG tablet Take 10 mg by mouth daily.   01/13/2023   metoprolol tartrate (LOPRESSOR) 25 MG tablet Take 25 mg by mouth daily.   01/13/2023   mirabegron ER (MYRBETRIQ) 50 MG TB24 tablet Take 50 mg by mouth daily.   01/13/2023   Multiple Minerals (CALCIUM-MAGNESIUM-ZINC) TABS Take 1 tablet by mouth daily.   01/13/2023   omeprazole (PRILOSEC) 20 MG capsule Take 20 mg by mouth daily.   01/13/2023   polyethylene glycol (MIRALAX / GLYCOLAX) 17 g packet Take 17 g by mouth daily. 14 each 0 01/13/2023   pyridOXINE (VITAMIN B-6) 100 MG tablet Take 100 mg by mouth daily.   01/13/2023   venlafaxine XR (EFFEXOR-XR) 75 MG 24 hr capsule Take 75 mg by mouth daily.   01/13/2023   EPINEPHrine 0.3 mg/0.3 mL IJ SOAJ injection Inject 0.3 mg into the muscle as needed for anaphylaxis.      methocarbamol (ROBAXIN) 500 MG tablet Take 1 tablet (500 mg total) by mouth every 6 (six) hours as needed for muscle spasms. (Patient not taking: No sig reported) 80 tablet 0    traMADol (ULTRAM) 50 MG tablet Take 1-2 tablets (50-100 mg total) by mouth every 6 (six) hours as needed for moderate pain. (Patient not taking: No sig reported) 56 tablet 0      Allergies  Allergen Reactions   Ceftriaxone     Other reaction(s): Confusion/Altered  Mental Status "feels like I'm coming out of my skin"   Ciprofloxacin-Dexamethasone Palpitations   Sulfa Antibiotics Hives    Hypotension, chills  Other reaction(s): Photosensitivity, Unknown   Penicillins Hives   Vancomycin     Causes RED MANS SYNDROME; per Allergist; may be administered with these precautions: "pleas pre-treat patient with benadryl 50mg  prior to dosing. Run in the infusion at a slower rate . Run the infusion over at least 2 hours or longer."   Albuterol     Other reaction(s): Unknown    Cephalosporins Other (See Comments)    Other reaction(s): Unknown   Clarithromycin     Gi pain, jittery   Doxycycline Other (See Comments)    Jittery      Levofloxacin     Other reaction(s): Unknown   Motrin [Ibuprofen] Other (See Comments)    In high doses, unknown reaction   Naproxen Sodium     Other reaction(s): UNKNOWN   Oxycodone Hcl Nausea And Vomiting    Bad Dreams   Amoxicillin Rash    Has patient had a PCN reaction causing immediate rash, facial/tongue/throat swelling, SOB or lightheadedness with hypotension: Yes Has patient had a PCN reaction causing severe rash involving mucus membranes or skin necrosis: No Has patient had a PCN reaction that required hospitalization: No Has patient had a PCN reaction occurring within the last 10 years: No If all of the above answers are "NO", then may proceed with Cephalosporin use.    Cefdinir Rash   Cefuroxime Anxiety and Palpitations    Other reaction(s): HEADACHE   Cephalexin Nausea And Vomiting   Ciprofloxacin Anxiety and Palpitations        Clindamycin/Lincomycin Rash    Dizzy, light headed     Past Medical History:  Diagnosis Date   Anxiety    Connective tissue disease (HCC)    Coronary artery disease    Cystocele    Eczema    Hypertension    Myocardial infarction (HCC)    PMB (postmenopausal bleeding)    Raynaud disease    Rectocele    Recurrent UTI    SUI (stress urinary incontinence, female)    Unstable bladder    Urination frequency    Uterus descensus    cervix descends to mid vagina   Vaginal atrophy    Vaginal itching     Review of systems:  Otherwise negative.    Physical Exam  Gen: Alert, oriented. Appears stated age.  HEENT: PERRLA. Lungs: No respiratory distress CV: RRR Abd: soft, benign, no masses Ext: No edema    Planned procedures: Proceed with colonoscopy. The patient understands the nature of the planned procedure, indications, risks, alternatives and potential complications including but not limited to bleeding, infection, perforation, damage to internal organs and possible oversedation/side effects from anesthesia. The patient agrees  and gives consent to proceed.  Please refer to procedure notes for findings, recommendations and patient disposition/instructions.     [name], MD [facility]

## 2023-01-14 NOTE — Interval H&P Note (Signed)
History and Physical Interval Note:  01/14/2023 11:18 AM  [name]  has presented today for surgery, with the diagnosis of hx of adnomatous polyp of colon.  The various methods of treatment have been discussed with the patient and family. After consideration of risks, benefits and other options for treatment, the patient has consented to  Procedure(s): COLONOSCOPY WITH PROPOFOL (N/A) as a surgical intervention.  The patient's history has been reviewed, patient examined, no change in status, stable for surgery.  I have reviewed the patient's chart and labs.  Questions were answered to the patient's satisfaction.     [name]  Ok to proceed with colonoscopy

## 2023-01-14 NOTE — Anesthesia Postprocedure Evaluation (Signed)
Anesthesia Post Note  Patient: [name]  Procedure(s) Performed: COLONOSCOPY WITH PROPOFOL POLYPECTOMY  Patient location during evaluation: Endoscopy Anesthesia Type: General Level of consciousness: awake and alert Pain management: pain level controlled Vital Signs Assessment: post-procedure vital signs reviewed and stable Respiratory status: spontaneous breathing, nonlabored ventilation, respiratory function stable and patient connected to nasal cannula oxygen Cardiovascular status: blood pressure returned to baseline and stable Postop Assessment: no apparent nausea or vomiting Anesthetic complications: no   There were no known notable events for this encounter.   Last Vitals:  Vitals:   01/14/23 1200 01/14/23 1209  BP: 125/74 130/76  Pulse: 68 68  Resp: (!) 21 14  Temp:    SpO2: 97%     Last Pain:  Vitals:   01/14/23 1113  TempSrc: Temporal  PainSc: 0-No pain                 Cleda Mccreedy [name]

## 2023-01-14 NOTE — Transfer of Care (Signed)
Immediate Anesthesia Transfer of Care Note  Patient: [name]  Procedure(s) Performed: COLONOSCOPY WITH PROPOFOL POLYPECTOMY  Patient Location: PACU  Anesthesia Type:General  Level of Consciousness: drowsy  Airway & Oxygen Therapy: Patient Spontanous Breathing  Post-op Assessment: Report given to RN and Post -op Vital signs reviewed and stable  Post vital signs: Reviewed and stable  Last Vitals:  Vitals Value Taken Time  BP 97/70 01/14/23 1149  Temp    Pulse 65 01/14/23 1149  Resp 11 01/14/23 1149  SpO2 93 % 01/14/23 1149  Vitals shown include unfiled device data.  Last Pain:  Vitals:   01/14/23 1113  TempSrc: Temporal  PainSc: 0-No pain         Complications: There were no known notable events for this encounter.

## 2023-01-15 ENCOUNTER — Encounter: Payer: Self-pay | Admitting: Gastroenterology

## 2023-01-15 LAB — SURGICAL PATHOLOGY

## 2023-01-23 DIAGNOSIS — R29818 Other symptoms and signs involving the nervous system: Secondary | ICD-10-CM | POA: Insufficient documentation

## 2023-02-04 ENCOUNTER — Emergency Department
Admission: EM | Admit: 2023-02-04 | Discharge: 2023-02-04 | Disposition: A | Discharge status: Home / Self Care | Payer: Medicare PPO | Attending: Emergency Medicine | Admitting: Emergency Medicine

## 2023-02-04 ENCOUNTER — Emergency Department: Payer: Medicare PPO

## 2023-02-04 ENCOUNTER — Other Ambulatory Visit: Payer: Self-pay

## 2023-02-04 DIAGNOSIS — R0982 Postnasal drip: Secondary | ICD-10-CM | POA: Diagnosis not present

## 2023-02-04 DIAGNOSIS — R079 Chest pain, unspecified: Secondary | ICD-10-CM | POA: Insufficient documentation

## 2023-02-04 DIAGNOSIS — Z20822 Contact with and (suspected) exposure to covid-19: Secondary | ICD-10-CM | POA: Diagnosis not present

## 2023-02-04 DIAGNOSIS — I2510 Atherosclerotic heart disease of native coronary artery without angina pectoris: Secondary | ICD-10-CM | POA: Insufficient documentation

## 2023-02-04 DIAGNOSIS — I10 Essential (primary) hypertension: Secondary | ICD-10-CM | POA: Diagnosis not present

## 2023-02-04 DIAGNOSIS — R051 Acute cough: Secondary | ICD-10-CM | POA: Insufficient documentation

## 2023-02-04 LAB — RESP PANEL BY RT-PCR (RSV, FLU A&B, COVID)  RVPGX2
Influenza A by PCR: NEGATIVE
Influenza B by PCR: NEGATIVE
Resp Syncytial Virus by PCR: NEGATIVE
SARS Coronavirus 2 by RT PCR: NEGATIVE

## 2023-02-04 LAB — CBC
HCT: 43.3 % (ref 36.0–46.0)
Hemoglobin: 14.3 g/dL (ref 12.0–15.0)
MCH: 30.1 pg (ref 26.0–34.0)
MCHC: 33 g/dL (ref 30.0–36.0)
MCV: 91.2 fL (ref 80.0–100.0)
Platelets: 271 10*3/uL (ref 150–400)
RBC: 4.75 MIL/uL (ref 3.87–5.11)
RDW: 13.9 % (ref 11.5–15.5)
WBC: 6.1 10*3/uL (ref 4.0–10.5)
nRBC: 0 % (ref 0.0–0.2)

## 2023-02-04 LAB — TROPONIN I (HIGH SENSITIVITY): Troponin I (High Sensitivity): 4 ng/L (ref <18)

## 2023-02-04 LAB — BASIC METABOLIC PANEL
Anion gap: 8 (ref 5–15)
BUN: 12 mg/dL (ref 8–23)
CO2: 27 mmol/L (ref 22–32)
Calcium: 9.5 mg/dL (ref 8.9–10.3)
Chloride: 102 mmol/L (ref 98–111)
Creatinine, Ser: 1.02 mg/dL — ABNORMAL HIGH (ref 0.44–1.00)
GFR, Estimated: 59 mL/min — ABNORMAL LOW (ref >60)
Glucose, Bld: 90 mg/dL (ref 70–99)
Potassium: 4.2 mmol/L (ref 3.5–5.1)
Sodium: 137 mmol/L (ref 135–145)

## 2023-02-04 LAB — TSH: TSH: 2.101 u[IU]/mL (ref 0.350–4.500)

## 2023-02-04 LAB — MAGNESIUM: Magnesium: 2.4 mg/dL (ref 1.7–2.4)

## 2023-02-04 LAB — T4, FREE: Free T4: 0.79 ng/dL (ref 0.61–1.12)

## 2023-02-04 NOTE — Discharge Instructions (Signed)
Fortunately your evaluation in the Emergency Department did not show any emergency conditions that account for your symptoms today.  For the "postnasal drip" you can trial nasal saline irrigation by finding a device at your local pharmacy and use daily.  You can also try intranasal corticosteroids like fluticasone sprays, which you can find over-the-counter for purchase at your local pharmacy as well.   Thank you for choosing [place] for your health care today!  Please see your primary doctor this week for a follow up appointment.   If you have any new, worsening, or unexpected symptoms call your doctor right away or come back to the emergency department for reevaluation.  It was my pleasure to care for you today.   Daneil Dan [place] Charon, MD

## 2023-02-04 NOTE — ED Triage Notes (Signed)
Pt to ED via POV from home. Pt reports palpitations, HA and congestion x2 days. Pt also reports intermittent left breast pain. Pt reports hx of MI. Pt denies blood thinners.

## 2023-02-04 NOTE — ED Provider Notes (Signed)
[facility] Provider Note    Event Date/Time   First MD Initiated Contact with Patient 02/04/23 1723     (approximate)   History   Palpitations   HPI  [name] is a 72 y.o. female   Past medical history of CAD with prior STEMI, connective tissue disease, hypertension, Raynaud's, who presents to the emergency department with a chief complaint of a dry cough and postnasal drip, sinus congestion for the last 2 days.  No fevers or chills.  No shortness of breath.  Nonproductive cough.  No known sick contacts.  She was dealing with the symptoms but decided to come to the emergency department after noticing that over the last 2 days she has intermittent left-sided sharp chest pain that last for several seconds and resolved spontaneously as well.  These are nonexertional nonradiating.  There is no rash.  There is no inciting event like trauma or twisting/turning.  She currently feels well but feels congested in her sinuses.  Independent Historian contributed to assessment above: She has a friend who is at bedside to corroborate information past medical history as above    Physical Exam   Triage Vital Signs: ED Triage Vitals [02/04/23 1713]  Encounter Vitals Group     BP (!) 156/76     Systolic BP Percentile      Diastolic BP Percentile      Pulse Rate 68     Resp 20     Temp 98.1 F (36.7 C)     Temp Source Oral     SpO2 99 %     Weight      Height      Head Circumference      Peak Flow      Pain Score 4     Pain Loc      Pain Education      Exclude from Growth Chart     Most recent vital signs: Vitals:   02/04/23 1713  BP: (!) 156/76  Pulse: 68  Resp: 20  Temp: 98.1 F (36.7 C)  SpO2: 99%    General: Awake, no distress.  CV:  Good peripheral perfusion.  Resp:  Normal effort.  Abd:  No distention.  Other:  Awake alert pleasant woman in no acute distress with mild hypertension otherwise vital signs normal.  Breathing comfortably  with clear lungs no obvious heart murmurs.  Skin appears warm and well-perfused.  Posterior oropharynx peers normal without lesions exudates or masses.  Left TM occluded by cerumen but the right one appears normal without any signs of infection.  No obvious cervical lymphadenopathy.  Left chest wall without any rashes.  No tenderness to palpation in the area that she experiences intermittent sharp pains.   ED Results / Procedures / Treatments   Labs (all labs ordered are listed, but only abnormal results are displayed) Labs Reviewed  BASIC METABOLIC PANEL - Abnormal; Notable for the following components:      Result Value   Creatinine, Ser 1.02 (*)    GFR, Estimated 59 (*)    All other components within normal limits  RESP PANEL BY RT-PCR (RSV, FLU A&B, COVID)  [id number]  CBC  MAGNESIUM  TSH  T4, FREE  TROPONIN I (HIGH SENSITIVITY)     I ordered and reviewed the above labs they are notable for cell counts and electrolytes largely unremarkable and initial troponin is 4.  EKG  ED ECG REPORT I, Pilar Jarvis, the attending physician, personally viewed  and interpreted this ECG.   Date: 02/04/2023  EKG Time: 1711  Rate: 66  Rhythm: sinus  Axis: nl  Intervals:none  ST&T Change: no stemi    RADIOLOGY I independently reviewed and interpreted chest x-ray and I see no obvious focalities or pneumothorax I also reviewed radiologist's formal read.   PROCEDURES:  Critical Care performed: No  Procedures   MEDICATIONS ORDERED IN ED: Medications - No data to display   IMPRESSION / MDM / ASSESSMENT AND PLAN / ED COURSE  I reviewed the triage vital signs and the nursing notes.                                Patient's presentation is most consistent with acute presentation with potential threat to life or bodily function.  Differential diagnosis includes, but is not limited to, viral URI, postnasal drip, ACS, PE, dissection, bacterial pneumonia, costochondritis,  pneumothorax   The patient is on the cardiac monitor to evaluate for evidence of arrhythmia and/or significant heart rate changes.  MDM:    Is a patient with symptoms that are most likely resembling a viral URI with congestion, cough, and intermittent left-sided chest pain that is nonexertional nonradiating and very atypical for cardiac ischemia or other cardiopulmonary emergencies like dissection, PE.  I considered bacterial pneumonia but with clear chest x-ray no obvious focalities on auscultation and no leukocytosis or fever I doubt this I will defer antibiotics at this time.  No signs of shingles.  Given her history of STEMI, I will evaluate with EKG and troponin.  EKG is nonischemic.  Initial troponin is 4 and given that 2 days of symptoms intermittent I doubt a second troponin will be provided much utility today given very low clinical suspicion that this is caused by cardiac ischemia.  Additional lab test including electrolytes and thyroid were ordered in triage complaint of palpitations though patient denies any palpitations along with her symptoms as above.  So long as these lab tests unremarkable and chest x-ray unrevealing pending final read, plan will be for discharge home with close PMD follow-up and anticipatory guidance for postnasal drip/viral URI symptoms.       FINAL CLINICAL IMPRESSION(S) / ED DIAGNOSES   Final diagnoses:  Nonspecific chest pain  Postnasal drip  Acute cough     Rx / DC Orders   ED Discharge Orders     None        Note:  This document was prepared using Dragon voice recognition software and may include unintentional dictation errors.    Pilar Jarvis, MD 02/04/23 [phone]

## 2023-02-04 NOTE — ED Notes (Signed)
See triage note  States she has had headache and congestion for a few days  Thought is was sinus  Has taken OTC meds with min relief  Also  was having some palpitations  Afebrile on arrival

## 2023-03-28 ENCOUNTER — Other Ambulatory Visit: Payer: Self-pay | Admitting: Podiatry

## 2023-04-05 ENCOUNTER — Encounter
Admission: RE | Admit: 2023-04-05 | Discharge: 2023-04-05 | Disposition: A | Discharge status: Home / Self Care | Payer: Medicare PPO | Source: Ambulatory Visit | Attending: Podiatry | Admitting: Podiatry

## 2023-04-05 ENCOUNTER — Encounter: Payer: Self-pay | Admitting: Urgent Care

## 2023-04-05 ENCOUNTER — Other Ambulatory Visit: Payer: Self-pay

## 2023-04-05 HISTORY — DX: Polyneuropathy, unspecified: G62.9

## 2023-04-05 HISTORY — DX: Gastro-esophageal reflux disease without esophagitis: K21.9

## 2023-04-05 HISTORY — DX: Nonalcoholic steatohepatitis (NASH): K75.81

## 2023-04-05 HISTORY — DX: Systemic lupus erythematosus, unspecified: M32.9

## 2023-04-05 HISTORY — DX: Unspecified osteoarthritis, unspecified site: M19.90

## 2023-04-05 HISTORY — DX: Prediabetes: R73.03

## 2023-04-05 NOTE — Patient Instructions (Signed)
 Your procedure is scheduled on: Thursday 04/11/23 To find out your arrival time, please call [phone] between 1PM - 3PM on:  Wednesday 04/10/23  Report to the Registration Desk on the 1st floor of the [facility]. Free Valet parking is available.  If your arrival time is 6:00 am, do not arrive before that time as the [facility] entrance doors do not open until 6:00 am.  REMEMBER: Instructions that are not followed completely may result in serious medical risk, up to and including death; or upon the discretion of your surgeon and anesthesiologist your surgery may need to be rescheduled.  Do not eat food after midnight the night before surgery.  No gum chewing or hard candies.  You may however, drink CLEAR liquids up to 2 hours before you are scheduled to arrive for your surgery. Do not drink anything within 2 hours of your scheduled arrival time.  Clear liquids include: - water  - apple juice without pulp - gatorade (not RED colors) - black coffee or tea (Do NOT add milk or creamers to the coffee or tea) Do NOT drink anything that is not on this list.  In addition, your doctor has ordered for you to drink the provided:  Ensure Pre-Surgery Clear Carbohydrate Drink  Drinking this carbohydrate drink up to two hours before surgery helps to reduce insulin resistance and improve patient outcomes. Please complete drinking 2 hours before scheduled arrival time.  One week prior to surgery: Stop Anti-inflammatories (NSAIDS) such as Advil, Aleve, Ibuprofen, Motrin, Naproxen, Naprosyn and Aspirin based products such as Excedrin, Goody's Powder, BC Powder. You may however, continue to take Tylenol if needed for pain up until the day of surgery.  Stop ANY OVER THE COUNTER supplements and vitamins until after surgery.  Continue taking all prescribed medications.   TAKE ONLY THESE MEDICATIONS THE MORNING OF SURGERY WITH A SIP OF WATER:  amLODipine (NORVASC) 2.5 MG tablet  mirabegron ER  (MYRBETRIQ) 50 MG TB24 tablet  omeprazole (PRILOSEC) 20 MG capsule  venlafaxine XR (EFFEXOR-XR) 75 MG 24 hr capsule   No Alcohol for 24 hours before or after surgery.  No Smoking including e-cigarettes for 24 hours before surgery.  No chewable tobacco products for at least 6 hours before surgery.  No nicotine patches on the day of surgery.  Do not use any "recreational" drugs for at least a week (preferably 2 weeks) before your surgery.  Please be advised that the combination of cocaine and anesthesia may have negative outcomes, up to and including death. If you test positive for cocaine, your surgery will be cancelled.  On the morning of surgery brush your teeth with toothpaste and water, you may rinse your mouth with mouthwash if you wish. Do not swallow any toothpaste or mouthwash.  Use CHG Soap or wipes as directed on instruction sheet.  Do not wear lotions, powders, or perfumes.   Do not shave body hair from the neck down 48 hours before surgery.  Wear clean comfortable clothing (specific to your surgery type) to the hospital.  Do not wear jewelry, make-up, hairpins, clips or nail polish.  For welded (permanent) jewelry: bracelets, anklets, waist bands, etc.  Please have this removed prior to surgery.  If it is not removed, there is a chance that hospital personnel will need to cut it off on the day of surgery. Contact lenses, hearing aids and dentures may not be worn into surgery.  Do not bring valuables to the hospital. [facility] is not responsible  for any missing/lost belongings or valuables.   Notify your doctor if there is any change in your medical condition (cold, fever, infection).  If you are being discharged the day of surgery, you will not be allowed to drive home. You will need a responsible individual to drive you home and stay with you for 24 hours after surgery.   If you are taking public transportation, you will need to have a responsible individual with  you.  If you are being admitted to the hospital overnight, leave your suitcase in the car. After surgery it may be brought to your room.  In case of increased patient census, it may be necessary for you, the patient, to continue your postoperative care in the Same Day Surgery department.  After surgery, you can help prevent lung complications by doing breathing exercises.  Take deep breaths and cough every 1-2 hours. Your doctor may order a device called an Incentive Spirometer to help you take deep breaths. When coughing or sneezing, hold a pillow firmly against your incision with both hands. This is called "splinting." Doing this helps protect your incision. It also decreases belly discomfort.  Surgery Visitation Policy:  Patients undergoing a surgery or procedure may have two family members or support persons with them as long as the person is not COVID-19 positive or experiencing its symptoms.   Inpatient Visitation:    Visiting hours are 7 a.m. to 8 p.m. Up to four visitors are allowed at one time in a patient room. The visitors may rotate out with other people during the day. One designated support person (adult) may remain overnight.  Due to an increase in RSV and influenza rates and associated hospitalizations, children ages 20 and under will not be able to visit patients in [facility]. Masks continue to be strongly recommended.  Please call the Pre-admissions Testing Dept. at [phone] if you have any questions about these instructions.     Preparing for Surgery with CHLORHEXIDINE GLUCONATE (CHG) Soap  Chlorhexidine Gluconate (CHG) Soap  o An antiseptic cleaner that kills germs and bonds with the skin to continue killing germs even after washing  o Used for showering the night before surgery and morning of surgery  Before surgery, you can play an important role by reducing the number of germs on your skin.  CHG (Chlorhexidine gluconate) soap is an  antiseptic cleanser which kills germs and bonds with the skin to continue killing germs even after washing.  Please do not use if you have an allergy to CHG or antibacterial soaps. If your skin becomes reddened/irritated stop using the CHG.  1. Shower the NIGHT BEFORE SURGERY and the MORNING OF SURGERY with CHG soap.  2. If you choose to wash your hair, wash your hair first as usual with your normal shampoo.  3. After shampooing, rinse your hair and body thoroughly to remove the shampoo.  4. Use CHG as you would any other liquid soap. You can apply CHG directly to the skin and wash gently with a scrungie or a clean washcloth.  5. Apply the CHG soap to your body only from the neck down. Do not use on open wounds or open sores. Avoid contact with your eyes, ears, mouth, and genitals (private parts). Wash face and genitals (private parts) with your normal soap.  6. Wash thoroughly, paying special attention to the area where your surgery will be performed.  7. Thoroughly rinse your body with warm water.  8. Do not shower/wash with  your normal soap after using and rinsing off the CHG soap.  9. Pat yourself dry with a clean towel.  10. Wear clean pajamas to bed the night before surgery.  12. Place clean sheets on your bed the night of your first shower and do not sleep with pets.  13. Shower again with the CHG soap on the day of surgery prior to arriving at the hospital.  14. Do not apply any deodorants/lotions/powders.  15. Please wear clean clothes to the hospital.

## 2023-04-05 NOTE — Pre-Procedure Instructions (Signed)
 Pt "allergic" to Vancomycin. (See allergy list) Allergist recommendations give Benadryl 50 mg and run Vancomycin slow. Benadryl pre op ordered per Dr Excell Seltzer.

## 2023-04-09 ENCOUNTER — Encounter: Payer: Self-pay | Admitting: Urgent Care

## 2023-04-11 ENCOUNTER — Encounter: Admission: RE | Payer: Self-pay | Source: Home / Self Care

## 2023-04-11 ENCOUNTER — Ambulatory Visit: Admission: RE | Admit: 2023-04-11 | Payer: Medicare PPO | Source: Home / Self Care | Admitting: Podiatry

## 2023-04-11 SURGERY — OPEN REDUCTION INTERNAL FIXATION (ORIF) ANKLE FRACTURE
Anesthesia: Choice | Site: Ankle | Laterality: Left

## 2023-07-17 ENCOUNTER — Other Ambulatory Visit: Payer: Self-pay | Admitting: Family Medicine

## 2023-07-17 DIAGNOSIS — Z1231 Encounter for screening mammogram for malignant neoplasm of breast: Secondary | ICD-10-CM

## 2023-07-29 ENCOUNTER — Ambulatory Visit
Admission: RE | Admit: 2023-07-29 | Discharge: 2023-07-29 | Disposition: A | Discharge status: Home / Self Care | Source: Ambulatory Visit | Attending: Family Medicine | Admitting: Family Medicine

## 2023-07-29 DIAGNOSIS — Z1231 Encounter for screening mammogram for malignant neoplasm of breast: Secondary | ICD-10-CM | POA: Insufficient documentation

## 2023-12-13 ENCOUNTER — Other Ambulatory Visit: Payer: Self-pay

## 2023-12-13 ENCOUNTER — Emergency Department
Admission: EM | Admit: 2023-12-13 | Discharge: 2023-12-13 | Discharge status: Against Medical Advice | Attending: Emergency Medicine | Admitting: Emergency Medicine

## 2023-12-13 DIAGNOSIS — Z5321 Procedure and treatment not carried out due to patient leaving prior to being seen by health care provider: Secondary | ICD-10-CM | POA: Diagnosis not present

## 2023-12-13 DIAGNOSIS — R1010 Upper abdominal pain, unspecified: Secondary | ICD-10-CM | POA: Insufficient documentation

## 2023-12-13 LAB — COMPREHENSIVE METABOLIC PANEL WITH GFR
ALT: 21 U/L (ref 0–44)
AST: 27 U/L (ref 15–41)
Albumin: 4.5 g/dL (ref 3.5–5.0)
Alkaline Phosphatase: 70 U/L (ref 38–126)
Anion gap: 11 (ref 5–15)
BUN: 11 mg/dL (ref 8–23)
CO2: 26 mmol/L (ref 22–32)
Calcium: 10 mg/dL (ref 8.9–10.3)
Chloride: 100 mmol/L (ref 98–111)
Creatinine, Ser: 0.81 mg/dL (ref 0.44–1.00)
GFR, Estimated: >60 mL/min (ref >60)
Glucose, Bld: 91 mg/dL (ref 70–99)
Potassium: 4.4 mmol/L (ref 3.5–5.1)
Sodium: 137 mmol/L (ref 135–145)
Total Bilirubin: 0.8 mg/dL (ref 0.0–1.2)
Total Protein: 8.5 g/dL — ABNORMAL HIGH (ref 6.5–8.1)

## 2023-12-13 LAB — URINALYSIS, ROUTINE W REFLEX MICROSCOPIC
Bilirubin Urine: NEGATIVE
Glucose, UA: NEGATIVE mg/dL
Hgb urine dipstick: NEGATIVE
Ketones, ur: NEGATIVE mg/dL
Leukocytes,Ua: NEGATIVE
Nitrite: NEGATIVE
Protein, ur: NEGATIVE mg/dL
Specific Gravity, Urine: 1.011 (ref 1.005–1.030)
pH: 6 (ref 5.0–8.0)

## 2023-12-13 LAB — CBC
HCT: 47 % — ABNORMAL HIGH (ref 36.0–46.0)
Hemoglobin: 15.4 g/dL — ABNORMAL HIGH (ref 12.0–15.0)
MCH: 30.5 pg (ref 26.0–34.0)
MCHC: 32.8 g/dL (ref 30.0–36.0)
MCV: 93.1 fL (ref 80.0–100.0)
Platelets: 285 K/uL (ref 150–400)
RBC: 5.05 MIL/uL (ref 3.87–5.11)
RDW: 13.1 % (ref 11.5–15.5)
WBC: 6.8 K/uL (ref 4.0–10.5)
nRBC: 0 % (ref 0.0–0.2)

## 2023-12-13 LAB — LIPASE, BLOOD: Lipase: 28 U/L (ref 11–51)

## 2023-12-13 NOTE — ED Triage Notes (Signed)
 Patient sent over from [facility] for upper abdominal pain that radiates into right mid back since Sunday.

## 2023-12-13 NOTE — ED Notes (Signed)
 Patient to first nurse desk asking about wait time. PT informed unable to give wait time. PT stating she no longer wanted to wait. Pt encouraged to stay by declined. NAD noted. Ambulatory out of waiting room.

## 2024-01-20 ENCOUNTER — Ambulatory Visit: Admitting: Internal Medicine

## 2024-01-20 VITALS — BP 148/77 | HR 65 | Resp 12 | Ht 63.0 in | Wt 159.1 lb

## 2024-01-20 DIAGNOSIS — G2581 Restless legs syndrome: Secondary | ICD-10-CM

## 2024-01-20 DIAGNOSIS — G4700 Insomnia, unspecified: Secondary | ICD-10-CM | POA: Insufficient documentation

## 2024-01-20 DIAGNOSIS — R29818 Other symptoms and signs involving the nervous system: Secondary | ICD-10-CM

## 2024-01-20 NOTE — Progress Notes (Signed)
 Sleep Medicine   Office Visit  Patient Name: [name] DOB: 27-Jun-1950 MRN [id number]    Chief Complaint: sleep evaluation   Brief History:  [name] presents for an initial sleep evaluaton with a reported 5 year history of trouble initiating sleep due to a busy mind at night. She reports her sleep quality is poor due to waking tired. This is noted most nights. The patient's bed partner reports  snoring at night. The patient relates the following symptoms: snoring that wakes her, headaches in the morning, focusing issues, brain fog and moodiness are also present. Patient reports sleepiness after meals and will nap. The patient goes to bed at 2-3am  and she reports going sleep with an over the counter benadryl  medication. Without it she will lay awake for 2-3 hrs. She reports waking up at 11:00a-12pm.  Sleep quality is worse when outside home environment.  Patient has noted restlessness of her legs at night.  The patient  relates  behavior during the night.  The patient reports a history of psychiatric problems (anxiety and depression) . The Epworth Sleepiness Score is 9 out of 24 .  The patient relates  Cardiovascular risk factors include: coronary artery disease, hypertension.  The patient reports using Ozempic from March until middle of November what was experiencing side effects and stopped using it.     ROS  General: (-) fever, (-) chills, (-) night sweat Nose and Sinuses: (-) nasal stuffiness or itchiness, (-) postnasal drip, (-) nosebleeds, (-) sinus trouble. Mouth and Throat: (-) sore throat, (-) hoarseness. Neck: (-) swollen glands, (-) enlarged thyroid , (-) neck pain. Respiratory: - cough, + shortness of breath, - wheezing. Neurologic: + numbness,+ tingling. Psychiatric: + anxiety, + depression Sleep behavior: -sleep paralysis -hypnogogic hallucinations -dream enactment      -vivid dreams -cataplexy -night terrors -sleep walking   Current Medication: Outpatient Encounter  Medications as of 01/20/2024  Medication Sig   amLODipine  (NORVASC ) 5 MG tablet Take 5 mg by mouth daily.   metoprolol  succinate (TOPROL -XL) 50 MG 24 hr tablet Take 50 mg by mouth daily.   OZEMPIC, 2 MG/DOSE, 8 MG/3ML SOPN See admin instructions.   acetaminophen  (TYLENOL ) 500 MG tablet Take 1,000 mg by mouth every 6 (six) hours as needed for moderate pain.   amLODipine  (NORVASC ) 2.5 MG tablet Take 2.5 mg by mouth daily.   clobetasol ointment (TEMOVATE) 0.05 % APPLY TO THE AFFECTED AREA(S) TOPICALLY AT BEDTIME   diazepam  (VALIUM ) 10 MG tablet Take 10 mg by mouth daily as needed for bladder spasms.   diphenhydrAMINE  (BENADRYL ) 25 mg capsule Take 25 mg by mouth every 6 (six) hours as needed for allergies.   docusate sodium  (COLACE) 100 MG capsule Take 1 capsule (100 mg total) by mouth 2 (two) times daily as needed for mild constipation.   DULoxetine (CYMBALTA) 20 MG capsule Take 60 mg by mouth daily.   EPINEPHrine  0.3 mg/0.3 mL IJ SOAJ injection Inject 0.3 mg into the muscle as needed for anaphylaxis.   estradiol (ESTRACE) 0.1 MG/GM vaginal cream Place 1 Applicatorful vaginally every 3 (three) days.   fluticasone  (FLONASE ) 50 MCG/ACT nasal spray Place 2 sprays into both nostrils daily as needed for allergies or rhinitis.   folic acid  (FOLVITE ) 1 MG tablet Take 1 mg by mouth 2 (two) times a week. (Patient not taking: Reported on 04/05/2023)   gabapentin  (NEURONTIN ) 300 MG capsule Take 300 mg by mouth at bedtime.   Guaifenesin  1200 MG TB12 Take 1,200 mg by mouth daily as  needed (congestion). (Patient not taking: Reported on 04/05/2023)   L-Lysine 500 MG TABS Take 500 mg by mouth daily as needed.   loratadine  (CLARITIN ) 10 MG tablet Take 10 mg by mouth daily as needed.   methocarbamol  (ROBAXIN ) 500 MG tablet Take 1 tablet (500 mg total) by mouth every 6 (six) hours as needed for muscle spasms. (Patient not taking: No sig reported)   metoprolol  tartrate (LOPRESSOR ) 25 MG tablet Take 25 mg by mouth at  bedtime.   mirabegron  ER (MYRBETRIQ ) 50 MG TB24 tablet Take 50 mg by mouth daily.   Multiple Minerals (CALCIUM -MAGNESIUM -ZINC ) TABS Take 1 tablet by mouth daily.   omeprazole (PRILOSEC) 20 MG capsule Take 20 mg by mouth daily.   polyethylene glycol (MIRALAX  / GLYCOLAX ) 17 g packet Take 17 g by mouth daily. (Patient taking differently: Take 17 g by mouth daily as needed.)   pyridOXINE  (VITAMIN B-6) 100 MG tablet Take 100 mg by mouth daily.   rosuvastatin  (CRESTOR ) 20 MG tablet Take 20 mg by mouth at bedtime.   SYMBICORT 160-4.5 MCG/ACT inhaler Inhale 2 puffs into the lungs 2 (two) times daily.   traMADol  (ULTRAM ) 50 MG tablet Take 1-2 tablets (50-100 mg total) by mouth every 6 (six) hours as needed for moderate pain. (Patient not taking: No sig reported)   valACYclovir (VALTREX) 500 MG tablet Take 500 mg by mouth as needed.   [DISCONTINUED] venlafaxine  XR (EFFEXOR -XR) 75 MG 24 hr capsule Take 75 mg by mouth 2 (two) times daily.   No facility-administered encounter medications on file as of 01/20/2024.    Surgical History: Past Surgical History:  Procedure Laterality Date   CESAREAN SECTION  1986   COLONOSCOPY WITH PROPOFOL  N/A 07/16/2017   Procedure: COLONOSCOPY WITH PROPOFOL ;  Surgeon: Gaylyn Gladis PENNER, MD;  Location: [facility] ENDOSCOPY;  Service: Endoscopy;  Laterality: N/A;   COLONOSCOPY WITH PROPOFOL  N/A 01/14/2023   Procedure: COLONOSCOPY WITH PROPOFOL ;  Surgeon: Maryruth Ole DASEN, MD;  Location: [facility] ENDOSCOPY;  Service: Endoscopy;  Laterality: N/A;   CORONARY ANGIOPLASTY WITH STENT PLACEMENT Left 12/2015   Procedure: CORONARY ANGIOPLASTY WITH STENT PLACEMENT; Location: [facility] [place], [place])   CORONARY STENT INTERVENTION Left 10/16/2011   Procedure: CORONARY STENT INTERVENTION; Location: [facility]; Surgeon: Marsa Dooms, MD   cystotomy repair  01/18/2014   JOINT REPLACEMENT Right 2018   both hip have been replaced   LEFT HEART CATH AND CORONARY ANGIOGRAPHY  Left 07/03/2016   Procedure: Left Heart Cath and Coronary Angiography;  Surgeon: Bosie Vinie LABOR, MD;  Location: [facility] INVASIVE CV LAB;  Service: Cardiovascular;  Laterality: Left;   LEFT HEART CATH AND CORONARY ANGIOGRAPHY Left 09/18/2011   Procedure: LEFT HEART CATH AND CORONARY ANGIOGRAPHY; Location: [facility]; Surgeon: Vinie Bosie, MD   LUMBAR LAMINECTOMY/DECOMPRESSION MICRODISCECTOMY N/A 12/22/2020   Procedure: Microlumbar decompression Lumbar four-five;  Surgeon: Duwayne Purchase, MD;  Location: [facility] OR;  Service: Orthopedics;  Laterality: N/A;   POLYPECTOMY  01/14/2023   Procedure: POLYPECTOMY;  Surgeon: Maryruth Ole DASEN, MD;  Location: [facility] ENDOSCOPY;  Service: Endoscopy;;   TONSILLECTOMY  1969   TOTAL ABDOMINAL HYSTERECTOMY W/ BILATERAL SALPINGOOPHORECTOMY Bilateral 01/19/2014   TOTAL HIP ARTHROPLASTY Right 09/12/2016   Procedure: RIGHT TOTAL HIP ARTHROPLASTY ANTERIOR APPROACH;  Surgeon: Melodi Lerner, MD;  Location: [facility] ORS;  Service: Orthopedics;  Laterality: Right;   TUBAL LIGATION  1986    Medical History: Past Medical History:  Diagnosis Date   Acute ST elevation myocardial infarction (STEMI) of inferior wall (HCC) 12/2015   a.)  Tx'd at [facility] in [place], [place]; b.) PCI placing a 3.5 x 30 mm Resolute DES to the RCA   Anxiety    Arthritis    Asthma    Connective tissue disease    a.) (+) FANA, SSA/SSB antibodies, (+) rheumatoid factor, (-) anti-CCP   Coronary artery disease 09/18/2011   a.) LHC 09/18/2011: 60% mLAD - staged PCI; b.) PCI 10/15/2011: 60% mLAD (2.5 x 12 mm Xience DES); c.) MV 02/23/2015: no ischemia; d.) s/p inferior STEMI 12/2015 --> PCI at [facility], [place]) --> 3.5 x 30 mm Resolute DES to RCA; e.) LHC 07/03/2016: minor lum irregs LCx. 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   Vaginal atrophy    Vaginal itching     Family History: Non contributory to the present illness  Social History: Social History   Socioeconomic History   Marital status: Widowed    Spouse name: Not on file   Number of children: 1   Years of education: Not on file   Highest education level: Not on file  Occupational History   Not on file  Tobacco Use   Smoking status: Former    Current packs/day: 0.00    Types: Cigarettes    Quit date: 02/13/1983    Years since quitting: 40.9   Smokeless tobacco: Never  Vaping Use   Vaping status: Never Used  Substance and Sexual Activity   Alcohol use: Yes    Alcohol/week: 14.0 standard drinks of alcohol    Types: 14 Cans of beer per week   Drug use: No   Sexual activity: Not Currently  Other Topics Concern   Not on file  Social History Narrative   Not on file   Social Drivers of Health   Financial Resource Strain: Low Risk  (10/16/2023)   Received from [facility]   Overall Financial Resource Strain (CARDIA)    Difficulty of Paying Living Expenses: Not hard at all  Food Insecurity: No Food Insecurity (10/16/2023)   Received from [facility]   Hunger Vital Sign    Within the past 12 months, you worried that your food would run out before you got the money to buy more.: Never true    Within the past 12 months, the food you bought just didn't last and you didn't have money to get more.: Never true  Transportation Needs: No Transportation Needs (10/16/2023)   Received from [facility] - Transportation    In the past 12  months, has lack of transportation kept you from medical appointments or from getting medications?: No    Lack of Transportation (Non-Medical): No  Physical Activity: Not on file  Stress: Not on file  Social Connections: Not on file  Intimate Partner Violence: Not At Risk (09/25/2023)   Received from [facility]  Humiliation, Afraid, Rape, and Kick questionnaire    Within the last year, have you been afraid of your partner or ex-partner?: No    Within the last year, have you been humiliated or emotionally abused in other ways by your partner or ex-partner?: No    Within the last year, have you been kicked, hit, slapped, or otherwise physically hurt by your partner or ex-partner?: No    Within the last year, have you been raped or forced to have any kind of sexual activity by your partner or ex-partner?: No    Vital Signs: Blood pressure (!) 148/77, pulse 65, resp. rate 12, height 5' 3 (1.6 m), weight 159 lb 1.6 oz (72.2 kg), SpO2 97%. Body mass index is 28.18 kg/m.   Examination: General Appearance: The patient is well-developed, well-nourished, and in no distress. Neck Circumference: 39cm Skin: Gross inspection of skin unremarkable. Head: normocephalic, no gross deformities. Eyes: no gross deformities noted. ENT: ears appear grossly normal Neurologic: Alert and oriented. No involuntary movements.    STOP BANG RISK ASSESSMENT S (snore) Have you been told that you snore?     YES   T (tired) Are you often tired, fatigued, or sleepy during the day?   YES  O (obstruction) Do you stop breathing, choke, or gasp during sleep? NO   P (pressure) Do you have or are you being treated for high blood pressure? YES   B (BMI) Is your body index greater than 35 kg/m? NO   A (age) Are you 8 years old or older? YES   N (neck) Do you have a neck circumference greater than 16 inches?   NO   G (gender) Are you a female? NO   TOTAL STOP/BANG "YES" ANSWERS 4                                                                A STOP-Bang score of 2 or less is considered low risk, and a score of 5 or more is high risk for having either moderate or severe OSA. For people who score 3 or 4, doctors may need to perform further assessment to determine how likely they are to have OSA.         EPWORTH SLEEPINESS SCALE:  Scale:  (0)= no chance of dozing; (1)= slight chance of dozing; (2)= moderate chance of dozing; (3)= high chance of dozing  Chance  Situtation    Sitting and reading: 1    Watching TV: 1    Sitting Inactive in public: 1    As a passenger in car: 0      Lying down to rest: 3    Sitting and talking: 0    Sitting quielty after lunch: 2    In a car, stopped in traffic: 1   TOTAL SCORE:   9 out of 24    SLEEP STUDIES:  None   LABS: Recent Results (from the past 2160 hours)  Lipase, blood     Status: None   Collection Time: 12/13/23  3:56 PM  Result Value Ref Range   Lipase 28 11 - 51 U/L    Comment: Performed at [facility], [address] [place], [place] [address]  Comprehensive metabolic panel     Status: Abnormal  Collection Time: 12/13/23  3:56 PM  Result Value Ref Range   Sodium 137 135 - 145 mmol/L   Potassium 4.4 3.5 - 5.1 mmol/L   Chloride 100 98 - 111 mmol/L   CO2 26 22 - 32 mmol/L   Glucose, Bld 91 70 - 99 mg/dL    Comment: Glucose reference range applies only to samples taken after fasting for at least 8 hours.   BUN 11 8 - 23 mg/dL   Creatinine, Ser 9.18 0.44 - 1.00 mg/dL   Calcium  10.0 8.9 - 10.3 mg/dL   Total Protein 8.5 (H) 6.5 - 8.1 g/dL   Albumin 4.5 3.5 - 5.0 g/dL   AST 27 15 - 41 U/L   ALT 21 0 - 44 U/L   Alkaline Phosphatase 70 38 - 126 U/L   Total Bilirubin 0.8 0.0 - 1.2 mg/dL   GFR, Estimated >39 >39 mL/min    Comment: (NOTE) Calculated using the CKD-EPI Creatinine Equation (2021)    Anion gap 11 5 - 15    Comment: Performed at [facility], [address] [place], [place] [address]  CBC      Status: Abnormal   Collection Time: 12/13/23  3:56 PM  Result Value Ref Range   WBC 6.8 4.0 - 10.5 K/uL   RBC 5.05 3.87 - 5.11 MIL/uL   Hemoglobin 15.4 (H) 12.0 - 15.0 g/dL   HCT 52.9 (H) 63.9 - 53.9 %   MCV 93.1 80.0 - 100.0 fL   MCH 30.5 26.0 - 34.0 pg   MCHC 32.8 30.0 - 36.0 g/dL   RDW 86.8 88.4 - 84.4 %   Platelets 285 150 - 400 K/uL   nRBC 0.0 0.0 - 0.2 %    Comment: Performed at [facility], [address] [place], [place] [address]  Urinalysis, Routine w reflex microscopic -Urine, Clean Catch     Status: Abnormal   Collection Time: 12/13/23  3:56 PM  Result Value Ref Range   Color, Urine YELLOW (A) YELLOW   APPearance CLEAR (A) CLEAR   Specific Gravity, Urine 1.011 1.005 - 1.030   pH 6.0 5.0 - 8.0   Glucose, UA NEGATIVE NEGATIVE mg/dL   Hgb urine dipstick NEGATIVE NEGATIVE   Bilirubin Urine NEGATIVE NEGATIVE   Ketones, ur NEGATIVE NEGATIVE mg/dL   Protein, ur NEGATIVE NEGATIVE mg/dL   Nitrite NEGATIVE NEGATIVE   Leukocytes,Ua NEGATIVE NEGATIVE    Comment: Performed at [facility], [address], [place], [place] [address]    Radiology: No results found.  No results found.  No results found.    Assessment and Plan: Patient Active Problem List   Diagnosis Date Noted   Insomnia 01/20/2024   Suspected sleep apnea 01/23/2023   Spinal stenosis at L4-L5 level 12/22/2020   RLS (restless legs syndrome) 03/09/2020   Hypertension, essential 05/29/2018   Systemic lupus erythematosus (HCC) 09/20/2017   OA (osteoarthritis) of hip 09/12/2016   History of ST elevation myocardial infarction (STEMI) 01/23/2016   ST elevation (STEMI) myocardial infarction of unspecified site [facility]) 01/03/2016   Recurrent urinary tract infection 07/07/2014   Raynaud's disease 07/07/2014   Chronic coronary artery disease 08/31/2013   Connective tissue disease 08/31/2013   Coronary disease 08/31/2013   1. Suspected sleep apnea (Primary) Patient evaluation  suggests high risk of sleep disordered breathing due to snoring, frequent awakening, non restorative sleep, brain fog.  Patient has comorbid cardiovascular risk factors including: hypertension and coronary artery disease which could be exacerbated by  pathologic sleep-disordered breathing.  Suggest: PSG to assess/treat the patient's sleep disordered breathing. The patient was also counselled on weight loss to optimize sleep health.   2. Insomnia, unspecified type Sleep hygiene reviewed. Recommended stopping benadryl  due to risks. Take gabapentin  earlier (90 min prior to bedtime, and turn off phone at same time.   3. RLS (restless legs syndrome) Continue gabapentin .     General Counseling: I have discussed the findings of the evaluation and examination with [name]  I have also discussed any further diagnostic evaluation thatmay be needed or ordered today. [name] verbalizes understanding of the findings of todays visit. We also reviewed her medications today and discussed drug interactions and side effects including but not limited excessive drowsiness and altered mental states. We also discussed that there is always a risk not just to her but also people around her. she has been encouraged to call the office with any questions or concerns that should arise related to todays visit.  No orders of the defined types were placed in this encounter.       I have personally obtained a history, evaluated the patient, evaluated pertinent data, formulated the assessment and plan and placed orders.   This patient was seen today by Lauraine Lay, PA-C in collaboration with Dr. Elfreda Bathe.   Elfreda DELENA Bathe, MD [facility] Diplomate ABMS Pulmonary and Critical Care Medicine Sleep medicine

## 2024-02-20 ENCOUNTER — Encounter (INDEPENDENT_AMBULATORY_CARE_PROVIDER_SITE_OTHER): Payer: Self-pay | Admitting: Internal Medicine

## 2024-02-20 DIAGNOSIS — G4733 Obstructive sleep apnea (adult) (pediatric): Secondary | ICD-10-CM

## 2024-02-28 NOTE — Procedures (Signed)
 [facility]  Polysomnogram Report Part I                                                               Phone: [phone] Fax: [phone]  Patient Name: [name] Acquisition Number: [id number]  Date of Birth: 05/15/1950 Acquisition Date: 02/20/2024  Referring Physician: Velma Bevels, MD     History: The patient is a 74 year old  who was referred for evaluation of possible sleep apnea with snoring and excessive daytime sleepiness. Medical History: snoring, headaches, mood swings, possible OSA, STEMI of inferior wall, anxiety, arthritis, asthma, connective tissue disease, CAD, cystocele, depression, eczema, GERD, HLD, hypertension, Lupus, NASH, peripheral neuropathy, PMB, pre-diabetes, Raynaud disease, rectocele, recurrent UTI, stress urinary incontinence, unstable bladder, urinary frequency, uterus descensus, vaginal atrophy, vaginal itching.  Medications: amlodipine , metoprolol , Ozempic, acetaminophen , clobetasol ointment, diazepam , diphenhydramine , docusae sodium, duloxetine, epinephrine  injection, estradiol, fluticasone , folic acid , gabapentin , Guaifenesin  tablet, L-Lysine tabs, methocarbamol , metoprolol  tartrate, mirabegron  ER, mineral tabs, omeprazole, polyethylene glycol, pyridoxine , rosubastatin, symbicort, tramadol , valacyclovir.  Procedure: This routine overnight polysomnogram was performed on the [facility] 5 using the standard diagnostic protocol. This included 6 channels of EEG, 2 channels of EOG, chin EMG, bilateral anterior tibialis EMG, nasal/oral thermistor, PTAF (nasal pressure transducer), chest and abdominal wall movements, EKG, and pulse oximetry.  Description: The total recording time was 383.5 minutes. The total sleep time was 235.0 minutes. There were a total of 116.0 minutes of wakefulness after sleep onset for a reducedsleep efficiency of 61.3%. The latency to sleep onset was slightly prolongedat 32.5 minutes. The R sleep onset latency was short at 40.0 minutes. Sleep  parameters, as a percentage of the total sleep time, demonstrated 6.6% of sleep was in N1 sleep, 27.2% N2, 65.7% N3 and 0.4% R sleep. There were a total of 78 arousals for an arousal index of 19.9 arousals per hour of sleep that was elevated.  Respiratory monitoring demonstrated  snoring . There were 58 apneas and hypopneas for an Apnea Hypopnea Index of 14.8 apneas and hypopneas per hour of sleep. The REM related apnea hypopnea index was 0.0/hr. of REM sleep compared to a NREM AHI of 14.9/hr.  The average duration of the respiratory events was 14.5 seconds with a maximum duration of 29.0 seconds. The respiratory events occurred almost exclusively in the supine position with an AHI of 38.6. The respiratory events were associated with peripheral oxygen desaturations on the average to 85%. The lowest oxygen desaturation associated with a respiratory event was 83%. Additionally, the baseline oxygen saturation during wakefulness was 91%, during NREM sleep averaged 91%, and during REM sleep averaged 93%. The total duration of oxygen < 90% was 51.8 minutes and <80% was 0.0 minutes.  Cardiac monitoring-  demonstrate transient cardiac decelerations associated with the apneas.  significant cardiac rhythm irregularities.   Periodic limb movement monitoring- demonstrated that there were 25 periodic limb movements for a periodic limb movement index of 6.4 periodic limb movements per hour of sleep. Quasi-periodic limb movements were observed during some periods of wakefulness.  Impression: This routine overnight polysomnogram demonstrated significant, positional obstructive sleep apnea with an overall Apnea Hypopnea Index of 14.8 apneas and hypopneas per hour of sleep. The respiratory occurred almost exclusively in the supine position with an AHI of 38.6.  The lowest desaturation to 83%. Virtually no REM sleep was observed which may have caused an underestimation of the severity of the sleep apnea.  There were few  periodic limb movements that commonly are not significant. Quasi-periodic limb movements were observed during some periods of wakefulness. The patient reports symptoms suggestive of restless leg syndrome. Sometimes these limb movements subside once the apnea is controlled.  reduced sleep efficiency with a prolonged sleep latency. Anelevated arousal index,increased awakeningsand virtually no REM sleep were observed. These findings would appear to be due to the combination of obstructive sleep apnea and psychophysiological factors such as first night effect and leg discomfort.   Recommendations:    A CPAP titration would be recommended due to the severity of the sleep apnea. Some supine sleep should be ensured to optimize the titration. Avoid the use of Benadryl  and other produced containing diphenhydramine  as these can cause or exacerbation restless legs. Additionally, would recommend weight loss in a patient with a BMI of 28.2.     Kristina RONAL Bathe, MD, [facility] Diplomate ABMS-Pulmonary, Critical Care and Sleep Medicine  Electronically reviewed and digitally signed  [facility] Polysomnogram Report Part II  Phone: [phone] Fax: [phone]  Patient last name Tucker Neck Size 15.0 in. Acquisition [id number]  Patient first name Kristina H. Weight 159.0 lbs. Started 02/20/2024 at 10:17:04 PM  Birth date Mar 15, 1950 Height 63.0 in. Stopped 02/21/2024 at 5:07:04 AM  Age 70 BMI 28.2 lb./in2 Duration 383.5  Study Type Adult      Raford Sprang, RPSGT Starla Kandy  Reviewed by: Kathe G. Henke, PhD, ABSM, FAASM Sleep Data: Lights Out: 10:39:04 PM Sleep Onset: 11:11:34 PM  Lights On: 5:02:34 AM Sleep Efficiency: 61.3 %  Total Recording Time: 383.5 min Sleep Latency (from Lights Off) 32.5 min  Total Sleep Time (TST): 235.0 min R Latency (from Sleep Onset): 40.0 min  Sleep Period Time: 263.0 min Total number of awakenings: 20  Wake during sleep: 28.0 min Wake After Sleep Onset (WASO): 116.0  min   Sleep Data:         Arousal Summary: Stage  Latency from lights out (min) Latency from sleep onset (min) Duration (min) % Total Sleep Time  Normal values  N 1 32.5 0.0 15.5 6.6 (5%)  N 2 33.5 1.0 64.0 27.2 (50%)  N 3 34.5 2.0 154.5 65.7 (20%)  R 72.5 40.0 1.0 0.4 (25%)   Number Index  Spontaneous 55 14.0  Apneas & Hypopneas 12 3.1  RERAs 0 0.0       (Apneas & Hypopneas & RERAs)  (12) (3.1)  Limb Movement 15 3.8  Snore 0 0.0  TOTAL 82 20.9     Respiratory Data:  CA OA MA Apnea Hypopnea* A+ H RERA Total  Number 0 8 1 9  49 58 0 58  Mean Dur (sec) 0.0 15.4 29.0 16.9 14.1 14.5 0.0 14.5  Max Dur (sec) 0.0 27.0 29.0 29.0 28.0 29.0 0.0 29.0  Total Dur (min) 0.0 2.1 0.5 2.5 11.5 14.0 0.0 14.0  % of TST 0.0 0.9 0.2 1.1 4.9 6.0 0.0 6.0  Index (#/h TST) 0.0 2.0 0.3 2.3 12.5 14.8 0.0 14.8  *Hypopneas scored based on 4% or greater desaturation.  Sleep Stage:        REM NREM TST  AHI 0.0 14.9 14.8  RDI 0.0 14.9 14.8           Body Position Data:  Sleep (min) TST (%) REM (min) NREM (min) CA (#)  OA (#) MA (#) HYP (#) AHI (#/h) RERA (#) RDI (#/h) Desat (#)  Supine 87.0 37.02 0.0 87.0 0 7 1 48 38.6 0 38.6 58  Non-Supine 148.00 62.98 1.00 147.00 0.00 1.00 0.00 1.00 0.81 0 0.81 3.00  Left: 148.0 62.98 1.0 147.0 0 1 0 1 0.8 0 0.8 3     Snoring: Total number of snoring episodes  0  Total time with snoring    min (   % of sleep)   Oximetry Distribution:             WK REM NREM TOTAL  Average (%)   91 93 91 91  < 90% 27.0 0.0 24.8 51.8  < 80% 0.0 0.0 0.0 0.0  < 70% 0.0 0.0 0.0 0.0  # of Desaturations* 1 0 61 62  Desat Index (#/hour) 0.4 0.0 15.6 15.8  Desat Max (%) 3 0 11 11  Desat Max Dur (sec) 25.0 0.0 49.0 49.0  Approx Min O2 during sleep 83  Approx min O2 during a respiratory event 83  Was Oxygen added (Y/N) and final rate :    LPM  *Desaturations based on 3% or greater drop from baseline.   Cheyne Stokes Breathing: None Present   Heart Rate  Summary:  Average Heart Rate During Sleep 65.9 bpm      Highest Heart Rate During Sleep (95th %) 69.0 bpm      Highest Heart Rate During Sleep 110 bpm      Highest Heart Rate During Recording (TIB) 202 bpm (artifact)   Heart Rate Observations: Event Type # Events   Bradycardia 0 Lowest HR Scored: N/A  Sinus Tachycardia During Sleep 0 Highest HR Scored: N/A  Narrow Complex Tachycardia 0 Highest HR Scored: N/A  Wide Complex Tachycardia 0 Highest HR Scored: N/A  Asystole 0 Longest Pause: N/A  Atrial Fibrillation 0 Duration Longest Event: N/A  Other Arrythmias   Type:    Periodic Limb Movement Data: (Primary legs unless otherwise noted) Total # Limb Movement 60 Limb Movement Index 15.3  Total # PLMS 25 PLMS Index 6.4  Total # PLMS Arousals 3 PLMS Arousal Index 0.8  Percentage Sleep Time with PLMS 13.29min (5.7 % sleep)  Mean Duration limb movements (secs) 199.8

## 2024-03-06 ENCOUNTER — Other Ambulatory Visit: Payer: Self-pay | Admitting: Pulmonary Disease

## 2024-03-06 DIAGNOSIS — Q330 Congenital cystic lung: Secondary | ICD-10-CM

## 2024-03-06 DIAGNOSIS — J4540 Moderate persistent asthma, uncomplicated: Secondary | ICD-10-CM
